# Patient Record
Sex: Female | Born: 1955 | Race: White | Hispanic: No | Smoking: Current every day smoker
Health system: Southern US, Community
[De-identification: ages and names within clinical notes are randomized; demographics above are authoritative.]

## PROBLEM LIST (undated history)

## (undated) DIAGNOSIS — I10 Essential (primary) hypertension: Secondary | ICD-10-CM

## (undated) DIAGNOSIS — I8390 Asymptomatic varicose veins of unspecified lower extremity: Secondary | ICD-10-CM

## (undated) DIAGNOSIS — N760 Acute vaginitis: Secondary | ICD-10-CM

## (undated) DIAGNOSIS — F32A Depression, unspecified: Secondary | ICD-10-CM

## (undated) DIAGNOSIS — E079 Disorder of thyroid, unspecified: Secondary | ICD-10-CM

## (undated) DIAGNOSIS — M539 Dorsopathy, unspecified: Secondary | ICD-10-CM

## (undated) DIAGNOSIS — G629 Polyneuropathy, unspecified: Secondary | ICD-10-CM

## (undated) DIAGNOSIS — J309 Allergic rhinitis, unspecified: Secondary | ICD-10-CM

## (undated) DIAGNOSIS — K5732 Diverticulitis of large intestine without perforation or abscess without bleeding: Secondary | ICD-10-CM

## (undated) DIAGNOSIS — R0609 Other forms of dyspnea: Secondary | ICD-10-CM

## (undated) DIAGNOSIS — E039 Hypothyroidism, unspecified: Secondary | ICD-10-CM

## (undated) DIAGNOSIS — R6889 Other general symptoms and signs: Secondary | ICD-10-CM

## (undated) DIAGNOSIS — R251 Tremor, unspecified: Secondary | ICD-10-CM

## (undated) DIAGNOSIS — K589 Irritable bowel syndrome without diarrhea: Secondary | ICD-10-CM

## (undated) DIAGNOSIS — M1990 Unspecified osteoarthritis, unspecified site: Secondary | ICD-10-CM

## (undated) DIAGNOSIS — J439 Emphysema, unspecified: Secondary | ICD-10-CM

## (undated) DIAGNOSIS — M797 Fibromyalgia: Secondary | ICD-10-CM

## (undated) DIAGNOSIS — S0300XA Dislocation of jaw, unspecified side, initial encounter: Secondary | ICD-10-CM

## (undated) DIAGNOSIS — M810 Age-related osteoporosis without current pathological fracture: Secondary | ICD-10-CM

## (undated) DIAGNOSIS — K5790 Diverticulosis of intestine, part unspecified, without perforation or abscess without bleeding: Secondary | ICD-10-CM

## (undated) DIAGNOSIS — G4700 Insomnia, unspecified: Secondary | ICD-10-CM

## (undated) DIAGNOSIS — K219 Gastro-esophageal reflux disease without esophagitis: Secondary | ICD-10-CM

## (undated) DIAGNOSIS — Z9989 Dependence on other enabling machines and devices: Secondary | ICD-10-CM

## (undated) DIAGNOSIS — Z973 Presence of spectacles and contact lenses: Secondary | ICD-10-CM

## (undated) DIAGNOSIS — F329 Major depressive disorder, single episode, unspecified: Secondary | ICD-10-CM

## (undated) DIAGNOSIS — Z803 Family history of malignant neoplasm of breast: Secondary | ICD-10-CM

## (undated) DIAGNOSIS — I2510 Atherosclerotic heart disease of native coronary artery without angina pectoris: Secondary | ICD-10-CM

## (undated) DIAGNOSIS — G8929 Other chronic pain: Secondary | ICD-10-CM

## (undated) DIAGNOSIS — N393 Stress incontinence (female) (male): Secondary | ICD-10-CM

## (undated) DIAGNOSIS — L309 Dermatitis, unspecified: Secondary | ICD-10-CM

## (undated) DIAGNOSIS — Z780 Asymptomatic menopausal state: Secondary | ICD-10-CM

## (undated) HISTORY — PX: REFRACTIVE SURGERY: SHX103

## (undated) HISTORY — DX: Unspecified osteoarthritis, unspecified site: M19.90

## (undated) HISTORY — DX: Hypothyroidism, unspecified: E03.9

## (undated) HISTORY — PX: ABDOMINAL HYSTERECTOMY: SHX81

## (undated) HISTORY — DX: Asymptomatic menopausal state: Z78.0

## (undated) HISTORY — DX: Tremor, unspecified: R25.1

## (undated) HISTORY — DX: Depression, unspecified: F32.A

## (undated) HISTORY — DX: Major depressive disorder, single episode, unspecified: F32.9

## (undated) HISTORY — DX: Insomnia, unspecified: G47.00

## (undated) HISTORY — DX: Family history of malignant neoplasm of breast: Z80.3

## (undated) HISTORY — DX: Atherosclerotic heart disease of native coronary artery without angina pectoris: I25.10

## (undated) HISTORY — DX: Fibromyalgia: M79.7

## (undated) HISTORY — DX: Age-related osteoporosis without current pathological fracture: M81.0

## (undated) HISTORY — DX: Disorder of thyroid, unspecified: E07.9

## (undated) HISTORY — DX: Allergic rhinitis, unspecified: J30.9

## (undated) HISTORY — DX: Acute vaginitis: N76.0

## (undated) HISTORY — PX: EYE SURGERY: SHX253

## (undated) HISTORY — DX: Dislocation of jaw, unspecified side, initial encounter: S03.00XA

## (undated) HISTORY — DX: Asymptomatic varicose veins of unspecified lower extremity: I83.90

## (undated) HISTORY — PX: VARICOSE VEIN SURGERY: SHX832

---

## 1996-02-09 HISTORY — PX: OTHER SURGICAL HISTORY: SHX169

## 2005-02-04 ENCOUNTER — Ambulatory Visit: Payer: Self-pay

## 2006-02-10 ENCOUNTER — Ambulatory Visit: Payer: Self-pay

## 2007-03-16 ENCOUNTER — Ambulatory Visit: Payer: Self-pay

## 2008-03-25 ENCOUNTER — Ambulatory Visit: Payer: Self-pay | Admitting: Obstetrics & Gynecology

## 2009-03-31 ENCOUNTER — Ambulatory Visit: Payer: Self-pay | Admitting: Obstetrics & Gynecology

## 2010-09-16 ENCOUNTER — Ambulatory Visit: Payer: Self-pay

## 2011-10-05 ENCOUNTER — Ambulatory Visit: Payer: Self-pay

## 2012-10-18 ENCOUNTER — Ambulatory Visit: Payer: Self-pay

## 2013-05-11 ENCOUNTER — Ambulatory Visit: Payer: Self-pay | Admitting: Family Medicine

## 2013-05-22 ENCOUNTER — Ambulatory Visit: Payer: Self-pay

## 2013-11-26 ENCOUNTER — Ambulatory Visit: Payer: Self-pay

## 2013-12-12 ENCOUNTER — Ambulatory Visit: Payer: Self-pay

## 2014-03-06 ENCOUNTER — Ambulatory Visit: Payer: Self-pay | Admitting: Neurology

## 2014-05-20 DIAGNOSIS — N951 Menopausal and female climacteric states: Secondary | ICD-10-CM | POA: Insufficient documentation

## 2014-05-20 DIAGNOSIS — F339 Major depressive disorder, recurrent, unspecified: Secondary | ICD-10-CM | POA: Insufficient documentation

## 2014-05-20 DIAGNOSIS — G250 Essential tremor: Secondary | ICD-10-CM | POA: Insufficient documentation

## 2014-05-20 DIAGNOSIS — M810 Age-related osteoporosis without current pathological fracture: Secondary | ICD-10-CM | POA: Insufficient documentation

## 2014-05-20 DIAGNOSIS — M797 Fibromyalgia: Secondary | ICD-10-CM | POA: Insufficient documentation

## 2014-05-20 DIAGNOSIS — G4700 Insomnia, unspecified: Secondary | ICD-10-CM

## 2014-05-20 DIAGNOSIS — R636 Underweight: Secondary | ICD-10-CM | POA: Insufficient documentation

## 2014-05-20 DIAGNOSIS — F332 Major depressive disorder, recurrent severe without psychotic features: Secondary | ICD-10-CM

## 2014-05-20 DIAGNOSIS — Z720 Tobacco use: Secondary | ICD-10-CM

## 2014-05-20 DIAGNOSIS — R87619 Unspecified abnormal cytological findings in specimens from cervix uteri: Secondary | ICD-10-CM

## 2014-05-20 DIAGNOSIS — R5383 Other fatigue: Secondary | ICD-10-CM | POA: Insufficient documentation

## 2014-05-20 DIAGNOSIS — R5382 Chronic fatigue, unspecified: Secondary | ICD-10-CM

## 2014-05-20 DIAGNOSIS — J309 Allergic rhinitis, unspecified: Secondary | ICD-10-CM

## 2014-06-26 ENCOUNTER — Other Ambulatory Visit: Payer: Self-pay | Admitting: Unknown Physician Specialty

## 2014-06-26 DIAGNOSIS — N63 Unspecified lump in unspecified breast: Secondary | ICD-10-CM

## 2014-07-04 ENCOUNTER — Ambulatory Visit
Admission: RE | Admit: 2014-07-04 | Discharge: 2014-07-04 | Disposition: A | Discharge status: Home / Self Care | Payer: No Typology Code available for payment source | Source: Ambulatory Visit | Attending: Unknown Physician Specialty | Admitting: Unknown Physician Specialty

## 2014-07-04 DIAGNOSIS — N63 Unspecified lump in unspecified breast: Secondary | ICD-10-CM

## 2014-07-18 ENCOUNTER — Ambulatory Visit (INDEPENDENT_AMBULATORY_CARE_PROVIDER_SITE_OTHER): Payer: No Typology Code available for payment source | Admitting: Family Medicine

## 2014-07-18 ENCOUNTER — Encounter: Payer: Self-pay | Admitting: Family Medicine

## 2014-07-18 VITALS — BP 116/72 | HR 83 | Temp 97.6°F | Ht 66.3 in | Wt 117.4 lb

## 2014-07-18 DIAGNOSIS — J069 Acute upper respiratory infection, unspecified: Secondary | ICD-10-CM

## 2014-07-18 DIAGNOSIS — J029 Acute pharyngitis, unspecified: Secondary | ICD-10-CM | POA: Diagnosis not present

## 2014-07-18 NOTE — Progress Notes (Signed)
BP 116/72 mmHg  Pulse 83  Temp(Src) 97.6 F (36.4 C)  Ht 5' 6.3" (1.684 m)  Wt 117 lb 6.4 oz (53.252 kg)  BMI 18.78 kg/m2  SpO2 97%  LMP  (LMP Unknown)   Subjective:    Patient ID: Diane Pearson, female    DOB: [birth year], 59 y.o.   MRN: [id number]  HPI: Diane Pearson is a 59 y.o. female presenting on 07/18/2014 for URI and Ear Pain UPPER RESPIRATORY TRACT INFECTION Worst symptom: ear pain and itching for the past 4-5 days Fever: yes 101 on Saturday Cough: yes non-productive Shortness of breath: no Wheezing: no Chest pain: no Chest tightness: no Chest congestion: no Nasal congestion: no Runny nose: yes Post nasal drip: yes Sneezing: no Sore throat: yes Swollen glands: no Sinus pressure: no Headache: no Face pain: no Toothache: yes Ear pain: yes bilateral Ear pressure: yes bilateral Eyes red/itching:no Eye drainage/crusting: no  Vomiting: no Rash: no Fatigue: yes Sick contacts: no Strep contacts: no  Context: better Recurrent sinusitis: no Relief with OTC cold/cough medications: no  Treatments attempted: anti-histamine   Relevant past medical, surgical, family and social history reviewed and updated as indicated. Interim medical history since our last visit reviewed. Allergies and medications reviewed and updated.  Current Outpatient Prescriptions on File Prior to Visit  Medication Sig  . aspirin 81 MG tablet Take 81 mg by mouth daily.  . cholecalciferol (VITAMIN D) 1000 UNITS tablet Take 1,000 Units by mouth daily.  . cyclobenzaprine (FLEXERIL) 10 MG tablet Take 10 mg by mouth 3 (three) times daily as needed for muscle spasms.  . DULoxetine (CYMBALTA) 60 MG capsule Take 60 mg by mouth daily.  Marland Kitchen estrogen, conjugated,-medroxyprogesterone (PREMPRO) 0.625-2.5 MG per tablet Take 1 tablet by mouth daily.  . hydrOXYzine (ATARAX/VISTARIL) 10 MG tablet Take 10 mg by mouth 3 (three) times daily as needed.  . metoprolol succinate (TOPROL-XL) 50 MG 24 hr tablet Take  50 mg by mouth daily. Take with or immediately following a meal.  . vitamin B-12 (CYANOCOBALAMIN) 100 MCG tablet Take 100 mcg by mouth daily.  Marland Kitchen zolpidem (AMBIEN) 10 MG tablet Take 10 mg by mouth at bedtime as needed for sleep.   No current facility-administered medications on file prior to visit.    Review of Systems  Constitutional: Negative.   HENT: Negative.   Respiratory: Negative.   Psychiatric/Behavioral: Negative.     Per HPI unless specifically indicated above     Objective:    BP 116/72 mmHg  Pulse 83  Temp(Src) 97.6 F (36.4 C)  Ht 5' 6.3" (1.684 m)  Wt 117 lb 6.4 oz (53.252 kg)  BMI 18.78 kg/m2  SpO2 97%  LMP  (LMP Unknown)  Wt Readings from Last 3 Encounters:  07/18/14 117 lb 6.4 oz (53.252 kg)  05/20/14 116 lb (52.617 kg)    Physical Exam  Constitutional: She appears well-developed and well-nourished.  HENT:  Head: Normocephalic and atraumatic.  Right Ear: Hearing, tympanic membrane, external ear and ear canal normal.  Left Ear: Hearing, tympanic membrane, external ear and ear canal normal.  Nose: Rhinorrhea present. No mucosal edema, nose lacerations, sinus tenderness, nasal deformity, septal deviation or nasal septal hematoma. No epistaxis.  No foreign bodies. Right sinus exhibits maxillary sinus tenderness. Right sinus exhibits no frontal sinus tenderness. Left sinus exhibits maxillary sinus tenderness. Left sinus exhibits no frontal sinus tenderness.  Mouth/Throat: Uvula is midline. Mucous membranes are pale, not dry and not cyanotic. No oropharyngeal exudate, posterior oropharyngeal  edema, posterior oropharyngeal erythema or tonsillar abscesses.  Slight erythema, turbinates not swollen, swollen tonsil on the R with white exudate  Eyes: Conjunctivae and EOM are normal. Pupils are equal, round, and reactive to light.  Neck: Normal range of motion. No JVD present. No tracheal deviation present. No thyromegaly present.  Cardiovascular: Normal rate, regular  rhythm, normal heart sounds and intact distal pulses.  Exam reveals no gallop and no friction rub.   No murmur heard. Pulmonary/Chest: Effort normal and breath sounds normal. No stridor. No respiratory distress. She has no wheezes. She has no rales. She exhibits no tenderness.  Lymphadenopathy:    She has cervical adenopathy.  Skin: Skin is warm and dry.  Psychiatric: She has a normal mood and affect. Her behavior is normal. Judgment and thought content normal.    No results found for this or any previous visit.    Assessment & Plan:   Problem List Items Addressed This Visit    None    Visit Diagnoses    Upper respiratory infection    -  Primary    This appears to be a viral illness. Symptomatic treatment as discussed below. Call if not getting better or getting worse.     Pharyngitis        Strep test checked today and was negative. Likely due to PND    Relevant Orders    Strep Gp A Ag, IA W/Reflex        Follow up plan: Return if symptoms worsen or fail to improve.

## 2014-07-18 NOTE — Patient Instructions (Signed)
Upper Respiratory Infection, Adult An upper respiratory infection (URI) is also sometimes known as the common cold. The upper respiratory tract includes the nose, sinuses, throat, trachea, and bronchi. Bronchi are the airways leading to the lungs. Most people improve within 1 week, but symptoms can last up to 2 weeks. A residual cough may last even longer.  CAUSES Many different viruses can infect the tissues lining the upper respiratory tract. The tissues become irritated and inflamed and often become very moist. Mucus production is also common. A cold is contagious. You can easily spread the virus to others by oral contact. This includes kissing, sharing a glass, coughing, or sneezing. Touching your mouth or nose and then touching a surface, which is then touched by another person, can also spread the virus. SYMPTOMS  Symptoms typically develop 1 to 3 days after you come in contact with a cold virus. Symptoms vary from person to person. They may include:  Runny nose.  Sneezing.  Nasal congestion.  Sinus irritation.  Sore throat.  Loss of voice (laryngitis).  Cough.  Fatigue.  Muscle aches.  Loss of appetite.  Headache.  Low-grade fever. DIAGNOSIS  You might diagnose your own cold based on familiar symptoms, since most people get a cold 2 to 3 times a year. Your caregiver can confirm this based on your exam. Most importantly, your caregiver can check that your symptoms are not due to another disease such as strep throat, sinusitis, pneumonia, asthma, or epiglottitis. Blood tests, throat tests, and X-rays are not necessary to diagnose a common cold, but they may sometimes be helpful in excluding other more serious diseases. Your caregiver will decide if any further tests are required. RISKS AND COMPLICATIONS  You may be at risk for a more severe case of the common cold if you smoke cigarettes, have chronic heart disease (such as heart failure) or lung disease (such as asthma), or if  you have a weakened immune system. The very young and very old are also at risk for more serious infections. Bacterial sinusitis, middle ear infections, and bacterial pneumonia can complicate the common cold. The common cold can worsen asthma and chronic obstructive pulmonary disease (COPD). Sometimes, these complications can require emergency medical care and may be life-threatening. PREVENTION  The best way to protect against getting a cold is to practice good hygiene. Avoid oral or hand contact with people with cold symptoms. Wash your hands often if contact occurs. There is no clear evidence that vitamin C, vitamin E, echinacea, or exercise reduces the chance of developing a cold. However, it is always recommended to get plenty of rest and practice good nutrition. TREATMENT  Treatment is directed at relieving symptoms. There is no cure. Antibiotics are not effective, because the infection is caused by a virus, not by bacteria. Treatment may include:  Increased fluid intake. Sports drinks offer valuable electrolytes, sugars, and fluids.  Breathing heated mist or steam (vaporizer or shower).  Eating chicken soup or other clear broths, and maintaining good nutrition.  Getting plenty of rest.  Using gargles or lozenges for comfort.  Controlling fevers with ibuprofen or acetaminophen as directed by your caregiver.  Increasing usage of your inhaler if you have asthma. Zinc gel and zinc lozenges, taken in the first 24 hours of the common cold, can shorten the duration and lessen the severity of symptoms. Pain medicines may help with fever, muscle aches, and throat pain. A variety of non-prescription medicines are available to treat congestion and runny nose. Your caregiver   can make recommendations and may suggest nasal or lung inhalers for other symptoms.  HOME CARE INSTRUCTIONS   Only take over-the-counter or prescription medicines for pain, discomfort, or fever as directed by your  caregiver.  Use a warm mist humidifier or inhale steam from a shower to increase air moisture. This may keep secretions moist and make it easier to breathe.  Drink enough water and fluids to keep your urine clear or pale yellow.  Rest as needed.  Return to work when your temperature has returned to normal or as your caregiver advises. You may need to stay home longer to avoid infecting others. You can also use a face mask and careful hand washing to prevent spread of the virus. SEEK MEDICAL CARE IF:   After the first few days, you feel you are getting worse rather than better.  You need your caregiver's advice about medicines to control symptoms.  You develop chills, worsening shortness of breath, or brown or red sputum. These may be signs of pneumonia.  You develop yellow or brown nasal discharge or pain in the face, especially when you bend forward. These may be signs of sinusitis.  You develop a fever, swollen neck glands, pain with swallowing, or white areas in the back of your throat. These may be signs of strep throat. SEEK IMMEDIATE MEDICAL CARE IF:   You have a fever.  You develop severe or persistent headache, ear pain, sinus pain, or chest pain.  You develop wheezing, a prolonged cough, cough up blood, or have a change in your usual mucus (if you have chronic lung disease).  You develop sore muscles or a stiff neck. Document Released: 07/21/2000 Document Revised: 04/19/2011 Document Reviewed: 05/02/2013 Exit[employer] Patient Information 2015 [employer]. This information is not intended to replace advice given to you by your health care provider. Make sure you discuss any questions you have with your health care provider.  

## 2014-07-21 LAB — STREP GP A AG, IA W/REFLEX: Strep A Culture: NEGATIVE

## 2014-08-06 ENCOUNTER — Telehealth: Payer: Self-pay | Admitting: Unknown Physician Specialty

## 2014-08-06 DIAGNOSIS — M797 Fibromyalgia: Secondary | ICD-10-CM

## 2014-08-06 NOTE — Telephone Encounter (Signed)
Called and let patient know about rheumatology labs. I also let her know that [name] is going to put in for the referral she requested.

## 2014-08-06 NOTE — Telephone Encounter (Signed)
Tried to return patient's call but there was no answer so I left a voicemail for her to return my call.

## 2014-08-06 NOTE — Telephone Encounter (Signed)
Patient returned my call. She got her results from Rheumatology and does not understand the results so she wants clarification on them. She would also like to be referred to Dr. Sandi Mealy or Dr. Helane Rima at [facility]. Patient would like a call back at [phone].

## 2014-08-06 NOTE — Telephone Encounter (Signed)
Please let pt know that she should call her rheumatologist for clarification on her labs.  They can best help her as I often don't know what rheumatology labs indicate.  OK for the referral.  I will put it in.

## 2014-08-06 NOTE — Telephone Encounter (Signed)
Pt called requests call back from [place]. Please contact pt @ [phone]. Thanks.

## 2014-08-13 ENCOUNTER — Other Ambulatory Visit: Payer: Self-pay | Admitting: Rheumatology

## 2014-08-13 DIAGNOSIS — M542 Cervicalgia: Secondary | ICD-10-CM

## 2014-08-28 ENCOUNTER — Ambulatory Visit
Admission: RE | Admit: 2014-08-28 | Discharge: 2014-08-28 | Disposition: A | Discharge status: Home / Self Care | Payer: No Typology Code available for payment source | Source: Ambulatory Visit | Attending: Rheumatology | Admitting: Rheumatology

## 2014-08-28 DIAGNOSIS — M5022 Other cervical disc displacement, mid-cervical region: Secondary | ICD-10-CM | POA: Insufficient documentation

## 2014-08-28 DIAGNOSIS — M542 Cervicalgia: Secondary | ICD-10-CM

## 2014-08-28 DIAGNOSIS — M4802 Spinal stenosis, cervical region: Secondary | ICD-10-CM | POA: Insufficient documentation

## 2014-08-28 DIAGNOSIS — M2578 Osteophyte, vertebrae: Secondary | ICD-10-CM | POA: Insufficient documentation

## 2014-09-05 ENCOUNTER — Telehealth: Payer: Self-pay | Admitting: Unknown Physician Specialty

## 2014-09-05 NOTE — Telephone Encounter (Signed)
Called and spoke to patient.

## 2014-09-05 NOTE — Telephone Encounter (Signed)
Pt called stated someone called her, would like a call back. Thanks.

## 2014-09-25 ENCOUNTER — Encounter: Payer: Self-pay | Admitting: Unknown Physician Specialty

## 2014-09-25 ENCOUNTER — Ambulatory Visit (INDEPENDENT_AMBULATORY_CARE_PROVIDER_SITE_OTHER): Payer: No Typology Code available for payment source | Admitting: Unknown Physician Specialty

## 2014-09-25 VITALS — BP 130/86 | HR 94 | Temp 98.3°F | Ht 66.0 in | Wt 117.4 lb

## 2014-09-25 DIAGNOSIS — G629 Polyneuropathy, unspecified: Secondary | ICD-10-CM | POA: Insufficient documentation

## 2014-09-25 DIAGNOSIS — M797 Fibromyalgia: Secondary | ICD-10-CM | POA: Diagnosis not present

## 2014-09-25 DIAGNOSIS — Z0000 Encounter for general adult medical examination without abnormal findings: Secondary | ICD-10-CM | POA: Diagnosis not present

## 2014-09-25 DIAGNOSIS — G250 Essential tremor: Secondary | ICD-10-CM | POA: Diagnosis not present

## 2014-09-25 NOTE — Progress Notes (Signed)
BP 130/86 mmHg  Pulse 94  Temp(Src) 98.3 F (36.8 C)  Ht 5\' 6"  (1.676 m)  Wt 117 lb 6.4 oz (53.252 kg)  BMI 18.96 kg/m2  SpO2 96%  LMP  (LMP Unknown)   Subjective:    Patient ID: Diane Pearson, female    DOB: 17-Jul-1955, 58 y.o.   MRN: [id number]  HPI: Diane Pearson is a 59 y.o. female  Chief Complaint  Patient presents with  . Annual Exam   Fibromyalgia Pt is looking for another rheumatologist.  I did review her MRI of the c-spine.  She has a list of doctors she got from the doctor who she is seeing for disability.  She is unable to work at her job due to tremors, mind fog, inability to stand for 8 hours or sit for a prolonged period of time.  Sometimes she can't walk or bend over due to back pain.  She needs rest to manage pain and tremors and unable to do that with the rigors of a full-time job.  Sometimes she can't lift stuff, she drops stuff, and trips.  Unable to see well but has seen an eye specialist.  She has her MRI of the neck showing a great deal of pathology.    Relevant past medical, surgical, family and social history reviewed and updated as indicated. Interim medical history since our last visit reviewed. Allergies and medications reviewed and updated.  Review of Systems  Constitutional: Positive for fatigue.  HENT: Negative.   Eyes: Positive for visual disturbance.  Respiratory: Negative.   Cardiovascular: Negative.   Gastrointestinal:       Diarrhea is improving  Endocrine: Positive for cold intolerance and polyuria.  Genitourinary: Negative.   Musculoskeletal: Positive for myalgias, back pain, arthralgias, neck pain and neck stiffness.  Neurological: Positive for dizziness, tremors, weakness, light-headedness and numbness.  Psychiatric/Behavioral:       Depression.  Getting frustrated easily    Per HPI unless specifically indicated above     Objective:    BP 130/86 mmHg  Pulse 94  Temp(Src) 98.3 F (36.8 C)  Ht 5\' 6"  (1.676 m)  Wt 117 lb  6.4 oz (53.252 kg)  BMI 18.96 kg/m2  SpO2 96%  LMP  (LMP Unknown)  Wt Readings from Last 3 Encounters:  09/25/14 117 lb 6.4 oz (53.252 kg)  08/28/14 117 lb (53.071 kg)  07/18/14 117 lb 6.4 oz (53.252 kg)    Physical Exam  Constitutional: She is oriented to person, place, and time. She appears well-developed and well-nourished. No distress.  HENT:  Head: Normocephalic and atraumatic.  Eyes: Conjunctivae and lids are normal. Pupils are equal, round, and reactive to light. Right eye exhibits no discharge. Left eye exhibits no discharge. No scleral icterus.  Neck: Normal range of motion. Neck supple. Carotid bruit is not present. No thyromegaly present.  Cardiovascular: Normal rate, regular rhythm and normal heart sounds.  Exam reveals no gallop and no friction rub.   No murmur heard. Pulmonary/Chest: Effort normal and breath sounds normal. No respiratory distress. She has no wheezes. She has no rales.  Abdominal: Soft. Normal appearance and bowel sounds are normal. She exhibits no distension. There is no splenomegaly or hepatomegaly. There is no tenderness. There is no rebound.  Genitourinary: No breast swelling, tenderness or discharge.  Musculoskeletal: Normal range of motion.  Lymphadenopathy:    She has no cervical adenopathy.  Neurological: She is alert and oriented to person, place, and time.  Skin: Skin is  warm, dry and intact. No rash noted. No pallor.  Psychiatric: She has a normal mood and affect. Her speech is normal and behavior is normal. Judgment and thought content normal. Cognition and memory are normal.  Nursing note and vitals reviewed.     Assessment & Plan:   Problem List Items Addressed This Visit      Unprioritized   Benign essential tremor   Fibromyalgia - Primary   Relevant Medications   ibuprofen (ADVIL,MOTRIN) 800 MG tablet   Other Relevant Orders   TSH   Vit D  25 hydroxy (rtn osteoporosis monitoring)   Neuropathy   Relevant Orders   Vit D  25  hydroxy (rtn osteoporosis monitoring)   Vitamin B12    Other Visit Diagnoses    Annual physical exam        Relevant Orders    CBC with Differential/Platelet    Comprehensive metabolic panel    Hepatitis C antibody    HIV antibody    Lipid Panel w/o Chol/HDL Ratio    TSH    Vit D  25 hydroxy (rtn osteoporosis monitoring)        Follow up plan:  Refusing colonoscopy.  Will do stool cards.   Needs pap with HR HPV in 6 months.

## 2014-09-26 ENCOUNTER — Telehealth: Payer: Self-pay | Admitting: Unknown Physician Specialty

## 2014-09-26 LAB — CBC WITH DIFFERENTIAL/PLATELET
Basophils Absolute: 0.1 10*3/uL (ref 0.0–0.2)
Basos: 1 %
EOS (ABSOLUTE): 0.4 10*3/uL (ref 0.0–0.4)
Eos: 5 %
Hematocrit: 36 % (ref 34.0–46.6)
Hemoglobin: 12.3 g/dL (ref 11.1–15.9)
Immature Grans (Abs): 0 10*3/uL (ref 0.0–0.1)
Immature Granulocytes: 0 %
Lymphocytes Absolute: 2.4 10*3/uL (ref 0.7–3.1)
Lymphs: 32 %
MCH: 32.3 pg (ref 26.6–33.0)
MCHC: 34.2 g/dL (ref 31.5–35.7)
MCV: 95 fL (ref 79–97)
Monocytes Absolute: 0.4 10*3/uL (ref 0.1–0.9)
Monocytes: 5 %
Neutrophils Absolute: 4.3 10*3/uL (ref 1.4–7.0)
Neutrophils: 57 %
Platelets: 235 10*3/uL (ref 150–379)
RBC: 3.81 x10E6/uL (ref 3.77–5.28)
RDW: 13.1 % (ref 12.3–15.4)
WBC: 7.5 10*3/uL (ref 3.4–10.8)

## 2014-09-26 LAB — COMPREHENSIVE METABOLIC PANEL
ALT: 13 IU/L (ref 0–32)
AST: 17 IU/L (ref 0–40)
Albumin/Globulin Ratio: 1.8 (ref 1.1–2.5)
Albumin: 4.2 g/dL (ref 3.5–5.5)
Alkaline Phosphatase: 73 IU/L (ref 39–117)
BUN/Creatinine Ratio: 25 — ABNORMAL HIGH (ref 9–23)
BUN: 17 mg/dL (ref 6–24)
Bilirubin Total: <0.2 mg/dL (ref 0.0–1.2)
CO2: 28 mmol/L (ref 18–29)
Calcium: 9.4 mg/dL (ref 8.7–10.2)
Chloride: 100 mmol/L (ref 97–108)
Creatinine, Ser: 0.69 mg/dL (ref 0.57–1.00)
GFR calc Af Amer: 112 mL/min/{1.73_m2} (ref >59)
GFR calc non Af Amer: 97 mL/min/{1.73_m2} (ref >59)
Globulin, Total: 2.4 g/dL (ref 1.5–4.5)
Glucose: 58 mg/dL — ABNORMAL LOW (ref 65–99)
Potassium: 4.2 mmol/L (ref 3.5–5.2)
Sodium: 140 mmol/L (ref 134–144)
Total Protein: 6.6 g/dL (ref 6.0–8.5)

## 2014-09-26 LAB — HIV ANTIBODY (ROUTINE TESTING W REFLEX): HIV Screen 4th Generation wRfx: NONREACTIVE

## 2014-09-26 LAB — HEPATITIS C ANTIBODY: Hep C Virus Ab: <0.1 s/co ratio (ref 0.0–0.9)

## 2014-09-26 LAB — VITAMIN D 25 HYDROXY (VIT D DEFICIENCY, FRACTURES): Vit D, 25-Hydroxy: 45.4 ng/mL (ref 30.0–100.0)

## 2014-09-26 LAB — LIPID PANEL W/O CHOL/HDL RATIO
Cholesterol, Total: 155 mg/dL (ref 100–199)
HDL: 40 mg/dL (ref >39)
LDL Calculated: 85 mg/dL (ref 0–99)
Triglycerides: 150 mg/dL — ABNORMAL HIGH (ref 0–149)
VLDL Cholesterol Cal: 30 mg/dL (ref 5–40)

## 2014-09-26 LAB — TSH: TSH: 2.19 u[IU]/mL (ref 0.450–4.500)

## 2014-09-26 LAB — VITAMIN B12: Vitamin B-12: 1279 pg/mL — ABNORMAL HIGH (ref 211–946)

## 2014-09-27 ENCOUNTER — Encounter: Payer: Self-pay | Admitting: Unknown Physician Specialty

## 2014-10-21 ENCOUNTER — Other Ambulatory Visit: Payer: Self-pay | Admitting: Unknown Physician Specialty

## 2014-10-21 NOTE — Telephone Encounter (Signed)
Last bp and pulse reviewed; rx approved 

## 2014-11-20 ENCOUNTER — Other Ambulatory Visit: Payer: Self-pay | Admitting: Unknown Physician Specialty

## 2014-11-20 ENCOUNTER — Other Ambulatory Visit: Payer: Self-pay

## 2014-11-20 MED ORDER — ZOLPIDEM TARTRATE 10 MG PO TABS: 10.0000 mg | ORAL_TABLET | Freq: Every evening | ORAL | Status: DC | PRN

## 2014-11-20 MED ORDER — CYCLOBENZAPRINE HCL 10 MG PO TABS: 10.0000 mg | ORAL_TABLET | Freq: Three times a day (TID) | ORAL | Status: DC | PRN

## 2014-11-20 MED ORDER — DULOXETINE HCL 60 MG PO CPEP: 60.0000 mg | ORAL_CAPSULE | Freq: Every day | ORAL | Status: DC

## 2014-11-20 NOTE — Telephone Encounter (Signed)
Patient was last seen 09/25/14.

## 2014-11-20 NOTE — Telephone Encounter (Signed)
Pt came in stated she needs refills on Duloxetine and Cyclobenzaprine. Pharm is [facility], Fax # [phone].

## 2014-11-20 NOTE — Telephone Encounter (Signed)
Patient was last seen 09/25/14 and pharmacy is [facility].

## 2014-11-27 ENCOUNTER — Other Ambulatory Visit: Payer: Self-pay | Admitting: Unknown Physician Specialty

## 2014-11-27 DIAGNOSIS — R928 Other abnormal and inconclusive findings on diagnostic imaging of breast: Secondary | ICD-10-CM

## 2015-01-06 ENCOUNTER — Other Ambulatory Visit: Payer: Self-pay | Admitting: Unknown Physician Specialty

## 2015-01-13 ENCOUNTER — Telehealth: Payer: Self-pay | Admitting: Unknown Physician Specialty

## 2015-01-13 NOTE — Telephone Encounter (Signed)
Called patient's medication into the pharmacy.

## 2015-01-13 NOTE — Telephone Encounter (Signed)
Called and left patient a voicemail letting her know that her prescription was called into her pharmacy.

## 2015-01-13 NOTE — Telephone Encounter (Signed)
Routing to provider. This was refilled on 01/06/15 and was printed but there is nothing up front. Can it be re-printed?

## 2015-01-13 NOTE — Telephone Encounter (Signed)
Patient called needing refill on her medications please call her zolpidem (AMBIEN) 10 MG tablet [phone] [place]- her pharmacy

## 2015-01-13 NOTE — Telephone Encounter (Signed)
If it is written, it can be called into the pharmacy.  Thanks

## 2015-03-03 ENCOUNTER — Ambulatory Visit (INDEPENDENT_AMBULATORY_CARE_PROVIDER_SITE_OTHER): Payer: BLUE CROSS/BLUE SHIELD | Admitting: Unknown Physician Specialty

## 2015-03-03 ENCOUNTER — Encounter: Payer: Self-pay | Admitting: Unknown Physician Specialty

## 2015-03-03 VITALS — BP 151/87 | HR 79 | Temp 97.7°F | Ht 65.5 in | Wt 136.4 lb

## 2015-03-03 DIAGNOSIS — Z8742 Personal history of other diseases of the female genital tract: Secondary | ICD-10-CM | POA: Insufficient documentation

## 2015-03-03 DIAGNOSIS — Z0000 Encounter for general adult medical examination without abnormal findings: Secondary | ICD-10-CM | POA: Diagnosis not present

## 2015-03-03 DIAGNOSIS — R928 Other abnormal and inconclusive findings on diagnostic imaging of breast: Secondary | ICD-10-CM

## 2015-03-03 DIAGNOSIS — M797 Fibromyalgia: Secondary | ICD-10-CM

## 2015-03-03 DIAGNOSIS — Z720 Tobacco use: Secondary | ICD-10-CM | POA: Diagnosis not present

## 2015-03-03 NOTE — Assessment & Plan Note (Signed)
Low dose CT ordered 

## 2015-03-03 NOTE — Assessment & Plan Note (Signed)
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  Pap plus HPV today

## 2015-03-03 NOTE — Assessment & Plan Note (Signed)
Overdue for 6 month f/u

## 2015-03-03 NOTE — Patient Instructions (Signed)
Please do call to schedule your mammogram; the number to schedule one at either [facility] or [facility] is [phone]   

## 2015-03-03 NOTE — Progress Notes (Signed)
BP 151/87 mmHg  Pulse 79  Temp(Src) 97.7 F (36.5 C)  Ht 5' 5.5" (1.664 m)  Wt 136 lb 6.4 oz (61.871 kg)  BMI 22.35 kg/m2  SpO2 95%  LMP  (LMP Unknown)   Subjective:    Patient ID: Diane Pearson, female    DOB: 1955-08-09, 60 y.o.   MRN: [id number]  HPI: Diane Pearson is a 60 y.o. female  Chief Complaint  Patient presents with  . Fibromyalgia  . other    pt is here for pap according to last OV note   Pt needs a pap smear.    Fibromyalgia Pt was under the care of a rheumatologist who recommended Lyrica but unable to get it approved until recently.  Now taking it and thinks it has helped a little. She does get a headache but finds essential oils are helping that her daughter sells.   Her tremors are "good and bad" and seem to be related to Fibromyalgia.  She is taking Flexeril at night  Reviewed notes from Rheumatology and plan is as follows:  Would start Lyrica 25 mg 2 times per day. Extending to the patient that this is a small dose and we would be likely to titrate up in the future. - For periods of aching in the knees and hands a trilevel her and gel suggested. This was prescribed. - A referral was placed for physical therapy. - We discussed important diet and lifestyle changes to benefit fibromyalgia symptoms. - Future considerations include Topamax and referral to pain psychology. The patient defers on pain psychology right now. Would start Lyrica 25 mg 2 times per day. Extending to the patient that this is a small dose and we would be likely to titrate up in the future. - For periods of aching in the knees and hands a trilevel her and gel suggested. This was prescribed. - A referral was placed for physical therapy. - We discussed important diet and lifestyle changes to benefit fibromyalgia symptoms. - Future considerations include Topamax and referral to pain psychology. The patient defers on pain psychology right now.    Unable to work due to tremors, fatigue and  pain.    Needs f/u pap.     Relevant past medical, surgical, family and social history reviewed and updated as indicated. Interim medical history since our last visit reviewed. Allergies and medications reviewed and updated.  Review of Systems  Constitutional: Positive for fatigue.       Sleeping better on 1/2 Ambien  HENT: Negative.   Eyes: Negative.   Respiratory: Negative.   Cardiovascular: Negative.   Gastrointestinal: Negative.   Musculoskeletal:       As above and having trouble with knees     Neurological:       Essential tremors  Hematological: Negative.   Psychiatric/Behavioral: Negative.     Per HPI unless specifically indicated above     Objective:    BP 151/87 mmHg  Pulse 79  Temp(Src) 97.7 F (36.5 C)  Ht 5' 5.5" (1.664 m)  Wt 136 lb 6.4 oz (61.871 kg)  BMI 22.35 kg/m2  SpO2 95%  LMP  (LMP Unknown)  Wt Readings from Last 3 Encounters:  03/03/15 136 lb 6.4 oz (61.871 kg)  09/25/14 117 lb 6.4 oz (53.252 kg)  08/28/14 117 lb (53.071 kg)    Physical Exam  Constitutional: She is oriented to person, place, and time. She appears well-developed and well-nourished.  HENT:  Head: Normocephalic and atraumatic.  Eyes:  Pupils are equal, round, and reactive to light. Right eye exhibits no discharge. Left eye exhibits no discharge. No scleral icterus.  Neck: Normal range of motion. Neck supple. Carotid bruit is not present. No thyromegaly present.  Cardiovascular: Normal rate, regular rhythm and normal heart sounds.  Exam reveals no gallop and no friction rub.   No murmur heard. Pulmonary/Chest: Effort normal and breath sounds normal. No respiratory distress. She has no wheezes. She has no rales.  Abdominal: Soft. Bowel sounds are normal. There is no tenderness. There is no rebound.  Genitourinary: No breast swelling, tenderness or discharge.  Musculoskeletal: Normal range of motion.  Lymphadenopathy:    She has no cervical adenopathy.  Neurological: She is  alert and oriented to person, place, and time.  Skin: Skin is warm, dry and intact. No rash noted.  Psychiatric: She has a normal mood and affect. Her speech is normal and behavior is normal. Judgment and thought content normal. Cognition and memory are normal.    Results for orders placed or performed in visit on 09/25/14  CBC with Differential/Platelet  Result Value Ref Range   WBC 7.5 3.4 - 10.8 x10E3/uL   RBC 3.81 3.77 - 5.28 x10E6/uL   Hemoglobin 12.3 11.1 - 15.9 g/dL   Hematocrit 36.0 34.0 - 46.6 %   MCV 95 79 - 97 fL   MCH 32.3 26.6 - 33.0 pg   MCHC 34.2 31.5 - 35.7 g/dL   RDW 13.1 12.3 - 15.4 %   Platelets 235 150 - 379 x10E3/uL   Neutrophils 57 %   Lymphs 32 %   Monocytes 5 %   Eos 5 %   Basos 1 %   Neutrophils Absolute 4.3 1.4 - 7.0 x10E3/uL   Lymphocytes Absolute 2.4 0.7 - 3.1 x10E3/uL   Monocytes Absolute 0.4 0.1 - 0.9 x10E3/uL   EOS (ABSOLUTE) 0.4 0.0 - 0.4 x10E3/uL   Basophils Absolute 0.1 0.0 - 0.2 x10E3/uL   Immature Granulocytes 0 %   Immature Grans (Abs) 0.0 0.0 - 0.1 x10E3/uL  Comprehensive metabolic panel  Result Value Ref Range   Glucose 58 (L) 65 - 99 mg/dL   BUN 17 6 - 24 mg/dL   Creatinine, Ser 0.69 0.57 - 1.00 mg/dL   GFR calc non Af Amer 97 >59 mL/min/1.73   GFR calc Af Amer 112 >59 mL/min/1.73   BUN/Creatinine Ratio 25 (H) 9 - 23   Sodium 140 134 - 144 mmol/L   Potassium 4.2 3.5 - 5.2 mmol/L   Chloride 100 97 - 108 mmol/L   CO2 28 18 - 29 mmol/L   Calcium 9.4 8.7 - 10.2 mg/dL   Total Protein 6.6 6.0 - 8.5 g/dL   Albumin 4.2 3.5 - 5.5 g/dL   Globulin, Total 2.4 1.5 - 4.5 g/dL   Albumin/Globulin Ratio 1.8 1.1 - 2.5   Bilirubin Total <0.2 0.0 - 1.2 mg/dL   Alkaline Phosphatase 73 39 - 117 IU/L   AST 17 0 - 40 IU/L   ALT 13 0 - 32 IU/L  Hepatitis C antibody  Result Value Ref Range   Hep C Virus Ab <0.1 0.0 - 0.9 s/co ratio  HIV antibody  Result Value Ref Range   HIV Screen 4th Generation wRfx Non Reactive Non Reactive  Lipid Panel w/o  Chol/HDL Ratio  Result Value Ref Range   Cholesterol, Total 155 100 - 199 mg/dL   Triglycerides 150 (H) 0 - 149 mg/dL   HDL 40 >39 mg/dL   VLDL Cholesterol  Cal 30 5 - 40 mg/dL   LDL Calculated 85 0 - 99 mg/dL  TSH  Result Value Ref Range   TSH 2.190 0.450 - 4.500 uIU/mL  Vit D  25 hydroxy (rtn osteoporosis monitoring)  Result Value Ref Range   Vit D, 25-Hydroxy 45.4 30.0 - 100.0 ng/mL  Vitamin B12  Result Value Ref Range   Vitamin B-12 1279 (H) 211 - 946 pg/mL      Assessment & Plan:   Problem List Items Addressed This Visit      Unprioritized   Fibromyalgia    Still a major problem and unable to work due to symptoms.  She has also been evaluated by a rheumatologist.  I'd like to see how the lyriica works before making any changes.  Only on 25 mg TID and will consider increasing after another month      Tobacco use    Low dose CT ordered       History of abnormal cervical Pap smear    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  Pap plus HPV today      Abnormal mammogram    Overdue for 6 month f/u       Other Visit Diagnoses    Routine general medical examination at a health care facility    -  Primary    Relevant Orders    IGP, Aptima HPV, rfx 16/18,45       Pt is refusing PT for now and wants to try the chiropractor  Follow up plan: Return in about 1 month (around 04/03/2015). to consider increasing Lyrica

## 2015-03-03 NOTE — Assessment & Plan Note (Addendum)
Still a major problem and unable to work due to symptoms.  She has also been evaluated by a rheumatologist.  I'd like to see how the lyriica works before making any changes.  Only on 25 mg TID and will consider increasing after another month

## 2015-03-04 ENCOUNTER — Telehealth: Payer: Self-pay | Admitting: *Deleted

## 2015-03-04 NOTE — Telephone Encounter (Signed)
Received referral for low dose lung cancer screening CT scan from [facility] . Voicemail left at phone number listed in EMR for patient to call me back to facilitate scheduling scan.

## 2015-03-06 ENCOUNTER — Other Ambulatory Visit: Payer: Self-pay | Admitting: Unknown Physician Specialty

## 2015-03-06 DIAGNOSIS — Z1231 Encounter for screening mammogram for malignant neoplasm of breast: Secondary | ICD-10-CM

## 2015-03-08 LAB — IGP, APTIMA HPV, RFX 16/18,45
HPV Aptima: NEGATIVE
PAP Smear Comment: 0

## 2015-03-11 ENCOUNTER — Telehealth: Payer: Self-pay

## 2015-03-11 NOTE — Telephone Encounter (Signed)
Called and let patient know what Cheryl said.  

## 2015-03-11 NOTE — Telephone Encounter (Signed)
She will need to ask her insurance that ?Marland Kitchen  Typically not

## 2015-03-11 NOTE — Telephone Encounter (Signed)
Called patient to let her know about pap results. Patient asked about going to a therapist for her fibromyalgia. She stated she has done this in the past and wants to know if she needs a new referral.

## 2015-03-14 ENCOUNTER — Telehealth: Payer: Self-pay | Admitting: Unknown Physician Specialty

## 2015-03-14 NOTE — Telephone Encounter (Signed)
Called and left patient a voicemail letting her know that her prescription was printed and can be picked up and taken to her pharmacy to get filled.

## 2015-03-14 NOTE — Telephone Encounter (Signed)
Pt called to inquire to about refill on Ambien. Thanks.

## 2015-03-15 ENCOUNTER — Other Ambulatory Visit: Payer: Self-pay | Admitting: Unknown Physician Specialty

## 2015-03-17 ENCOUNTER — Telehealth: Payer: Self-pay | Admitting: *Deleted

## 2015-03-17 NOTE — Telephone Encounter (Signed)
Received referral for low dose lung cancer screening CT scan from [facility]. Spoke with patient at length discussing the purpose of and the details of low dose screening scan. Patient will think about having it done and will contact me in the future if she decides to have screening scan.

## 2015-03-18 ENCOUNTER — Ambulatory Visit
Admission: RE | Admit: 2015-03-18 | Discharge: 2015-03-18 | Disposition: A | Discharge status: Home / Self Care | Payer: BLUE CROSS/BLUE SHIELD | Source: Ambulatory Visit | Attending: Unknown Physician Specialty | Admitting: Unknown Physician Specialty

## 2015-03-18 DIAGNOSIS — Z1231 Encounter for screening mammogram for malignant neoplasm of breast: Secondary | ICD-10-CM | POA: Diagnosis not present

## 2015-03-28 ENCOUNTER — Ambulatory Visit: Payer: No Typology Code available for payment source | Admitting: Unknown Physician Specialty

## 2015-03-31 ENCOUNTER — Other Ambulatory Visit: Payer: Self-pay | Admitting: Unknown Physician Specialty

## 2015-04-04 ENCOUNTER — Ambulatory Visit (INDEPENDENT_AMBULATORY_CARE_PROVIDER_SITE_OTHER): Payer: BLUE CROSS/BLUE SHIELD | Admitting: Unknown Physician Specialty

## 2015-04-04 ENCOUNTER — Encounter: Payer: Self-pay | Admitting: Unknown Physician Specialty

## 2015-04-04 VITALS — BP 146/87 | HR 80 | Temp 98.4°F | Ht 65.7 in | Wt 134.6 lb

## 2015-04-04 DIAGNOSIS — M797 Fibromyalgia: Secondary | ICD-10-CM

## 2015-04-04 MED ORDER — PREGABALIN 50 MG PO CAPS: 50.0000 mg | ORAL_CAPSULE | Freq: Two times a day (BID) | ORAL | Status: DC

## 2015-04-04 NOTE — Assessment & Plan Note (Signed)
Increase Lyrica to 50 mg TID.

## 2015-04-04 NOTE — Progress Notes (Signed)
   BP 146/87 mmHg  Pulse 80  Temp(Src) 98.4 F (36.9 C)  Ht 5' 5.7" (1.669 m)  Wt 134 lb 9.6 oz (61.054 kg)  BMI 21.92 kg/m2  SpO2 97%  LMP  (LMP Unknown)   Subjective:    Patient ID: Diane Pearson, female    DOB: 1955/08/18, 60 y.o.   MRN: [id number]  HPI: Diane Pearson is a 60 y.o. female  Chief Complaint  Patient presents with  . Fibromyalgia   Fibromyalgia Pt is here to f/u of her Fibromyalgia.  She is using Lyrical 25 mg TID.  She states it does help her with her generalized pain from the waist up and toes.  Side effects of Lyrica:  She is off balance sometimes in the AM but that seems to be getting better.  It does give her heartburn.  She is complaining of cracking of her fingers and some vaginal dryness and wonders if that is a side-effect.  She would like to go up on the dose despite these side-effects.     Relevant past medical, surgical, family and social history reviewed and updated as indicated. Interim medical history since our last visit reviewed. Allergies and medications reviewed and updated.  Review of Systems  Per HPI unless specifically indicated above     Objective:    BP 146/87 mmHg  Pulse 80  Temp(Src) 98.4 F (36.9 C)  Ht 5' 5.7" (1.669 m)  Wt 134 lb 9.6 oz (61.054 kg)  BMI 21.92 kg/m2  SpO2 97%  LMP  (LMP Unknown)  Wt Readings from Last 3 Encounters:  04/04/15 134 lb 9.6 oz (61.054 kg)  03/03/15 136 lb 6.4 oz (61.871 kg)  09/25/14 117 lb 6.4 oz (53.252 kg)    Physical Exam  Constitutional: She is oriented to person, place, and time. She appears well-developed and well-nourished. No distress.  HENT:  Head: Normocephalic and atraumatic.  Eyes: Conjunctivae and lids are normal. Right eye exhibits no discharge. Left eye exhibits no discharge. No scleral icterus.  Cardiovascular: Normal rate.   Pulmonary/Chest: Effort normal.  Abdominal: Normal appearance. There is no splenomegaly or hepatomegaly.  Musculoskeletal: Normal range of motion.   Neurological: She is alert and oriented to person, place, and time.  Skin: Skin is intact. No rash noted. No pallor.  Psychiatric: She has a normal mood and affect. Her behavior is normal. Judgment and thought content normal.      Assessment & Plan:   Problem List Items Addressed This Visit      Unprioritized   Fibromyalgia - Primary    Increase Lyrica to 50 mg TID.           Pt ed to gradually increase dose.    Follow up plan: Return in about 6 weeks (around 05/16/2015).

## 2015-04-07 ENCOUNTER — Telehealth: Payer: Self-pay | Admitting: Unknown Physician Specialty

## 2015-04-07 NOTE — Telephone Encounter (Signed)
Patient called stating that her hydrOXYzine (ATARAX/VISTARIL) 10 MG tablet is not 10 mg, that the bottle says its 25 mg, if we can update that information for when she needs to refill it is correct, thanks. Patient also stated that her lyrica has had her depress and wants to know what to do? If either change it or or add more mg to her DULoxetine (CYMBALTA) 60 MG capsule, thanks.

## 2015-04-07 NOTE — Telephone Encounter (Signed)
Called patient and let her know what [name] said about medications. Patient stated she would increase the lyrica.

## 2015-04-07 NOTE — Telephone Encounter (Signed)
Called and left patient a voicemail asking for her to please return my call.  

## 2015-04-07 NOTE — Telephone Encounter (Signed)
Hydroxyzine medication updated in patient's chart. Now, routing to provider for advice on lyrica.

## 2015-04-07 NOTE — Telephone Encounter (Signed)
She can't increase her Duloxetine.  I would ask her to just gradually increase Lyrica.  If it still causes depression, go to her previously tolerated dose

## 2015-04-07 NOTE — Telephone Encounter (Signed)
Pt returned call and would like a call back.   °

## 2015-04-10 ENCOUNTER — Telehealth: Payer: Self-pay | Admitting: Unknown Physician Specialty

## 2015-04-10 NOTE — Telephone Encounter (Signed)
Called and spoke to patient. She stated that she never got the prescription for Lyrica when she was here on 04/04/15. Since the medication was in the chart, I called it in to the pharmacy for her and I let her know I was going to do this.

## 2015-04-10 NOTE — Telephone Encounter (Signed)
Pt called stated Diane Pearson changed her Lyrica. Pt stated the pharmacy has not received the RX for Lyrica. Pharm is [facility] in [place]. Please call pt if this medication needs to be picked up. Thanks.

## 2015-05-12 ENCOUNTER — Other Ambulatory Visit: Payer: Self-pay | Admitting: Unknown Physician Specialty

## 2015-05-12 NOTE — Telephone Encounter (Signed)
Your patient 

## 2015-05-15 ENCOUNTER — Other Ambulatory Visit: Payer: Self-pay | Admitting: Unknown Physician Specialty

## 2015-05-16 ENCOUNTER — Ambulatory Visit (INDEPENDENT_AMBULATORY_CARE_PROVIDER_SITE_OTHER): Payer: BLUE CROSS/BLUE SHIELD | Admitting: Unknown Physician Specialty

## 2015-05-16 ENCOUNTER — Encounter: Payer: Self-pay | Admitting: Unknown Physician Specialty

## 2015-05-16 ENCOUNTER — Other Ambulatory Visit: Payer: Self-pay | Admitting: Unknown Physician Specialty

## 2015-05-16 VITALS — BP 145/84 | HR 76 | Temp 98.3°F | Ht 65.5 in | Wt 139.0 lb

## 2015-05-16 DIAGNOSIS — M797 Fibromyalgia: Secondary | ICD-10-CM

## 2015-05-16 MED ORDER — PREGABALIN 75 MG PO CAPS: 75.0000 mg | ORAL_CAPSULE | Freq: Three times a day (TID) | ORAL | Status: DC

## 2015-05-16 NOTE — Progress Notes (Signed)
   BP 145/84 mmHg  Pulse 76  Temp(Src) 98.3 F (36.8 C)  Ht 5' 5.5" (1.664 m)  Wt 139 lb (63.05 kg)  BMI 22.77 kg/m2  SpO2 98%  LMP  (LMP Unknown)   Subjective:    Patient ID: Diane Pearson, female    DOB: [birth year], 60 y.o.   MRN: [id number]  HPI: Diane Pearson is a 60 y.o. female  Chief Complaint  Patient presents with  . Fibromyalgia    following up, increased Lyrica at last appointment   Fibromyalgia: Patient is here for follow up after increasing Lyrica dose. States that she thinks the Lyrica is not working as well as it was before, however, does think the side effects have subsided somewhat. Takes Lyrica and Flexeril morning and night. Patient has recently been under a lot of stress at home and wonders if this may be affecting her pain.   Relevant past medical, surgical, family and social history reviewed and updated as indicated. Interim medical history since our last visit reviewed. Allergies and medications reviewed and updated.  Review of Systems  Constitutional: Negative.   HENT: Negative.   Eyes: Negative.   Respiratory: Negative.   Cardiovascular: Negative.   Gastrointestinal: Negative.   Endocrine: Negative.   Genitourinary: Negative.   Musculoskeletal: Negative.   Skin: Negative.   Allergic/Immunologic: Negative.   Neurological: Negative.   Hematological: Negative.   Psychiatric/Behavioral: Negative.     Per HPI unless specifically indicated above     Objective:    BP 145/84 mmHg  Pulse 76  Temp(Src) 98.3 F (36.8 C)  Ht 5' 5.5" (1.664 m)  Wt 139 lb (63.05 kg)  BMI 22.77 kg/m2  SpO2 98%  LMP  (LMP Unknown)  Wt Readings from Last 3 Encounters:  05/16/15 139 lb (63.05 kg)  04/04/15 134 lb 9.6 oz (61.054 kg)  03/03/15 136 lb 6.4 oz (61.871 kg)    Physical Exam  Constitutional: She is oriented to person, place, and time. She appears well-developed and well-nourished. No distress.  HENT:  Head: Normocephalic and atraumatic.  Eyes:  Conjunctivae and lids are normal. Right eye exhibits no discharge. Left eye exhibits no discharge. No scleral icterus.  Cardiovascular: Normal rate.   Pulmonary/Chest: Effort normal.  Abdominal: Normal appearance. There is no splenomegaly or hepatomegaly.  Musculoskeletal: Normal range of motion.  Neurological: She is alert and oriented to person, place, and time.  Skin: Skin is intact. No rash noted. No pallor.  Psychiatric: She has a normal mood and affect. Her behavior is normal. Judgment and thought content normal.     Assessment & Plan:   Problem List Items Addressed This Visit      Unprioritized   Fibromyalgia - Primary    Increase Lyrica to 75 mg TID.          Follow up plan: Return in about 6 weeks (around 06/27/2015).

## 2015-05-16 NOTE — Assessment & Plan Note (Signed)
Increase Lyrica to 75 mg TID.

## 2015-05-21 ENCOUNTER — Telehealth: Payer: Self-pay | Admitting: Unknown Physician Specialty

## 2015-05-21 MED ORDER — METRONIDAZOLE 0.75 % VA GEL: 1.0000 | Freq: Two times a day (BID) | VAGINAL | Status: DC

## 2015-05-21 NOTE — Telephone Encounter (Signed)
Patient returned call and stated that she is having a "fishy smell". She states that in the past she has been given metrogel, she stated it comes in a tube with 5 applicators. She would like it sent to [facility].

## 2015-05-21 NOTE — Telephone Encounter (Signed)
Called and left patient a voicemail asking for her to please return my call. I need to know what kind of symptoms she is having.

## 2015-05-21 NOTE — Telephone Encounter (Signed)
Pt would like something called in to [name] for [facility]

## 2015-06-06 ENCOUNTER — Other Ambulatory Visit: Payer: Self-pay | Admitting: Unknown Physician Specialty

## 2015-06-11 ENCOUNTER — Other Ambulatory Visit: Payer: Self-pay | Admitting: Unknown Physician Specialty

## 2015-06-11 MED ORDER — METOPROLOL SUCCINATE ER 50 MG PO TB24: 50.0000 mg | ORAL_TABLET | Freq: Every day | ORAL | Status: DC

## 2015-06-11 NOTE — Telephone Encounter (Signed)
Routing to provider. Patient was seen 05/16/15 and has appointment 06/27/15.

## 2015-06-11 NOTE — Telephone Encounter (Signed)
Pt called stated she needs a refill on Metoprolol. Pharm is [facility] in  [place], [place]. Pt stated she is getting ready to go out of time and she needs this medication ASAP. Only has 4 pills left. Thanks.

## 2015-06-16 ENCOUNTER — Other Ambulatory Visit: Payer: Self-pay | Admitting: Unknown Physician Specialty

## 2015-06-17 ENCOUNTER — Other Ambulatory Visit: Payer: Self-pay | Admitting: Unknown Physician Specialty

## 2015-06-18 ENCOUNTER — Other Ambulatory Visit: Payer: Self-pay | Admitting: Unknown Physician Specialty

## 2015-06-19 ENCOUNTER — Other Ambulatory Visit: Payer: Self-pay | Admitting: Unknown Physician Specialty

## 2015-06-21 ENCOUNTER — Other Ambulatory Visit: Payer: Self-pay | Admitting: Unknown Physician Specialty

## 2015-06-27 ENCOUNTER — Ambulatory Visit (INDEPENDENT_AMBULATORY_CARE_PROVIDER_SITE_OTHER): Payer: BLUE CROSS/BLUE SHIELD | Admitting: Unknown Physician Specialty

## 2015-06-27 ENCOUNTER — Encounter: Payer: Self-pay | Admitting: Unknown Physician Specialty

## 2015-06-27 VITALS — BP 132/85 | HR 85 | Temp 98.0°F | Ht 65.6 in | Wt 140.2 lb

## 2015-06-27 DIAGNOSIS — M797 Fibromyalgia: Secondary | ICD-10-CM

## 2015-06-27 DIAGNOSIS — G4700 Insomnia, unspecified: Secondary | ICD-10-CM

## 2015-06-27 MED ORDER — PREGABALIN 75 MG PO CAPS: 75.0000 mg | ORAL_CAPSULE | Freq: Three times a day (TID) | ORAL | Status: DC

## 2015-06-27 NOTE — Assessment & Plan Note (Signed)
Stable, continue present medications.  Due to Fibromyalgia.  Not titrating down

## 2015-06-27 NOTE — Progress Notes (Signed)
   BP 132/85 mmHg  Pulse 85  Temp(Src) 98 F (36.7 C)  Ht 5' 5.6" (1.666 m)  Wt 140 lb 3.2 oz (63.594 kg)  BMI 22.91 kg/m2  SpO2 98%  LMP  (LMP Unknown)   Subjective:    Patient ID: Diane Pearson, female    DOB: Apr 01, 1955, 60 y.o.   MRN: [id number]  HPI: Diane Pearson is a 60 y.o. female  Chief Complaint  Patient presents with  . Fibromyalgia    6 week follow up   Fibromyalgia Orders was to increase the Lyrica to 75 mg BID.  She is taking 50 mg TID.  States it is working well.  She went to the specialist in [facility] and was recommended to go to the Chiropractor which really seems to be helpful for her lower back and knees.  Feels she needs to take the Ambien nightly.  Takes Flexeril up to TID.    Relevant past medical, surgical, family and social history reviewed and updated as indicated. Interim medical history since our last visit reviewed. Allergies and medications reviewed and updated.  Review of Systems  Constitutional: Positive for fatigue.  HENT: Negative.   Respiratory: Negative.   Cardiovascular: Negative.   Gastrointestinal:       Rectal bleeding for 3 days following a period of constipation    Per HPI unless specifically indicated above     Objective:    BP 132/85 mmHg  Pulse 85  Temp(Src) 98 F (36.7 C)  Ht 5' 5.6" (1.666 m)  Wt 140 lb 3.2 oz (63.594 kg)  BMI 22.91 kg/m2  SpO2 98%  LMP  (LMP Unknown)  Wt Readings from Last 3 Encounters:  06/27/15 140 lb 3.2 oz (63.594 kg)  05/16/15 139 lb (63.05 kg)  04/04/15 134 lb 9.6 oz (61.054 kg)    Physical Exam  Constitutional: She is oriented to person, place, and time. She appears well-developed and well-nourished. No distress.  HENT:  Head: Normocephalic and atraumatic.  Eyes: Conjunctivae and lids are normal. Right eye exhibits no discharge. Left eye exhibits no discharge. No scleral icterus.  Cardiovascular: Normal rate.   Pulmonary/Chest: Effort normal.  Abdominal: Normal appearance. There  is no splenomegaly or hepatomegaly.  Musculoskeletal: Normal range of motion.  Neurological: She is alert and oriented to person, place, and time.  Skin: Skin is intact. No rash noted. No pallor.  Psychiatric: She has a normal mood and affect. Her behavior is normal. Judgment and thought content normal.  Nursing note and vitals reviewed.     Assessment & Plan:   Problem List Items Addressed This Visit      Unprioritized   Fibromyalgia - Primary    Continue titrating Lyrica up      Insomnia    Stable, continue present medications.  Due to Fibromyalgia.  Not titrating down          Follow up plan: Return in about 3 months (around 09/27/2015) for physical.

## 2015-06-27 NOTE — Assessment & Plan Note (Signed)
Continue titrating Lyrica up

## 2015-07-08 ENCOUNTER — Other Ambulatory Visit: Payer: Self-pay | Admitting: Unknown Physician Specialty

## 2015-08-07 ENCOUNTER — Other Ambulatory Visit: Payer: Self-pay | Admitting: Unknown Physician Specialty

## 2015-08-07 NOTE — Telephone Encounter (Signed)
Routing to provider  

## 2015-08-07 NOTE — Telephone Encounter (Signed)
Pt called stated she needs a refill on Cyclobenzaprine and Ambien. [facility] in [place]. Thanks.

## 2015-09-09 ENCOUNTER — Encounter (INDEPENDENT_AMBULATORY_CARE_PROVIDER_SITE_OTHER): Payer: Self-pay

## 2015-09-29 ENCOUNTER — Encounter: Payer: Self-pay | Admitting: Unknown Physician Specialty

## 2015-09-29 ENCOUNTER — Ambulatory Visit (INDEPENDENT_AMBULATORY_CARE_PROVIDER_SITE_OTHER): Payer: BLUE CROSS/BLUE SHIELD | Admitting: Unknown Physician Specialty

## 2015-09-29 VITALS — BP 131/85 | HR 80 | Temp 98.2°F | Ht 66.3 in | Wt 148.0 lb

## 2015-09-29 DIAGNOSIS — M797 Fibromyalgia: Secondary | ICD-10-CM | POA: Diagnosis not present

## 2015-09-29 DIAGNOSIS — Z0000 Encounter for general adult medical examination without abnormal findings: Secondary | ICD-10-CM

## 2015-09-29 DIAGNOSIS — G4700 Insomnia, unspecified: Secondary | ICD-10-CM | POA: Diagnosis not present

## 2015-09-29 DIAGNOSIS — R5382 Chronic fatigue, unspecified: Secondary | ICD-10-CM | POA: Diagnosis not present

## 2015-09-29 MED ORDER — ZOLPIDEM TARTRATE 10 MG PO TABS: 10.0000 mg | ORAL_TABLET | Freq: Every evening | ORAL | 5 refills | Status: DC | PRN

## 2015-09-29 NOTE — Assessment & Plan Note (Signed)
Stable, continue present medications.   

## 2015-09-29 NOTE — Progress Notes (Signed)
   BP 131/85 (BP Location: Left Arm, Patient Position: Sitting, Cuff Size: Normal)   Pulse 80   Temp 98.2 F (36.8 C)   Ht 5' 6.3" (1.684 m)   Wt 148 lb (67.1 kg)   LMP  (LMP Unknown)   SpO2 95%   BMI 23.67 kg/m    Subjective:    Patient ID: Diane Pearson, female    DOB: [birth year], 60 y.o.   MRN: [id number]  HPI: Diane Pearson is a 60 y.o. female  No chief complaint on file.   Relevant past medical, surgical, family and social history reviewed and updated as indicated. Interim medical history since our last visit reviewed. Allergies and medications reviewed and updated.  Review of Systems  Per HPI unless specifically indicated above     Objective:    BP 131/85 (BP Location: Left Arm, Patient Position: Sitting, Cuff Size: Normal)   Pulse 80   Temp 98.2 F (36.8 C)   Ht 5' 6.3" (1.684 m)   Wt 148 lb (67.1 kg)   LMP  (LMP Unknown)   SpO2 95%   BMI 23.67 kg/m   Wt Readings from Last 3 Encounters:  09/29/15 148 lb (67.1 kg)  06/27/15 140 lb 3.2 oz (63.6 kg)  05/16/15 139 lb (63 kg)    Physical Exam  Results for orders placed or performed in visit on 03/03/15  IGP, Aptima HPV, rfx 16/18,45  Result Value Ref Range   DIAGNOSIS: Comment    Specimen adequacy: Comment    CLINICIAN PROVIDED ICD10: Comment    Performed by: Comment    PAP SMEAR COMMENT .    Note: Comment    Test Methodology Comment    HPV Aptima Negative Negative      Assessment & Plan:   Problem List Items Addressed This Visit    None    Visit Diagnoses   None.      Follow up plan: No Follow-up on file.

## 2015-09-29 NOTE — Progress Notes (Signed)
BP 131/85 (BP Location: Left Arm, Patient Position: Sitting, Cuff Size: Normal)   Pulse 80   Temp 98.2 F (36.8 C)   Ht 5' 6.3" (1.684 m)   Wt 148 lb (67.1 kg)   LMP  (LMP Unknown)   SpO2 95%   BMI 23.67 kg/m    Subjective:    Patient ID: Diane Pearson, female    DOB: 11-18-1955, 60 y.o.   MRN: [id number]  HPI: Diane Pearson is a 60 y.o. female  Chief Complaint  Patient presents with  . Annual Exam    pt states she has been getting exhausted a lot lately and sleeping more because of it    Fibromyalgia Orders was to increase the Lyrica to 75 mg BID.  She is taking 50 mg TID.  States it is working well.  She went to the specialist in [facility] and was recommended to go to the Chiropractor which really seems to be helpful for her lower back and knees.  Feels she needs to take the Ambien nightly.  Takes Flexeril up to TID. Using Lidocaine on occasion.  Bilateral knee pain not helped with injections.    Fatigue States she is sleeping too little or too much.  Sometimes she will sleep for a couple of days.  She does take Ambien at night and is unwilling to stop at this time.  States insomnia will come and go.  She is not exercising but working with a Restaurant manager, fast food and getting stronger.  He has given her some strengthening exercises.    Beginning to take K, MG, and CA supplements.      Relevant past medical, surgical, family and social history reviewed and updated as indicated. Interim medical history since our last visit reviewed. Allergies and medications reviewed and updated.  Review of Systems  Constitutional: Positive for fatigue.  HENT: Negative.   Eyes: Negative.   Respiratory: Negative.   Cardiovascular: Negative.   Gastrointestinal: Negative.   Endocrine: Negative.   Genitourinary: Negative.   Musculoskeletal: Positive for arthralgias, back pain and myalgias.  Allergic/Immunologic: Negative.   Neurological: Negative.   Hematological: Negative.     Psychiatric/Behavioral:       Mood is stable.      Per HPI unless specifically indicated above     Objective:    BP 131/85 (BP Location: Left Arm, Patient Position: Sitting, Cuff Size: Normal)   Pulse 80   Temp 98.2 F (36.8 C)   Ht 5' 6.3" (1.684 m)   Wt 148 lb (67.1 kg)   LMP  (LMP Unknown)   SpO2 95%   BMI 23.67 kg/m   Wt Readings from Last 3 Encounters:  09/29/15 148 lb (67.1 kg)  06/27/15 140 lb 3.2 oz (63.6 kg)  05/16/15 139 lb (63 kg)    Physical Exam  Constitutional: She is oriented to person, place, and time. She appears well-developed and well-nourished.  HENT:  Head: Normocephalic and atraumatic.  Eyes: Pupils are equal, round, and reactive to light. Right eye exhibits no discharge. Left eye exhibits no discharge. No scleral icterus.  Neck: Normal range of motion. Neck supple. Carotid bruit is not present. No thyromegaly present.  Cardiovascular: Normal rate, regular rhythm and normal heart sounds.  Exam reveals no gallop and no friction rub.   No murmur heard. Pulmonary/Chest: Effort normal and breath sounds normal. No respiratory distress. She has no wheezes. She has no rales.  Abdominal: Soft. Bowel sounds are normal. There is no tenderness. There is  no rebound.  Genitourinary: No breast swelling, tenderness or discharge.  Musculoskeletal: Normal range of motion.  Lymphadenopathy:    She has no cervical adenopathy.  Neurological: She is alert and oriented to person, place, and time.  Skin: Skin is warm, dry and intact. No rash noted.  Psychiatric: She has a normal mood and affect. Her speech is normal and behavior is normal. Judgment and thought content normal. Cognition and memory are normal.    Results for orders placed or performed in visit on 03/03/15  IGP, Aptima HPV, rfx 16/18,45  Result Value Ref Range   DIAGNOSIS: Comment    Specimen adequacy: Comment    CLINICIAN PROVIDED ICD10: Comment    Performed by: Comment    PAP SMEAR COMMENT .     Note: Comment    Test Methodology Comment    HPV Aptima Negative Negative      Assessment & Plan:   Problem List Items Addressed This Visit      Unprioritized   Fatigue   Fibromyalgia    Stable, continue present medications.        Insomnia    Stable, continue present medications.         Other Visit Diagnoses    Annual physical exam    -  Primary   Relevant Orders   Comprehensive metabolic panel   CBC with Differential/Platelet   Lipid Panel w/o Chol/HDL Ratio   TSH    ++----------------------------   Follow up plan: Return in about 6 months (around 03/31/2016).

## 2015-09-30 ENCOUNTER — Encounter: Payer: Self-pay | Admitting: Unknown Physician Specialty

## 2015-09-30 LAB — CBC WITH DIFFERENTIAL/PLATELET
Basophils Absolute: 0.1 10*3/uL (ref 0.0–0.2)
Basos: 1 %
EOS (ABSOLUTE): 0.3 10*3/uL (ref 0.0–0.4)
Eos: 4 %
Hematocrit: 33.8 % — ABNORMAL LOW (ref 34.0–46.6)
Hemoglobin: 11 g/dL — ABNORMAL LOW (ref 11.1–15.9)
Immature Grans (Abs): 0 10*3/uL (ref 0.0–0.1)
Immature Granulocytes: 0 %
Lymphocytes Absolute: 2.3 10*3/uL (ref 0.7–3.1)
Lymphs: 29 %
MCH: 30.7 pg (ref 26.6–33.0)
MCHC: 32.5 g/dL (ref 31.5–35.7)
MCV: 94 fL (ref 79–97)
Monocytes Absolute: 0.4 10*3/uL (ref 0.1–0.9)
Monocytes: 6 %
Neutrophils Absolute: 4.8 10*3/uL (ref 1.4–7.0)
Neutrophils: 60 %
Platelets: 241 10*3/uL (ref 150–379)
RBC: 3.58 x10E6/uL — ABNORMAL LOW (ref 3.77–5.28)
RDW: 14 % (ref 12.3–15.4)
WBC: 7.9 10*3/uL (ref 3.4–10.8)

## 2015-09-30 LAB — LIPID PANEL W/O CHOL/HDL RATIO
Cholesterol, Total: 169 mg/dL (ref 100–199)
HDL: 52 mg/dL (ref >39)
LDL Calculated: 99 mg/dL (ref 0–99)
Triglycerides: 92 mg/dL (ref 0–149)
VLDL Cholesterol Cal: 18 mg/dL (ref 5–40)

## 2015-09-30 LAB — COMPREHENSIVE METABOLIC PANEL
ALT: 13 IU/L (ref 0–32)
AST: 17 IU/L (ref 0–40)
Albumin/Globulin Ratio: 1.5 (ref 1.2–2.2)
Albumin: 3.7 g/dL (ref 3.5–5.5)
Alkaline Phosphatase: 76 IU/L (ref 39–117)
BUN/Creatinine Ratio: 23 (ref 9–23)
BUN: 15 mg/dL (ref 6–24)
Bilirubin Total: 0.3 mg/dL (ref 0.0–1.2)
CO2: 25 mmol/L (ref 18–29)
Calcium: 8.8 mg/dL (ref 8.7–10.2)
Chloride: 104 mmol/L (ref 96–106)
Creatinine, Ser: 0.65 mg/dL (ref 0.57–1.00)
GFR calc Af Amer: 113 mL/min/{1.73_m2} (ref >59)
GFR calc non Af Amer: 98 mL/min/{1.73_m2} (ref >59)
Globulin, Total: 2.4 g/dL (ref 1.5–4.5)
Glucose: 81 mg/dL (ref 65–99)
Potassium: 4.2 mmol/L (ref 3.5–5.2)
Sodium: 143 mmol/L (ref 134–144)
Total Protein: 6.1 g/dL (ref 6.0–8.5)

## 2015-09-30 LAB — TSH: TSH: 1.82 u[IU]/mL (ref 0.450–4.500)

## 2015-09-30 NOTE — Progress Notes (Signed)
Normal labs.  Patient notified by letter.

## 2015-10-14 ENCOUNTER — Other Ambulatory Visit: Payer: Self-pay | Admitting: Unknown Physician Specialty

## 2015-11-08 ENCOUNTER — Other Ambulatory Visit: Payer: Self-pay | Admitting: Unknown Physician Specialty

## 2015-11-24 ENCOUNTER — Encounter: Payer: Self-pay | Admitting: Family Medicine

## 2015-11-24 ENCOUNTER — Ambulatory Visit (INDEPENDENT_AMBULATORY_CARE_PROVIDER_SITE_OTHER): Payer: BLUE CROSS/BLUE SHIELD | Admitting: Family Medicine

## 2015-11-24 VITALS — BP 150/92 | HR 84 | Temp 98.5°F | Wt 148.0 lb

## 2015-11-24 DIAGNOSIS — H1033 Unspecified acute conjunctivitis, bilateral: Secondary | ICD-10-CM | POA: Diagnosis not present

## 2015-11-24 MED ORDER — CIPROFLOXACIN HCL 0.3 % OP OINT: TOPICAL_OINTMENT | Freq: Two times a day (BID) | OPHTHALMIC | 0 refills | Status: DC

## 2015-11-24 NOTE — Progress Notes (Signed)
BP (!) 150/92   Pulse 84   Temp 98.5 F (36.9 C)   Wt 148 lb (67.1 kg)   LMP  (LMP Unknown)   BMI 23.67 kg/m    Subjective:    Patient ID: Diane Pearson, female    DOB: [birth year], 60 y.o.   MRN: [id number]  HPI: Diane Pearson is a 60 y.o. female  Chief Complaint  Patient presents with  . Conjunctivitis    started Friday evening. Bilateral eye discomfort, some redness, matted up in the mornings.   Friday afternoon had foreign body sensation in right eye and it was pink, then eye was matted shut with thick yellow/green drainage. Yesterday, left eye started with the same symptoms. Intermittent itching in both eyes. Has been trying basic lubricating drops with minimal relief.  No fever, chills, congestion, or sore throat. Some scratchy intermittent cough.  Has had a bad fibromyalgia flare the entire past week while all of this started and states sometimes she does have eye issues with that.  Patient takes hydroxyzine daily for allergies. Usually does have issues during this time of year.   Relevant past medical, surgical, family and social history reviewed and updated as indicated. Interim medical history since our last visit reviewed. Allergies and medications reviewed and updated.  Review of Systems  Constitutional: Negative.   HENT: Negative.   Eyes: Positive for discharge, redness and itching. Negative for photophobia.  Respiratory: Positive for cough.   Cardiovascular: Negative.   Gastrointestinal: Negative.   Genitourinary: Negative.   Musculoskeletal: Negative.   Skin: Negative.   Neurological: Negative.   Psychiatric/Behavioral: Negative.     Per HPI unless specifically indicated above     Objective:    BP (!) 150/92   Pulse 84   Temp 98.5 F (36.9 C)   Wt 148 lb (67.1 kg)   LMP  (LMP Unknown)   BMI 23.67 kg/m   Wt Readings from Last 3 Encounters:  11/24/15 148 lb (67.1 kg)  09/29/15 148 lb (67.1 kg)  06/27/15 140 lb 3.2 oz (63.6 kg)    Physical  Exam  Constitutional: She is oriented to person, place, and time. She appears well-developed and well-nourished. No distress.  HENT:  Head: Atraumatic.  Nose: Nose normal.  Mouth/Throat: Oropharynx is clear and moist. No oropharyngeal exudate.  Eyes: EOM are normal. Pupils are equal, round, and reactive to light. Right eye exhibits no discharge. Left eye exhibits no discharge. No scleral icterus.  B/l conjunctiva mildly pink in color. No discharge or crusting currently  Neck: Normal range of motion. Neck supple.  Cardiovascular: Normal rate and normal heart sounds.   Pulmonary/Chest: Effort normal and breath sounds normal. No respiratory distress.  Musculoskeletal: Normal range of motion.  Lymphadenopathy:    She has no cervical adenopathy.  Neurological: She is alert and oriented to person, place, and time.  Skin: Skin is warm and dry.  Psychiatric: She has a normal mood and affect. Her behavior is normal.  Nursing note and vitals reviewed.   Visual acuity: 20/20 right eye, 20/70 left eye      Assessment & Plan:   Problem List Items Addressed This Visit    None    Visit Diagnoses    Acute bacterial conjunctivitis of both eyes    -  Primary   Visual acuity at baseline. Cipro ointment given, follow up if no improvement in the next few days.        Follow up plan: Return if symptoms  worsen or fail to improve.

## 2015-11-24 NOTE — Patient Instructions (Signed)
Follow up as needed

## 2015-11-28 NOTE — Telephone Encounter (Signed)
Your patient 

## 2015-12-08 ENCOUNTER — Other Ambulatory Visit: Payer: Self-pay | Admitting: Unknown Physician Specialty

## 2016-01-09 ENCOUNTER — Other Ambulatory Visit: Payer: Self-pay | Admitting: Unknown Physician Specialty

## 2016-01-21 ENCOUNTER — Other Ambulatory Visit: Payer: Self-pay | Admitting: Unknown Physician Specialty

## 2016-02-06 ENCOUNTER — Other Ambulatory Visit: Payer: Self-pay | Admitting: Unknown Physician Specialty

## 2016-02-06 MED ORDER — PREGABALIN 75 MG PO CAPS: 75.0000 mg | ORAL_CAPSULE | Freq: Three times a day (TID) | ORAL | 3 refills | Status: DC

## 2016-02-06 NOTE — Telephone Encounter (Signed)
Routing to provider  

## 2016-02-06 NOTE — Telephone Encounter (Signed)
Patient needs a refill on her Lyrica 75mg   before 12/31 after that date she will no longer have insurance coverage.  She states she is in [place] at this time.  She would like these refills sent to   [facility] in [place]

## 2016-03-04 ENCOUNTER — Other Ambulatory Visit: Payer: Self-pay

## 2016-03-05 MED ORDER — HYDROXYZINE HCL 25 MG PO TABS
25.0000 mg | ORAL_TABLET | Freq: Every day | ORAL | 12 refills | Status: DC
Start: 2016-03-05 — End: 2016-04-04

## 2016-03-16 ENCOUNTER — Other Ambulatory Visit: Payer: Self-pay | Admitting: Unknown Physician Specialty

## 2016-03-17 ENCOUNTER — Telehealth: Payer: Self-pay

## 2016-03-17 NOTE — Telephone Encounter (Signed)
Called and left patient a VM asking for her to please return my call. Received a PA request for her lyrica but I want to make sure her insurance did not change before I try to submit.

## 2016-03-17 NOTE — Telephone Encounter (Signed)
Patient returned your call.  [phone]  Thanks

## 2016-03-17 NOTE — Telephone Encounter (Signed)
Tried calling patient back, but she did not answer. Left her a VM letting her know what was going on. I asked for the patient to please call me back if her insurance had changed.

## 2016-03-18 NOTE — Telephone Encounter (Signed)
Pt currently does not have insurance.

## 2016-03-19 ENCOUNTER — Telehealth: Payer: Self-pay | Admitting: Unknown Physician Specialty

## 2016-03-19 NOTE — Telephone Encounter (Signed)
Tried to reach patient to give her information regarding her question about prior auth for her medication.  [employer] states the patient does not have coverage so prior Diane Pearson is not needed  Diane Pearson

## 2016-03-31 ENCOUNTER — Ambulatory Visit
Admission: RE | Admit: 2016-03-31 | Discharge: 2016-03-31 | Disposition: A | Discharge status: Home / Self Care | Payer: Self-pay | Source: Ambulatory Visit | Attending: Oncology | Admitting: Oncology

## 2016-03-31 ENCOUNTER — Ambulatory Visit: Payer: Self-pay | Attending: Oncology

## 2016-03-31 ENCOUNTER — Encounter (INDEPENDENT_AMBULATORY_CARE_PROVIDER_SITE_OTHER): Payer: Self-pay

## 2016-03-31 VITALS — BP 109/76 | HR 81 | Temp 96.7°F | Ht 66.54 in | Wt 148.9 lb

## 2016-03-31 DIAGNOSIS — Z0000 Encounter for general adult medical examination without abnormal findings: Secondary | ICD-10-CM

## 2016-03-31 NOTE — Progress Notes (Signed)
Subjective:     Patient ID: Diane Pearson, female   DOB: 10/11/1955, 61 y.o.   MRN: [id number]  HPI   Review of Systems     Objective:   Physical Exam  Pulmonary/Chest: Right breast exhibits no inverted nipple, no mass, no nipple discharge, no skin change and no tenderness. Left breast exhibits no inverted nipple, no mass, no nipple discharge, no skin change and no tenderness. Breasts are symmetrical.  Genitourinary: No labial fusion. There is no rash, tenderness, lesion or injury on the right labia. There is no rash, tenderness, lesion or injury on the left labia. Uterus is not deviated, not enlarged and not tender. Cervix exhibits no motion tenderness, no discharge and no friability. Right adnexum displays no mass, no tenderness and no fullness. Left adnexum displays no mass, no tenderness and no fullness. No erythema, tenderness or bleeding in the vagina. No foreign body in the vagina. No signs of injury around the vagina. No vaginal discharge found.       Assessment:     61 year old patient presents for [facility] clinic visit.  Patient screened, and meets BCCCP eligibility.  Patient does not have insurance, Medicare or Medicaid.  Handout given on Affordable Care Act. Instructed patient on breast self-exam using teach back method.  CBE unremarkable.   No mass or lump palpated.  Patient had a negative/negative pap 03/03/15 at [facility].  Pelvic exam normal.    Plan:     Sent for bilateral screening mammogram.

## 2016-04-02 ENCOUNTER — Other Ambulatory Visit: Payer: Self-pay | Admitting: *Deleted

## 2016-04-02 ENCOUNTER — Ambulatory Visit: Payer: BLUE CROSS/BLUE SHIELD | Admitting: Unknown Physician Specialty

## 2016-04-02 DIAGNOSIS — R920 Mammographic microcalcification found on diagnostic imaging of breast: Secondary | ICD-10-CM

## 2016-04-04 ENCOUNTER — Other Ambulatory Visit: Payer: Self-pay | Admitting: Unknown Physician Specialty

## 2016-04-05 ENCOUNTER — Telehealth: Payer: Self-pay | Admitting: Unknown Physician Specialty

## 2016-04-05 MED ORDER — METRONIDAZOLE 0.75 % EX CREA: TOPICAL_CREAM | Freq: Two times a day (BID) | CUTANEOUS | 0 refills | Status: DC

## 2016-04-05 NOTE — Telephone Encounter (Signed)
Routing to provider  

## 2016-04-05 NOTE — Telephone Encounter (Signed)
Patient called to see if [name] would call her in metronidadole vaginal gel est 0.75mg /70g to [facility] on [address]  [place] [phone]

## 2016-04-08 ENCOUNTER — Other Ambulatory Visit: Payer: Self-pay | Admitting: *Deleted

## 2016-04-08 ENCOUNTER — Ambulatory Visit
Admission: RE | Admit: 2016-04-08 | Discharge: 2016-04-08 | Disposition: A | Discharge status: Home / Self Care | Payer: Self-pay | Source: Ambulatory Visit | Attending: Oncology | Admitting: Oncology

## 2016-04-08 DIAGNOSIS — R920 Mammographic microcalcification found on diagnostic imaging of breast: Secondary | ICD-10-CM

## 2016-04-09 ENCOUNTER — Other Ambulatory Visit: Payer: Self-pay

## 2016-04-09 DIAGNOSIS — R920 Mammographic microcalcification found on diagnostic imaging of breast: Secondary | ICD-10-CM

## 2016-04-16 ENCOUNTER — Ambulatory Visit (INDEPENDENT_AMBULATORY_CARE_PROVIDER_SITE_OTHER): Payer: Self-pay | Admitting: Unknown Physician Specialty

## 2016-04-16 ENCOUNTER — Encounter: Payer: Self-pay | Admitting: Unknown Physician Specialty

## 2016-04-16 DIAGNOSIS — F5101 Primary insomnia: Secondary | ICD-10-CM

## 2016-04-16 DIAGNOSIS — F332 Major depressive disorder, recurrent severe without psychotic features: Secondary | ICD-10-CM

## 2016-04-16 DIAGNOSIS — M797 Fibromyalgia: Secondary | ICD-10-CM

## 2016-04-16 MED ORDER — CARBAMAZEPINE ER 100 MG PO TB12: 100.0000 mg | ORAL_TABLET | Freq: Two times a day (BID) | ORAL | 1 refills | Status: DC

## 2016-04-16 NOTE — Progress Notes (Signed)
BP 125/82 (BP Location: Left Arm, Cuff Size: Normal)   Pulse 80   Temp 98.6 F (37 C)   Ht 5\' 6"  (1.676 m)   Wt 152 lb 11.2 oz (69.3 kg)   LMP  (LMP Unknown)   SpO2 95%   BMI 24.65 kg/m    Subjective:    Patient ID: Diane Pearson, female    DOB: Sep 04, 1955, 61 y.o.   MRN: [id number]  HPI: Diane Pearson is a 61 y.o. female  Chief Complaint  Patient presents with  . Fibromyalgia    pt states she would like to talk about switching lyrica to a different medication, states it is too expensive without insurance   . Depression   Fibromyalgia Pt would like to switch from Lyrica due to the cost.  She is worse due to the winter.  Stopped taking Prempro and getting hot flashes.  States she is having chest pressure with bending over.  States it feels like a pulled muscle and started after pulling her dogs apart.    Depression Poor control.  She feels this is due to pain and the time of the year.  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  She does take Vitamin D3.   Depression screen [facility] 2/9 04/16/2016 09/29/2015 09/25/2014  Decreased Interest 3 0 0  Down, Depressed, Hopeless 3 0 0  PHQ - 2 Score 6 0 0  Altered sleeping 2 - -  Tired, decreased energy 3 - -  Change in appetite 3 - -  Feeling bad or failure about yourself  0 - -  Trouble concentrating 2 - -  Moving slowly or fidgety/restless 3 - -  Suicidal thoughts 0 - -  PHQ-9 Score 19 - -    Relevant past medical, surgical, family and social history reviewed and updated as indicated. Interim medical history since our last visit reviewed. Allergies and medications reviewed and updated.  Review of Systems  Per HPI unless specifically indicated above     Objective:    BP 125/82 (BP Location: Left Arm, Cuff Size: Normal)   Pulse 80   Temp 98.6 F (37 C)   Ht 5\' 6"  (1.676 m)   Wt 152 lb 11.2 oz (69.3 kg)   LMP  (LMP Unknown)   SpO2 95%   BMI 24.65 kg/m   Wt Readings from Last 3 Encounters:  04/16/16 152 lb 11.2 oz (69.3 kg)    03/31/16 148 lb 14.7 oz (67.5 kg)  11/24/15 148 lb (67.1 kg)    Physical Exam  Constitutional: She is oriented to person, place, and time. She appears well-developed and well-nourished. No distress.  HENT:  Head: Normocephalic and atraumatic.  Eyes: Conjunctivae and lids are normal. Right eye exhibits no discharge. Left eye exhibits no discharge. No scleral icterus.  Neck: Normal range of motion. Neck supple. No JVD present. Carotid bruit is not present.  Cardiovascular: Normal rate, regular rhythm and normal heart sounds.   Pulmonary/Chest: Effort normal and breath sounds normal.  Abdominal: Normal appearance. There is no splenomegaly or hepatomegaly.  Musculoskeletal: Normal range of motion.  Neurological: She is alert and oriented to person, place, and time.  Skin: Skin is warm, dry and intact. No rash noted. No pallor.  Psychiatric: She has a normal mood and affect. Her behavior is normal. Judgment and thought content normal.     Assessment & Plan:   Problem List Items Addressed This Visit      Unprioritized   Fibromyalgia  In general worse.  Wants to stop Lyrica.  Intolerant to Gabapentin.  Start Tegretol BID.  Discussed it is off label.        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            Follow up plan: Return in about 6 weeks (around 05/28/2016).

## 2016-04-16 NOTE — Assessment & Plan Note (Signed)
Not under good control  Recheck in 6 weeks after adjusting medications.

## 2016-04-16 NOTE — Assessment & Plan Note (Addendum)
In general worse.  Wants to stop Lyrica.  Intolerant to Gabapentin.  Start Tegretol BID.  Discussed it is off label.

## 2016-04-16 NOTE — Assessment & Plan Note (Signed)
Currently takes Ambien nightly

## 2016-04-20 ENCOUNTER — Ambulatory Visit
Admission: RE | Admit: 2016-04-20 | Discharge: 2016-04-20 | Disposition: A | Discharge status: Home / Self Care | Payer: Self-pay | Source: Ambulatory Visit | Attending: Oncology | Admitting: Oncology

## 2016-04-20 DIAGNOSIS — R920 Mammographic microcalcification found on diagnostic imaging of breast: Secondary | ICD-10-CM

## 2016-04-20 HISTORY — PX: BREAST BIOPSY: SHX20

## 2016-04-22 LAB — SURGICAL PATHOLOGY

## 2016-04-26 ENCOUNTER — Telehealth: Payer: Self-pay | Admitting: Unknown Physician Specialty

## 2016-04-26 NOTE — Telephone Encounter (Signed)
Patient brought in forms for the [employer] Patient Assistance Program for Lyrica.  Please see instructions and forms in Brittany/Cheryl's box.

## 2016-04-26 NOTE — Telephone Encounter (Signed)
Got forms out of box and started filling out.

## 2016-04-27 NOTE — Progress Notes (Signed)
Radiologist phoned patient with benign biopsy results. To return to annual screening. Phoned patient to remind her to call to schedule appointment. Copy to [facility].

## 2016-04-28 MED ORDER — PREGABALIN 75 MG PO CAPS: 75.0000 mg | ORAL_CAPSULE | Freq: Three times a day (TID) | ORAL | 1 refills | Status: DC

## 2016-04-28 NOTE — Telephone Encounter (Signed)
Forms require a paper prescription be sent in with paperwork. Can we print the prescription for lyrica so I can fax paperwork once you sign?

## 2016-04-28 NOTE — Addendum Note (Signed)
Addended by: Kathrine Haddock on: 04/28/2016 11:27 AM   Modules accepted: Orders

## 2016-05-04 ENCOUNTER — Other Ambulatory Visit: Payer: Self-pay | Admitting: Unknown Physician Specialty

## 2016-05-04 MED ORDER — CARBAMAZEPINE ER 100 MG PO TB12: 100.0000 mg | ORAL_TABLET | Freq: Two times a day (BID) | ORAL | 1 refills | Status: DC

## 2016-05-04 MED ORDER — METOPROLOL SUCCINATE ER 50 MG PO TB24: 50.0000 mg | ORAL_TABLET | Freq: Every day | ORAL | 3 refills | Status: DC

## 2016-05-04 MED ORDER — DULOXETINE HCL 60 MG PO CPEP: 60.0000 mg | ORAL_CAPSULE | Freq: Every day | ORAL | 3 refills | Status: DC

## 2016-05-04 MED ORDER — CYCLOBENZAPRINE HCL 10 MG PO TABS: 10.0000 mg | ORAL_TABLET | Freq: Three times a day (TID) | ORAL | 1 refills | Status: DC | PRN

## 2016-05-04 MED ORDER — ZOLPIDEM TARTRATE 10 MG PO TABS: 10.0000 mg | ORAL_TABLET | Freq: Every day | ORAL | 0 refills | Status: DC

## 2016-05-04 MED ORDER — HYDROXYZINE HCL 25 MG PO TABS: 25.0000 mg | ORAL_TABLET | Freq: Every day | ORAL | 0 refills | Status: DC

## 2016-05-04 NOTE — Telephone Encounter (Signed)
Called and spoke to patient. She states that she no longer has insurance and [facility] is cheaper for her to get her medications from. She would like the ones marked to be sent to [facility].

## 2016-05-04 NOTE — Telephone Encounter (Signed)
Called and spoke to patient. She just wanted to make sure we had the correct prescriptions being sent to [facility]. She had counted them and thought that we missed one but she had actually counted one of her old bottles.

## 2016-05-04 NOTE — Telephone Encounter (Signed)
Patient needs refills on her meds sent to the [facility].   She said she thought they were sending them over last week but per [facility] they had not received them.  Thanks

## 2016-05-25 ENCOUNTER — Telehealth: Payer: Self-pay | Admitting: Unknown Physician Specialty

## 2016-05-25 ENCOUNTER — Encounter: Payer: Self-pay | Admitting: Unknown Physician Specialty

## 2016-05-25 NOTE — Telephone Encounter (Signed)
Patient needs a letter from [place] stating that the patient has been out of work for a year in order for her to be seen at the clinic.  She would like for [place] to send it to her mychart.  Thanks

## 2016-05-25 NOTE — Telephone Encounter (Signed)
Routing to provider  

## 2016-06-01 ENCOUNTER — Ambulatory Visit: Payer: Self-pay | Admitting: Unknown Physician Specialty

## 2016-06-04 ENCOUNTER — Other Ambulatory Visit: Payer: Self-pay | Admitting: Unknown Physician Specialty

## 2016-06-11 ENCOUNTER — Telehealth: Payer: Self-pay | Admitting: Unknown Physician Specialty

## 2016-06-11 NOTE — Telephone Encounter (Signed)
Patient called to get a prescription called into the actual manufacturer.  Prescription is called Prempro (0.625) , the higher dose.Patient was getting medication for free from manufacturer but has ran out and wanted to know if she could get a refill sent to the manufacturer.   Please Advise.   Manufacturer Number: [phone]  Thank you.

## 2016-06-11 NOTE — Telephone Encounter (Signed)
Called and spoke with a representative at [employer]. I explained that the patient is wanting to get her prempro prescription through them now. The representative stated that she was going to fax [place] a form for [place] to fill out and fax back to them regarding the prescription. Will wait for fax to come through.

## 2016-06-11 NOTE — Telephone Encounter (Signed)
Tried Immunologist at the number provided by the patient and the call was disconnected. Will try to call again shortly.

## 2016-06-15 NOTE — Telephone Encounter (Signed)
Called and let patient know that I just received the form from [place] for [place] to fill out and that it would be faxed back first thing tomorrow morning. Will fill out form, have provider approve and sign, and then fax back to [employer].

## 2016-06-15 NOTE — Telephone Encounter (Signed)
Form filled out completely, signed by [name] and faxed back to [employer].

## 2016-06-15 NOTE — Telephone Encounter (Signed)
Called and spoke with Elmyra Ricks at [employer]. I explained to Elmyra Ricks what the patient was wanting and she stated that she was faxing a form to [place] to fill out. Elmyra Ricks stated that it could take the fax up to 20 minutes to get here, to make sure we fill out the form completely with no blanks, and that it could take up to 10 days to process the order. Will await for fax and call patient and let her know what is going on.

## 2016-06-15 NOTE — Telephone Encounter (Signed)
Tried [employer] twice and was on hold for over 5 minutes on both tries. Will try to call again a little later.

## 2016-06-15 NOTE — Telephone Encounter (Signed)
Still have not received a fax from [employer]. Will try to call again.

## 2016-07-07 ENCOUNTER — Telehealth: Payer: Self-pay | Admitting: Unknown Physician Specialty

## 2016-07-07 NOTE — Telephone Encounter (Signed)
Called patient to let her know her Prempro is at our office per [place].   Patient states she will try to pick up at the office tomorrow.

## 2016-07-08 ENCOUNTER — Other Ambulatory Visit: Payer: Self-pay | Admitting: Unknown Physician Specialty

## 2016-07-12 ENCOUNTER — Ambulatory Visit: Payer: Self-pay

## 2016-08-16 ENCOUNTER — Other Ambulatory Visit: Payer: Self-pay | Admitting: Unknown Physician Specialty

## 2016-08-17 ENCOUNTER — Telehealth: Payer: Self-pay | Admitting: Unknown Physician Specialty

## 2016-08-17 NOTE — Telephone Encounter (Signed)
Tried [employer] again, still got the automated message that all reps were busy and wait time was estimated at 20 minutes. Had the option to leave my name and a call back number so I did that. The automated voice stated that I would get a call back once a representative was available.

## 2016-08-17 NOTE — Telephone Encounter (Signed)
Patient returned my call. She stated that [facility] is telling her that they do not have any refills on file for her lyrica. She states that they are needing a new enrollment form from [place]. Will call [place] and see what they are needing from [place] and what we need to do to get the patient her medication.

## 2016-08-17 NOTE — Telephone Encounter (Signed)
Called and left patient a VM asking for her to please return my call when she could.

## 2016-08-17 NOTE — Telephone Encounter (Signed)
Tried Immunologist, automated system said wait time was 20 minutes. Will try to call back shortly.

## 2016-08-18 NOTE — Telephone Encounter (Signed)
Called and spoke to patient. I explained to her everything that has been going on with trying to talk with [facility]. I asked the patient if she had a different number to call them at and she had the same one that I do, [phone]. I also asked if she has ever had any trouble getting in touch with them on the phone and she stated that she has. I will try to call again this afternoon and see if I can get someone on the phone.

## 2016-08-18 NOTE — Telephone Encounter (Signed)
Tried Immunologist again. Waited on the phone for 10  Minutes and the automated system was still saying a 15 minute hold time. Will call patient and see if she maybe has another number I can call.

## 2016-08-19 ENCOUNTER — Other Ambulatory Visit: Payer: Self-pay

## 2016-08-19 NOTE — Telephone Encounter (Signed)
Called and left patient a detailed VM (signed DPR) letting her know that I spoke with someone. I asked for them to please send out her refill that is on the prescription that we last sent in. I gave the patient the order number I was given [id number], and asked for her to use this number if she had any questions regarding the prescription. I also told the patient that I was told that her medication should arrive in 7 to 10 business days. I asked for the patient to give [place] a call back if she had any questions or concerns about anything.

## 2016-08-19 NOTE — Telephone Encounter (Signed)
Patient called and would like a refill on her ambien.

## 2016-08-19 NOTE — Telephone Encounter (Signed)
Called and spoke with [place] at [employer]. I explained that the patient states that she is having an issue getting her Lyrica. Kristeen Miss stated that she did not see anything holding it back and that the medication was last sent to the patient in April. I told her that the patient is getting low on her medication and needed a refill sent to her. I explained that the prescription we last sent in should have a refill on it according to what we sent in. Kristeen Miss stated that she saw that and put in a order for the refill to be sent to the patient. She gave me the order number [id number], and asked for [place] to give this number if we needed to call back regarding the order. Will call patient and let her know.

## 2016-08-20 MED ORDER — ZOLPIDEM TARTRATE 10 MG PO TABS: 10.0000 mg | ORAL_TABLET | Freq: Every day | ORAL | 0 refills | Status: DC

## 2016-09-13 ENCOUNTER — Telehealth: Payer: Self-pay | Admitting: Unknown Physician Specialty

## 2016-09-13 MED ORDER — CONJ ESTROG-MEDROXYPROGEST ACE 0.625-2.5 MG PO TABS: 1.0000 | ORAL_TABLET | Freq: Every day | ORAL | 3 refills | Status: DC

## 2016-09-13 NOTE — Telephone Encounter (Signed)
Patient requesting prescription for Prempro. Prescription needing to be picked up by patient once called in through [facility]  Number for Ordering prescription: [phone]  Patient number: [id number]  Please Advise.  Thank you

## 2016-09-13 NOTE — Telephone Encounter (Signed)
Routing to provider. Please PRINT prescription for patient pick up and route back to me so I can call the prescription in.

## 2016-09-13 NOTE — Telephone Encounter (Signed)
Tried calling in patient's medication as requested. The automated system stated that my wait time was approximately 51 minutes. Will try to call again first thing in the morning.

## 2016-09-14 NOTE — Telephone Encounter (Signed)
Called patient's prescription into [facility]. Will call and let her know that her hard copy is ready for her to pick up.

## 2016-09-14 NOTE — Telephone Encounter (Signed)
Called and left patient a VM letting her know that her prescription was called in to [facility] as requested. I also let her know that the hard copy was ready to be picked up at the front desk as well.

## 2016-09-17 ENCOUNTER — Other Ambulatory Visit: Payer: Self-pay | Admitting: Unknown Physician Specialty

## 2016-09-20 ENCOUNTER — Telehealth: Payer: Self-pay | Admitting: Unknown Physician Specialty

## 2016-09-20 NOTE — Telephone Encounter (Signed)
Medication added to med list per patient request.

## 2016-09-20 NOTE — Telephone Encounter (Signed)
Patient would like for you to add to her medication chart Levothyroxine 50mg  as she wants to keep the list we have updated.  If any questions call X[facility] [phone]  Thank you

## 2016-09-23 ENCOUNTER — Other Ambulatory Visit: Payer: Self-pay | Admitting: Unknown Physician Specialty

## 2016-09-25 ENCOUNTER — Other Ambulatory Visit: Payer: Self-pay | Admitting: Unknown Physician Specialty

## 2016-09-27 ENCOUNTER — Telehealth: Payer: Self-pay | Admitting: Unknown Physician Specialty

## 2016-09-27 NOTE — Telephone Encounter (Signed)
Patient states she needs her Ambien script sent in and she also is calling to see if the prempro has arrived here from the company  Thank you

## 2016-09-27 NOTE — Telephone Encounter (Signed)
Called and spoke with patient. She states that she would like her Ambien sent to [facility]. I explained to the patient that we have not gotten any Prempro in for her. She states that she is going to call the company and find out what is going on with her medication.

## 2016-09-28 ENCOUNTER — Telehealth: Payer: Self-pay | Admitting: Unknown Physician Specialty

## 2016-09-28 NOTE — Telephone Encounter (Signed)
Looked up front and patient's medication was there. I called and spoke with her. I apologized because I was unaware that the medication was up there. Patient states that she remembered getting a call saying that it was here but when she came by, she was told it was not here. She also stated that she was back and worth picking up paperwork and understand how things got mixed up.

## 2016-10-16 ENCOUNTER — Other Ambulatory Visit: Payer: Self-pay | Admitting: Unknown Physician Specialty

## 2016-10-30 ENCOUNTER — Other Ambulatory Visit: Payer: Self-pay | Admitting: Unknown Physician Specialty

## 2016-10-31 NOTE — Telephone Encounter (Signed)
rx

## 2016-11-09 ENCOUNTER — Other Ambulatory Visit: Payer: Self-pay | Admitting: Family Medicine

## 2016-12-06 ENCOUNTER — Other Ambulatory Visit: Payer: Self-pay | Admitting: Unknown Physician Specialty

## 2016-12-08 ENCOUNTER — Telehealth: Payer: Self-pay | Admitting: Unknown Physician Specialty

## 2016-12-08 NOTE — Telephone Encounter (Signed)
Copied from [place] #[id number]. Topic: Inquiry >> Dec 08, 2016  4:46 PM Neva Seat wrote: Pt"s lawyer needs paperwork filled out by Dr. Julian Hy for fibromyalgia hearing that will be on Tues.of next week.   Pt wants a call her back to let her know you received this message.   Pt wants to come by after 4pm when she receives the paperwork on Thurs. at the earliest to bring It by the office to be filled out by Dr. Julian Hy.

## 2016-12-09 NOTE — Telephone Encounter (Signed)
Spoke with pt and advised her that [place] wasn't in today but she could bring the forms by for her to look at in the morning and we would give her a call if and when it was completed.

## 2016-12-10 ENCOUNTER — Telehealth: Payer: Self-pay | Admitting: Unknown Physician Specialty

## 2016-12-10 NOTE — Telephone Encounter (Signed)
Diane Pearson with [facility] [employer] called to report they will be mailing pt. Her Prempro to the provider's office .

## 2016-12-10 NOTE — Telephone Encounter (Signed)
Called and let patient know that paperwork was ready to be picked up.

## 2016-12-10 NOTE — Telephone Encounter (Signed)
Patient dropped off paperwork to be filled out. Placed in basket. Patient would like to be called when completed.  Thanks  [phone]

## 2017-01-01 ENCOUNTER — Other Ambulatory Visit: Payer: Self-pay | Admitting: Unknown Physician Specialty

## 2017-01-08 ENCOUNTER — Other Ambulatory Visit: Payer: Self-pay | Admitting: Unknown Physician Specialty

## 2017-01-08 ENCOUNTER — Other Ambulatory Visit: Payer: Self-pay | Admitting: Family Medicine

## 2017-01-10 ENCOUNTER — Other Ambulatory Visit: Payer: Self-pay | Admitting: Unknown Physician Specialty

## 2017-01-10 MED ORDER — PREGABALIN 75 MG PO CAPS: 75.0000 mg | ORAL_CAPSULE | Freq: Three times a day (TID) | ORAL | 1 refills | Status: DC

## 2017-01-10 NOTE — Telephone Encounter (Signed)
Copied from [place]. Topic: General - Other >> Jan 10, 2017  1:10 PM Cecelia Byars, NT wrote: Reason for CRM:  [facility] pathways Patient Assistant  called and patient is getting 75 mg lyrica 3x a day and needs prescription faxed to [phone]  please call with any questions [phone]  patient id [id number]

## 2017-01-10 NOTE — Telephone Encounter (Signed)
rx

## 2017-01-10 NOTE — Telephone Encounter (Signed)
Routing to provider  

## 2017-01-10 NOTE — Telephone Encounter (Signed)
RX faxed to [facility].

## 2017-01-10 NOTE — Telephone Encounter (Signed)
Controlled Substance:

## 2017-02-04 ENCOUNTER — Telehealth: Payer: Self-pay | Admitting: Unknown Physician Specialty

## 2017-02-04 MED ORDER — SULFAMETHOXAZOLE-TRIMETHOPRIM 800-160 MG PO TABS: 1.0000 | ORAL_TABLET | Freq: Two times a day (BID) | ORAL | 0 refills | Status: DC

## 2017-02-04 NOTE — Telephone Encounter (Signed)
Copied from [place]. Topic: Quick Communication - See Telephone Encounter >> Feb 04, 2017 10:32 AM Corie Chiquito, NT wrote: CRM for notification. See Telephone encounter for: Patient call because she is out of town and got her nose pierced and now it is infected. She would like to know if she could have some antibiotics called into the [facility] on [address] in [place]. They can be reached at [phone]. If someone could give her a call back about this at [phone]  02/04/17.

## 2017-02-04 NOTE — Telephone Encounter (Signed)
Pt wanting to know if antibiotics could be called in. Pt is in [place], [place] and thinks her pierced nose is infected.

## 2017-02-04 NOTE — Telephone Encounter (Signed)
Patient notified

## 2017-02-04 NOTE — Telephone Encounter (Signed)
Routing to provider. Pharmacy updated in chart.

## 2017-02-18 ENCOUNTER — Telehealth: Payer: Self-pay | Admitting: Unknown Physician Specialty

## 2017-02-18 ENCOUNTER — Other Ambulatory Visit: Payer: Self-pay | Admitting: Unknown Physician Specialty

## 2017-02-18 MED ORDER — PREGABALIN 75 MG PO CAPS: 75.0000 mg | ORAL_CAPSULE | Freq: Three times a day (TID) | ORAL | 1 refills | Status: DC

## 2017-02-18 NOTE — Telephone Encounter (Signed)
Copied from [place] [id number]. Topic: Quick Communication - Rx Refill/Question >> Feb 18, 2017 11:59 AM Percell Belt A wrote: Medication:  pregabalin (LYRICA) 75 MG capsule [[id number]]   Has the patient contacted their pharmacy? no   (Agent: If no, request that the patient contact the pharmacy for the refill.)   Preferred Pharmacy (with phone number or street name): this pt gets this refilled though the patient asst program?  Last rec 12/13/2016.  She normally gets a 3 month supply.     Agent: Please be advised that RX refills may take up to 3 business days. We ask that you follow-up with your pharmacy.

## 2017-02-28 ENCOUNTER — Telehealth: Payer: Self-pay | Admitting: Unknown Physician Specialty

## 2017-02-28 MED ORDER — CONJ ESTROG-MEDROXYPROGEST ACE 0.625-2.5 MG PO TABS: 1.0000 | ORAL_TABLET | Freq: Every day | ORAL | 3 refills | Status: DC

## 2017-02-28 NOTE — Telephone Encounter (Signed)
Routing to provider to fill prescription. Please do no print so I can call it in to [place] for the patient.

## 2017-02-28 NOTE — Telephone Encounter (Signed)
[facility] and spoke with [employer]. While calling in the RX, Renee asked if the quantity was still 84 and I told her no, it was 90. Joseph Art states that this is over the maximum quantity so it has to be an override by a Librarian, academic. She states that we will get a fax to fill out and send back in for the override. Will await the fax.

## 2017-02-28 NOTE — Telephone Encounter (Signed)
Rx written. OK to call in 

## 2017-02-28 NOTE — Telephone Encounter (Signed)
Copied from [place] [id number]. Topic: Quick Communication - Rx Refill/Question >> Feb 28, 2017 11:25 AM Scherrie Gerlach wrote: Medication: estrogen, conjugated,-medroxyprogesterone (PREMPRO) 0.625-2.5 MG tablet   Pt calling to get this refill started.  Needs to be done through assistance program [place]. Pt states the dr has ordered for her, and Rx has to be delivered to your office  Pt states the office can call this in. [employer] [phone]

## 2017-03-07 MED ORDER — CONJ ESTROG-MEDROXYPROGEST ACE 0.625-2.5 MG PO TABS: 1.0000 | ORAL_TABLET | Freq: Every day | ORAL | 3 refills | Status: DC

## 2017-03-07 NOTE — Addendum Note (Signed)
Addended by: Kathrine Haddock on: 03/07/2017 12:19 PM   Modules accepted: Orders

## 2017-03-07 NOTE — Telephone Encounter (Signed)
RX called into [facility] for the patient. Medication will be delivered here to the office.

## 2017-03-07 NOTE — Telephone Encounter (Signed)
Have not received fax from [facility] regarding patient's medication. Diane Pearson can we change the RX to 84 tablets so I can call it in verbally to the pharmacy? 84 is their quantity limit.

## 2017-03-29 ENCOUNTER — Other Ambulatory Visit: Payer: Self-pay | Admitting: Unknown Physician Specialty

## 2017-03-29 MED ORDER — ZOLPIDEM TARTRATE 10 MG PO TABS: 10.0000 mg | ORAL_TABLET | Freq: Every day | ORAL | 5 refills | Status: DC

## 2017-03-29 MED ORDER — HYDROXYZINE HCL 25 MG PO TABS: 25.0000 mg | ORAL_TABLET | Freq: Every day | ORAL | 0 refills | Status: DC

## 2017-03-29 MED ORDER — DULOXETINE HCL 60 MG PO CPEP: 60.0000 mg | ORAL_CAPSULE | Freq: Every day | ORAL | 3 refills | Status: DC

## 2017-03-29 NOTE — Telephone Encounter (Signed)
Hydroxyzine refill request Last OV 04/16/16;   Last OV for physical 09/29/15.  I do not see this medication addressed in any of the notes. [place], [place] [employer].

## 2017-03-29 NOTE — Telephone Encounter (Signed)
Rx refill request for provider review:  Zolpidem 10 mg Last filled 01/10/17  Hydroxyzine 25 mg Last filled 02/21/17  Duloxetine 60 mg Last filled 05/04/16  LOV: 04/16/16 Next appointment: 05/30/17  Pharmacy: verified

## 2017-03-29 NOTE — Telephone Encounter (Signed)
Copied from [place]. Topic: General - Other >> Mar 29, 2017 11:17 AM Neva Seat wrote: Zolpidem 10 mg Hydroxyzine 25 mg Duloxetine 60 mg    [place] [place] [address] Phone: [phone] Fax: [phone]

## 2017-04-27 ENCOUNTER — Ambulatory Visit: Payer: Self-pay

## 2017-05-04 ENCOUNTER — Other Ambulatory Visit: Payer: Self-pay | Admitting: Unknown Physician Specialty

## 2017-05-09 ENCOUNTER — Other Ambulatory Visit: Payer: Self-pay

## 2017-05-09 MED ORDER — PREGABALIN 75 MG PO CAPS: 75.0000 mg | ORAL_CAPSULE | Freq: Three times a day (TID) | ORAL | 1 refills | Status: DC

## 2017-05-11 ENCOUNTER — Ambulatory Visit
Admission: RE | Admit: 2017-05-11 | Discharge: 2017-05-11 | Disposition: A | Discharge status: Home / Self Care | Payer: Medicare Other | Source: Ambulatory Visit | Attending: Oncology | Admitting: Oncology

## 2017-05-11 ENCOUNTER — Ambulatory Visit: Payer: Medicare Other | Attending: Oncology

## 2017-05-11 VITALS — BP 133/88 | HR 103 | Temp 96.5°F | Ht 66.0 in | Wt 162.0 lb

## 2017-05-11 DIAGNOSIS — Z1231 Encounter for screening mammogram for malignant neoplasm of breast: Secondary | ICD-10-CM | POA: Diagnosis not present

## 2017-05-11 DIAGNOSIS — Z0000 Encounter for general adult medical examination without abnormal findings: Secondary | ICD-10-CM

## 2017-05-11 NOTE — Progress Notes (Signed)
Subjective:     Patient ID: Diane Pearson, female   DOB: 10-21-1955, 62 y.o.   MRN: [id number]  HPI   Review of Systems     Objective:   Physical Exam  Pulmonary/Chest: Right breast exhibits no inverted nipple, no mass, no nipple discharge, no skin change and no tenderness. Left breast exhibits no inverted nipple, no mass, no nipple discharge, no skin change and no tenderness. Breasts are symmetrical.       Assessment:     62 year old patient presents for [facility] clinic visit.  Patient screened, and meets BCCCP eligibility.  Patient does not have insurance, Medicare or Medicaid.  Handout given on Affordable Care Act.  Instructed patient on breast self awareness using teach back method.  CBE unremarkable.  No mass or lump palpated.  Patient has just received Medicare, and has first Medicare appointment with Primary care in 2 weeks.  Medicare to pay for mammogram.    Plan:     Sent for bilateral screening mammogram.

## 2017-05-12 DIAGNOSIS — N390 Urinary tract infection, site not specified: Secondary | ICD-10-CM | POA: Diagnosis not present

## 2017-05-20 ENCOUNTER — Other Ambulatory Visit: Payer: Self-pay

## 2017-05-20 MED ORDER — CONJ ESTROG-MEDROXYPROGEST ACE 0.625-2.5 MG PO TABS: 1.0000 | ORAL_TABLET | Freq: Every day | ORAL | 3 refills | Status: DC

## 2017-05-20 NOTE — Telephone Encounter (Signed)
[name] can you please print the prempro? I have to fax it in with patient's paperwork, they need the hardcopy.

## 2017-05-20 NOTE — Progress Notes (Signed)
Letter mailed from [facility] to notify of normal mammogram results.  Patient to return in one year for annual screening.  Copy to [facility]. 

## 2017-05-20 NOTE — Addendum Note (Signed)
Addended by: Kathrine Haddock on: 05/20/2017 01:19 PM   Modules accepted: Orders

## 2017-05-30 ENCOUNTER — Encounter: Payer: Self-pay | Admitting: Unknown Physician Specialty

## 2017-05-30 ENCOUNTER — Ambulatory Visit (INDEPENDENT_AMBULATORY_CARE_PROVIDER_SITE_OTHER): Payer: Medicare Other | Admitting: Unknown Physician Specialty

## 2017-05-30 ENCOUNTER — Telehealth: Payer: Self-pay | Admitting: *Deleted

## 2017-05-30 ENCOUNTER — Telehealth: Payer: Self-pay | Admitting: Unknown Physician Specialty

## 2017-05-30 VITALS — BP 143/85 | HR 77 | Temp 99.3°F | Ht 65.3 in | Wt 149.8 lb

## 2017-05-30 DIAGNOSIS — E039 Hypothyroidism, unspecified: Secondary | ICD-10-CM | POA: Insufficient documentation

## 2017-05-30 DIAGNOSIS — Z0001 Encounter for general adult medical examination with abnormal findings: Secondary | ICD-10-CM | POA: Diagnosis not present

## 2017-05-30 DIAGNOSIS — F5101 Primary insomnia: Secondary | ICD-10-CM

## 2017-05-30 DIAGNOSIS — M797 Fibromyalgia: Secondary | ICD-10-CM

## 2017-05-30 DIAGNOSIS — R5382 Chronic fatigue, unspecified: Secondary | ICD-10-CM

## 2017-05-30 DIAGNOSIS — Z122 Encounter for screening for malignant neoplasm of respiratory organs: Secondary | ICD-10-CM

## 2017-05-30 DIAGNOSIS — Z7189 Other specified counseling: Secondary | ICD-10-CM | POA: Insufficient documentation

## 2017-05-30 DIAGNOSIS — M79672 Pain in left foot: Principal | ICD-10-CM

## 2017-05-30 DIAGNOSIS — G250 Essential tremor: Secondary | ICD-10-CM | POA: Diagnosis not present

## 2017-05-30 DIAGNOSIS — Z87891 Personal history of nicotine dependence: Secondary | ICD-10-CM

## 2017-05-30 DIAGNOSIS — Z780 Asymptomatic menopausal state: Secondary | ICD-10-CM | POA: Diagnosis not present

## 2017-05-30 DIAGNOSIS — Z0000 Encounter for general adult medical examination without abnormal findings: Secondary | ICD-10-CM

## 2017-05-30 DIAGNOSIS — K140 Glossitis: Secondary | ICD-10-CM | POA: Diagnosis not present

## 2017-05-30 DIAGNOSIS — M79671 Pain in right foot: Secondary | ICD-10-CM

## 2017-05-30 NOTE — Telephone Encounter (Signed)
Received referral for initial lung cancer screening scan. Contacted patient and obtained smoking history,(current, 45 pack year) as well as answering questions related to screening process. Patient denies signs of lung cancer such as weight loss or hemoptysis. Patient denies comorbidity that would prevent curative treatment if lung cancer were found. Patient is scheduled for shared decision making visit and CT scan on 06/09/17.

## 2017-05-30 NOTE — Assessment & Plan Note (Signed)
Discussed Ambien use.  Encouraged to wean off of Ambien.  Will prescribe 1/day.

## 2017-05-30 NOTE — Progress Notes (Signed)
BP (!) 143/85   Pulse 77   Temp 99.3 F (37.4 C) (Oral)   Ht 5' 5.3" (1.659 m)   Wt 149 lb 12.8 oz (67.9 kg)   LMP  (LMP Unknown)   SpO2 98%   BMI 24.70 kg/m    Subjective:    Patient ID: Diane Pearson, female    DOB: Dec 19, 1955, 62 y.o.   MRN: [id number]  HPI: Diane Pearson is a 62 y.o. female  Chief Complaint  Patient presents with  . Medicare Wellness    Welcome to Medicare   Functional Status Survey: Is the patient deaf or have difficulty hearing?: No Does the patient have difficulty seeing, even when wearing glasses/contacts?: Yes Does the patient have difficulty concentrating, remembering, or making decisions?: Yes Does the patient have difficulty walking or climbing stairs?: Yes Does the patient have difficulty dressing or bathing?: Yes(sometimes) Does the patient have difficulty doing errands alone such as visiting a doctor's office or shopping?: Yes(sometimes)   Fall Risk  05/30/2017 09/29/2015 09/25/2014  Falls in the past year? Yes No Yes  Number falls in past yr: 2 or more - 2 or more  Injury with Fall? No - No  Follow up Falls evaluation completed - -   Depression screen [facility] 2/9 05/30/2017 04/16/2016 09/29/2015 09/25/2014  Decreased Interest 0 3 0 0  Down, Depressed, Hopeless 0 3 0 0  PHQ - 2 Score 0 6 0 0  Altered sleeping 3 2 - -  Tired, decreased energy 3 3 - -  Change in appetite 0 3 - -  Feeling bad or failure about yourself  0 0 - -  Trouble concentrating 3 2 - -  Moving slowly or fidgety/restless 2 3 - -  Suicidal thoughts 0 0 - -  PHQ-9 Score 11 19 - -  Difficult doing work/chores Very difficult - - -   Fibromyalgia Pt states things haven't gotten worse.  States Lyrica really helps.  She is now on disability  Glossitis Pt states her tongue hurts "all the time."  Hs not had B12 checked for some time  Hypothyroid It has been a while since thyroid checked.    Tremor Improved with Metoprolol   Insomnia Pt states she has trouble sleeping  every night.  Does not nap during the day.  States sometimes she takes 1/2 pill.    Tobacco Smoking 1 ppd  Relevant past medical, surgical, family and social history reviewed and updated as indicated. Interim medical history since our last visit reviewed. Allergies and medications reviewed and updated.  Review of Systems  Constitutional: Negative.   HENT: Negative.   Eyes: Negative.   Respiratory: Negative.   Cardiovascular: Negative.   Gastrointestinal: Negative.   Endocrine: Negative.   Genitourinary:       Went to urgent care for UTI symptoms.  This improved with antibiotics though was told no bacteria  Allergic/Immunologic: Negative.   Neurological: Negative.   Hematological: Negative.   Psychiatric/Behavioral: Negative.     Per HPI unless specifically indicated above     Objective:    BP (!) 143/85   Pulse 77   Temp 99.3 F (37.4 C) (Oral)   Ht 5' 5.3" (1.659 m)   Wt 149 lb 12.8 oz (67.9 kg)   LMP  (LMP Unknown)   SpO2 98%   BMI 24.70 kg/m   Wt Readings from Last 3 Encounters:  05/30/17 149 lb 12.8 oz (67.9 kg)  05/11/17 162 lb (73.5 kg)  04/16/16  152 lb 11.2 oz (69.3 kg)    Physical Exam  Constitutional: She is oriented to person, place, and time. She appears well-developed and well-nourished.  HENT:  Head: Normocephalic and atraumatic.  Eyes: Pupils are equal, round, and reactive to light. Right eye exhibits no discharge. Left eye exhibits no discharge. No scleral icterus.  Neck: Normal range of motion. Neck supple. Carotid bruit is not present. No thyromegaly present.  Cardiovascular: Normal rate, regular rhythm and normal heart sounds. Exam reveals no gallop and no friction rub.  No murmur heard. Pulmonary/Chest: Effort normal and breath sounds normal. No respiratory distress. She has no wheezes. She has no rales. No breast tenderness or discharge.  Abdominal: Soft. Bowel sounds are normal. There is no tenderness. There is no rebound.  Genitourinary: No  breast tenderness or discharge.  Musculoskeletal: Normal range of motion.  Lymphadenopathy:    She has no cervical adenopathy.  Neurological: She is alert and oriented to person, place, and time.  Skin: Skin is warm, dry and intact. No rash noted.  Psychiatric: She has a normal mood and affect. Her speech is normal and behavior is normal. Judgment and thought content normal. Cognition and memory are normal.   EKG: NSR without any STTW changes.  No acute changes    Assessment & Plan:   Problem List Items Addressed This Visit      Unprioritized   Advance care planning    A voluntary discussion about advance care planning including the explanation and discussion of advance directives was extensively discussed  with the patient.  Explanation about the health care proxy and Living will was reviewed and packet with forms with explanation of how to fill them out was given.  During this discussion, the patient was able to identify a health care proxy as her daughter and plans to fill out the paperwork required.  Patient was offered a separate [place] visit for further assistance with forms.  Time spent:  20 mintes      Individuals present: pt       Benign essential tremor    Stable on metoprolol      Relevant Orders   Comprehensive metabolic panel   Fatigue    This seems to be variable but stable      Relevant Orders   TSH   Fibromyalgia    Stable on current medications      Hypothyroid    This was diagnosed at a free clinic.  It's been some time since last checked      Relevant Orders   TSH   Insomnia    Discussed Ambien use.  Encouraged to wean off of Ambien.  Will prescribe 1/day.        Menopause    Taking Prempro. Doesn't feel like she can come off.  Mammogram done this year       Other Visit Diagnoses    Initial Medicare annual wellness visit    -  Primary   Relevant Orders   EKG 12-Lead (Completed)   Lipid Panel w/o Chol/HDL Ratio   Glossitis         Check B12 and CBC   Relevant Orders   CBC with Differential/Platelet   Vitamin B12       Follow up plan: Return in about 6 months (around 11/29/2017).

## 2017-05-30 NOTE — Assessment & Plan Note (Signed)
A voluntary discussion about advance care planning including the explanation and discussion of advance directives was extensively discussed  with the patient.  Explanation about the health care proxy and Living will was reviewed and packet with forms with explanation of how to fill them out was given.  During this discussion, the patient was able to identify a health care proxy as her daughter and plans to fill out the paperwork required.  Patient was offered a separate [place] visit for further assistance with forms.  Time spent:  20 mintes      Individuals present: pt

## 2017-05-30 NOTE — Assessment & Plan Note (Signed)
This seems to be variable but stable

## 2017-05-30 NOTE — Assessment & Plan Note (Signed)
Stable on current medications 

## 2017-05-30 NOTE — Assessment & Plan Note (Signed)
Taking Prempro. Doesn't feel like she can come off.  Mammogram done this year

## 2017-05-30 NOTE — Telephone Encounter (Signed)
Pt complaining of foot pain

## 2017-05-30 NOTE — Assessment & Plan Note (Signed)
This was diagnosed at a free clinic.  It's been some time since last checked

## 2017-05-30 NOTE — Telephone Encounter (Signed)
Routing to provider for referral

## 2017-05-30 NOTE — Telephone Encounter (Signed)
Patient decided that she would like to have a referral to podiatry per provider. Pt. Stated provider discussed option with her during appointment and she declined but changed her mind while getting blood work done.   Please Advise.  Thank you

## 2017-05-30 NOTE — Assessment & Plan Note (Signed)
Stable on metoprolol. °

## 2017-05-31 LAB — COMPREHENSIVE METABOLIC PANEL
ALT: 17 IU/L (ref 0–32)
AST: 20 IU/L (ref 0–40)
Albumin/Globulin Ratio: 1.7 (ref 1.2–2.2)
Albumin: 3.9 g/dL (ref 3.6–4.8)
Alkaline Phosphatase: 84 IU/L (ref 39–117)
BUN/Creatinine Ratio: 21 (ref 12–28)
BUN: 18 mg/dL (ref 8–27)
Bilirubin Total: 0.3 mg/dL (ref 0.0–1.2)
CO2: 23 mmol/L (ref 20–29)
Calcium: 9 mg/dL (ref 8.7–10.3)
Chloride: 104 mmol/L (ref 96–106)
Creatinine, Ser: 0.86 mg/dL (ref 0.57–1.00)
GFR calc Af Amer: 85 mL/min/{1.73_m2} (ref >59)
GFR calc non Af Amer: 74 mL/min/{1.73_m2} (ref >59)
Globulin, Total: 2.3 g/dL (ref 1.5–4.5)
Glucose: 86 mg/dL (ref 65–99)
Potassium: 4.1 mmol/L (ref 3.5–5.2)
Sodium: 141 mmol/L (ref 134–144)
Total Protein: 6.2 g/dL (ref 6.0–8.5)

## 2017-05-31 LAB — CBC WITH DIFFERENTIAL/PLATELET
Basophils Absolute: 0.1 10*3/uL (ref 0.0–0.2)
Basos: 1 %
EOS (ABSOLUTE): 0.7 10*3/uL — ABNORMAL HIGH (ref 0.0–0.4)
Eos: 9 %
Hematocrit: 34.9 % (ref 34.0–46.6)
Hemoglobin: 11.7 g/dL (ref 11.1–15.9)
Immature Grans (Abs): 0 10*3/uL (ref 0.0–0.1)
Immature Granulocytes: 0 %
Lymphocytes Absolute: 2.1 10*3/uL (ref 0.7–3.1)
Lymphs: 29 %
MCH: 31.2 pg (ref 26.6–33.0)
MCHC: 33.5 g/dL (ref 31.5–35.7)
MCV: 93 fL (ref 79–97)
Monocytes Absolute: 0.4 10*3/uL (ref 0.1–0.9)
Monocytes: 5 %
Neutrophils Absolute: 4.1 10*3/uL (ref 1.4–7.0)
Neutrophils: 56 %
Platelets: 240 10*3/uL (ref 150–379)
RBC: 3.75 x10E6/uL — ABNORMAL LOW (ref 3.77–5.28)
RDW: 13.2 % (ref 12.3–15.4)
WBC: 7.4 10*3/uL (ref 3.4–10.8)

## 2017-05-31 LAB — VITAMIN B12: Vitamin B-12: 1886 pg/mL — ABNORMAL HIGH (ref 232–1245)

## 2017-05-31 LAB — LIPID PANEL W/O CHOL/HDL RATIO
Cholesterol, Total: 181 mg/dL (ref 100–199)
HDL: 42 mg/dL (ref >39)
LDL Calculated: 116 mg/dL — ABNORMAL HIGH (ref 0–99)
Triglycerides: 114 mg/dL (ref 0–149)
VLDL Cholesterol Cal: 23 mg/dL (ref 5–40)

## 2017-05-31 LAB — TSH: TSH: 3.7 u[IU]/mL (ref 0.450–4.500)

## 2017-05-31 NOTE — Progress Notes (Signed)
Notified pt by mychart

## 2017-06-03 ENCOUNTER — Other Ambulatory Visit: Payer: Self-pay | Admitting: Unknown Physician Specialty

## 2017-06-03 ENCOUNTER — Telehealth: Payer: Self-pay

## 2017-06-03 MED ORDER — ATORVASTATIN CALCIUM 20 MG PO TABS
20.0000 mg | ORAL_TABLET | Freq: Every day | ORAL | 3 refills | Status: DC
Start: 2017-06-03 — End: 2017-11-29

## 2017-06-03 NOTE — Telephone Encounter (Signed)
Copied from [place]. Topic: General - Other >> Jun 03, 2017  1:48 PM Lennox Solders wrote: Reason for CRM: pt is calling she can not respond through mychart and would like md to know she is ok with starting atorvastatin. [address]   Routing to provider.

## 2017-06-06 NOTE — Telephone Encounter (Signed)
hydroxyzine refill Last OV: 11/24/15 (where issue and med was addressed) Last Refill:03/29/17 #30 1 RF Pharmacy:[facility] [address] PCP: Kathrine Haddock NP

## 2017-06-08 ENCOUNTER — Encounter: Payer: Self-pay | Admitting: Nurse Practitioner

## 2017-06-09 ENCOUNTER — Inpatient Hospital Stay: Payer: Medicare Other | Attending: Nurse Practitioner | Admitting: Nurse Practitioner

## 2017-06-09 ENCOUNTER — Ambulatory Visit
Admission: RE | Admit: 2017-06-09 | Discharge: 2017-06-09 | Disposition: A | Discharge status: Home / Self Care | Payer: Medicare Other | Source: Ambulatory Visit | Attending: Nurse Practitioner | Admitting: Nurse Practitioner

## 2017-06-09 DIAGNOSIS — I2510 Atherosclerotic heart disease of native coronary artery without angina pectoris: Secondary | ICD-10-CM | POA: Insufficient documentation

## 2017-06-09 DIAGNOSIS — F17210 Nicotine dependence, cigarettes, uncomplicated: Secondary | ICD-10-CM

## 2017-06-09 DIAGNOSIS — Z87891 Personal history of nicotine dependence: Secondary | ICD-10-CM | POA: Diagnosis not present

## 2017-06-09 DIAGNOSIS — Z122 Encounter for screening for malignant neoplasm of respiratory organs: Secondary | ICD-10-CM | POA: Diagnosis not present

## 2017-06-09 DIAGNOSIS — J439 Emphysema, unspecified: Secondary | ICD-10-CM | POA: Diagnosis not present

## 2017-06-09 NOTE — Progress Notes (Signed)
In accordance with CMS guidelines, patient has met eligibility criteria including age, absence of signs or symptoms of lung cancer.  Social History   Tobacco Use  . Smoking status: Current Every Day Smoker    Packs/day: 1.00    Years: 45.00    Pack years: 45.00    Types: Cigarettes  . Smokeless tobacco: Never Used  Substance Use Topics  . Alcohol use: No    Alcohol/week: 0.0 oz  . Drug use: No      A shared decision-making session was conducted prior to the performance of CT scan. This includes one or more decision aids, includes benefits and harms of screening, follow-up diagnostic testing, over-diagnosis, false positive rate, and total radiation exposure.   Counseling on the importance of adherence to annual lung cancer LDCT screening, impact of co-morbidities, and ability or willingness to undergo diagnosis and treatment is imperative for compliance of the program.   Counseling on the importance of continued smoking cessation for former smokers; the importance of smoking cessation for current smokers, and information about tobacco cessation interventions have been given to patient including [place] [facility]uit [facility] and 1800 quit [place] programs.   Written order for lung cancer screening with LDCT has been given to the patient and any and all questions have been answered to the best of my abilities.    Yearly follow up will be coordinated by Shawn Perkins, Thoracic Navigator.  Lauren Allen, DNP, AGNP-C [facility] [phone] (work cell) [phone] (office) 06/09/17 3:22 PM   

## 2017-06-13 ENCOUNTER — Encounter: Payer: Self-pay | Admitting: *Deleted

## 2017-06-13 ENCOUNTER — Encounter: Payer: Self-pay | Admitting: Podiatry

## 2017-06-13 ENCOUNTER — Ambulatory Visit (INDEPENDENT_AMBULATORY_CARE_PROVIDER_SITE_OTHER): Payer: Medicare Other | Admitting: Podiatry

## 2017-06-13 ENCOUNTER — Ambulatory Visit (INDEPENDENT_AMBULATORY_CARE_PROVIDER_SITE_OTHER): Payer: Medicare Other

## 2017-06-13 DIAGNOSIS — M722 Plantar fascial fibromatosis: Secondary | ICD-10-CM

## 2017-06-13 DIAGNOSIS — R52 Pain, unspecified: Secondary | ICD-10-CM | POA: Diagnosis not present

## 2017-06-13 DIAGNOSIS — M659 Synovitis and tenosynovitis, unspecified: Secondary | ICD-10-CM | POA: Diagnosis not present

## 2017-06-13 DIAGNOSIS — M2020 Hallux rigidus, unspecified foot: Secondary | ICD-10-CM

## 2017-06-13 DIAGNOSIS — M205X9 Other deformities of toe(s) (acquired), unspecified foot: Secondary | ICD-10-CM

## 2017-06-13 DIAGNOSIS — M65979 Unspecified synovitis and tenosynovitis, unspecified ankle and foot: Secondary | ICD-10-CM

## 2017-06-13 MED ORDER — MELOXICAM 7.5 MG PO TABS: 7.5000 mg | ORAL_TABLET | Freq: Two times a day (BID) | ORAL | 0 refills | Status: DC

## 2017-06-13 NOTE — Progress Notes (Signed)
This patient to the office  For an evaluation of her painful feet.  She does give a history of having fibromyalgia and neuropathy.  She says that she is been having pain in her feet for over a year but it has gotten worse in the last few weeks.  She believes it may be related to her taking atorvastin.  She says she has generalized pain through the bottom of her foot.  She states that she wakes up in the morning and has pain which ultimately improves later  in the day.  She says she walks at home only with her house shoes and slippers.  She does admit that her feet feel better when she wears regular footgear when she leaves the house. She states she believes her pain is 6 out of 10.   She presents the office today for an evaluation and treatment of her painful feet.  General Appearance  Alert, conversant and in no acute stress.  Vascular  Dorsalis pedis and posterior tibial  pulses are palpable  bilaterally.  Capillary return is within normal limits  bilaterally. Temperature is within normal limits  bilaterally.  Neurologic  Senn-Weinstein monofilament wire test within normal limits  bilaterally. Muscle power within normal limits bilaterally.  Nails normal nails noted with no evidence of fungal or bacterial infection.  Orthopedic  No limitations of motion of motion feet .  No crepitus or effusions noted.  No bony pathology or digital deformities noted. 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  alpable pain noted on the dorsum of the first metatarsal phalangeal joint bilaterally.  Palpable pain noted at the insertion of the plantar fascia both feet  Skin  normotropic skin with no porokeratosis noted bilaterally.  No signs of infections or ulcers noted.   Plantar fasciitis secondary hallux limitus.  IE   X-rays reveal an elongated first metatarsal.  Narrowing noted at the level of the first MPJ bilaterally.  Osteophyte noted. Posterior to the heel left foot.  Discussed this condition with this patient.  Recommended  she wear power step insoles to help to relieve the ball of the plantar fascia both feet.  Prescribed Mobic for this patient.  RTC 3 weeks   Gardiner Barefoot DPM

## 2017-07-05 ENCOUNTER — Ambulatory Visit (INDEPENDENT_AMBULATORY_CARE_PROVIDER_SITE_OTHER): Payer: Medicare Other | Admitting: Podiatry

## 2017-07-05 ENCOUNTER — Encounter: Payer: Self-pay | Admitting: Podiatry

## 2017-07-05 ENCOUNTER — Other Ambulatory Visit: Payer: Self-pay | Admitting: Unknown Physician Specialty

## 2017-07-05 DIAGNOSIS — M659 Synovitis and tenosynovitis, unspecified: Secondary | ICD-10-CM | POA: Diagnosis not present

## 2017-07-05 DIAGNOSIS — M779 Enthesopathy, unspecified: Secondary | ICD-10-CM

## 2017-07-05 DIAGNOSIS — M778 Other enthesopathies, not elsewhere classified: Secondary | ICD-10-CM

## 2017-07-05 MED ORDER — METHYLPREDNISOLONE 4 MG PO TBPK: ORAL_TABLET | ORAL | 0 refills | Status: DC

## 2017-07-05 MED ORDER — MELOXICAM 15 MG PO TABS: 15.0000 mg | ORAL_TABLET | Freq: Every day | ORAL | 1 refills | Status: AC

## 2017-07-06 NOTE — Progress Notes (Signed)
   Subjective:  62 year old female presenting today with a chief complaint of right foot and ankle pain. She states the pain has improved since her previous visit but is still present at times. She has been taking Meloxicam with some relief and now needs a refill. Patient is here for further evaluation and treatment.   Past Medical History:  Diagnosis Date  . CAD (coronary artery disease)    Diagnosed on CT scan  . Depression   . Insomnia   . Menopause   . Osteoporosis   . Tremors of nervous system      Objective / Physical Exam:  General:  The patient is alert and oriented x3 in no acute distress. Dermatology:  Skin is warm, dry and supple bilateral lower extremities. Negative for open lesions or macerations. Vascular:  Palpable pedal pulses bilaterally. No edema or erythema noted. Capillary refill within normal limits. Neurological:  Epicritic and protective threshold grossly intact bilaterally.  Musculoskeletal Exam:  Pain on palpation to the anterior lateral medial aspects of the patient's right ankle. Mild edema noted. Pain with palpation to the dorsal aspect of the right midfoot. Range of motion within normal limits to all pedal and ankle joints bilateral. Muscle strength 5/5 in all groups bilateral.   Assessment: 1. pain in right ankle 2. synovitis of right ankle 3. Right midfoot capsulitis   Plan of Care:  1. Patient was evaluated. 2. injection of 0.5 mL Celestone Soluspan injected in the patient's right ankle. 3. Plantar fascial brace dispensed.  4. Prescription for Medrol Dose Pak provided to patient.  5. Refill prescription for Meloxicam provided to patient.  6. Return to clinic in 6 weeks.   Edrick Kins, DPM [facility]  Dr. Edrick Kins, [place]                                        [place], [place] [address]                Office [phone]  Fax [phone]

## 2017-07-07 NOTE — Telephone Encounter (Signed)
Hydroxyzine 25 MG tab refill Last Refill: 06/06/17 #0 Last OV: 05/30/17 PCP: Kathrine Haddock, NP [place]

## 2017-07-09 ENCOUNTER — Other Ambulatory Visit: Payer: Self-pay | Admitting: Unknown Physician Specialty

## 2017-07-25 ENCOUNTER — Telehealth: Payer: Self-pay | Admitting: Unknown Physician Specialty

## 2017-07-25 NOTE — Telephone Encounter (Signed)
I don't know how to do this- I'm happy to send it if you know how to do it.

## 2017-07-25 NOTE — Telephone Encounter (Signed)
Copied from [place] [id number]. Topic: Quick Communication - See Telephone Encounter >> Jul 25, 2017  1:43 PM Burchel, Abbi R wrote: CRM for notification. See Telephone encounter for: 07/25/17. Pt is using the [place] pt assistance program for estrogen, conjugated,-medroxyprogesterone (PREMPRO) 0.625-2.5 MG tablet. And needs is requesting a re-order through the program.

## 2017-07-26 NOTE — Telephone Encounter (Signed)
[facility] and reordered [facility]	[place]. Order number is [id number]. Next refill can be on 10/09/17. Will call patient and let her know.

## 2017-07-26 NOTE — Telephone Encounter (Signed)
Patient notified of prescription.

## 2017-08-01 ENCOUNTER — Other Ambulatory Visit: Payer: Self-pay | Admitting: Unknown Physician Specialty

## 2017-08-03 ENCOUNTER — Other Ambulatory Visit: Payer: Self-pay

## 2017-08-03 MED ORDER — HYDROXYZINE HCL 25 MG PO TABS: 25.0000 mg | ORAL_TABLET | Freq: Every day | ORAL | 0 refills | Status: DC

## 2017-08-04 NOTE — Telephone Encounter (Signed)
Duplicate request / Refilled:  Disp Refills Start End   hydrOXYzine (ATARAX/VISTARIL) 25 MG tablet 30 tablet 0 08/03/2017    Sig - Route: Take 1 tablet (25 mg total) by mouth daily. - Oral   Sent to pharmacy as: hydrOXYzine (ATARAX/VISTARIL) 25 MG tablet   E-Prescribing Status: Receipt confirmed by pharmacy (08/03/2017 4:37 PM EDT)

## 2017-08-08 ENCOUNTER — Telehealth: Payer: Self-pay | Admitting: Unknown Physician Specialty

## 2017-08-08 NOTE — Telephone Encounter (Signed)
Called patient to let her know the medication is at the office upfront. Placed in file cabinet up front

## 2017-08-09 ENCOUNTER — Ambulatory Visit (INDEPENDENT_AMBULATORY_CARE_PROVIDER_SITE_OTHER): Payer: Medicare Other | Admitting: Podiatry

## 2017-08-09 ENCOUNTER — Encounter: Payer: Self-pay | Admitting: Podiatry

## 2017-08-09 DIAGNOSIS — M659 Synovitis and tenosynovitis, unspecified: Secondary | ICD-10-CM

## 2017-08-09 MED ORDER — MELOXICAM 15 MG PO TABS: 15.0000 mg | ORAL_TABLET | Freq: Every day | ORAL | 1 refills | Status: AC

## 2017-08-15 ENCOUNTER — Telehealth: Payer: Self-pay | Admitting: Unknown Physician Specialty

## 2017-08-15 DIAGNOSIS — M25569 Pain in unspecified knee: Secondary | ICD-10-CM

## 2017-08-15 NOTE — Progress Notes (Signed)
   Subjective:  62 year old female presenting today for follow up evaluation of right ankle pain. She states she is doing well but experienced some pain last night that has since resolved. She has been wearing her insoles, taking Meloxicam and using CBD gel with some relief. Walking and standing for long periods of time increases the pain. Patient is here for further evaluation and treatment.   Past Medical History:  Diagnosis Date  . CAD (coronary artery disease)    Diagnosed on CT scan  . Depression   . Insomnia   . Menopause   . Osteoporosis   . Tremors of nervous system       Objective / Physical Exam:  General:  The patient is alert and oriented x3 in no acute distress. Dermatology:  Skin is warm, dry and supple bilateral lower extremities. Negative for open lesions or macerations. Vascular:  Palpable pedal pulses bilaterally. No edema or erythema noted. Capillary refill within normal limits. Neurological:  Epicritic and protective threshold grossly intact bilaterally.  Musculoskeletal Exam:  Pain on palpation to the anterior lateral medial aspects of the patient's right ankle. Mild edema noted. Range of motion within normal limits to all pedal and ankle joints bilateral. Muscle strength 5/5 in all groups bilateral.   Assessment: 1. pain in right ankle 2. synovitis of right ankle  Plan of Care:  1. Patient was evaluated. 2. Continue taking Meloxicam 15 mg as needed.  3. Continue wearing OTC insoles and wearing plantar fascial brace as needed.  4. Recommended good shoe gear.  5. Return to clinic as needed.   Edrick Kins, DPM [facility]  Dr. Edrick Kins, [address]                                        [place], [place] [address]                Office [phone]  Fax [phone]

## 2017-08-15 NOTE — Telephone Encounter (Signed)
Copied from [place] [id number]. Topic: Referral - Status >> Aug 12, 2017 11:16 AM Scherrie Gerlach wrote: Reason for CRM: pt states she stopped by the office the other day and had requested a referral to an orthopedic dr for left knee swelling. Pt has not heard anything and I do not see a referral. Please advise. Pt prefers to stay in [place] area  After July 23.  Pt has new [employer] gold, does not have card, but it should arrive any day  >> Aug 12, 2017  4:58 PM Don Perking M wrote: Can you take a look at this please? >> Aug 13, 2017  7:06 AM Robina Ade, Helene Kelp D wrote: Please ask [name] to put a referral in for her so I can get started on it please. >> Aug 15, 2017  8:13 AM Stark Klein wrote: [name], does this pt need to come in for an additional appt? --------------------------------------------------------------  Happy to put in a referral

## 2017-08-16 ENCOUNTER — Ambulatory Visit: Payer: Medicare Other | Admitting: Podiatry

## 2017-08-24 ENCOUNTER — Other Ambulatory Visit: Payer: Self-pay | Admitting: Podiatry

## 2017-08-24 DIAGNOSIS — M659 Synovitis and tenosynovitis, unspecified: Secondary | ICD-10-CM

## 2017-08-30 ENCOUNTER — Other Ambulatory Visit: Payer: Self-pay | Admitting: Unknown Physician Specialty

## 2017-09-01 NOTE — Telephone Encounter (Signed)
LOV 05-30-17 with Kathrine Haddock / Refill request for Atarax / Last filled:  Disp Refills Start End   hydrOXYzine (ATARAX/VISTARIL) 25 MG tablet 30 tablet 0 08/03/2017    Sig - Route: Take 1 tablet (25 mg total) by mouth daily. - Oral

## 2017-09-06 ENCOUNTER — Telehealth: Payer: Self-pay | Admitting: Unknown Physician Specialty

## 2017-09-06 DIAGNOSIS — L509 Urticaria, unspecified: Secondary | ICD-10-CM

## 2017-09-06 NOTE — Telephone Encounter (Signed)
Pt wants to see a dermatologist.  I need to know the reason

## 2017-09-06 NOTE — Telephone Encounter (Signed)
Called and left patient a VM asking for patient to please return my call. If patient calls back to the [facility], please find out the reason the patient is requesting the referral to dermatology.

## 2017-09-07 ENCOUNTER — Other Ambulatory Visit: Payer: Self-pay | Admitting: Family Medicine

## 2017-09-07 DIAGNOSIS — Z0100 Encounter for examination of eyes and vision without abnormal findings: Secondary | ICD-10-CM | POA: Diagnosis not present

## 2017-09-07 NOTE — Telephone Encounter (Signed)
Called and left patient a VM letting her know that the referral was entered for her.

## 2017-09-07 NOTE — Telephone Encounter (Signed)
Left message for patient to call to schedule appt for rash

## 2017-09-07 NOTE — Telephone Encounter (Signed)
Please call and schedule the patient an appointment per [facility]. Thanks.

## 2017-09-07 NOTE — Telephone Encounter (Signed)
Pt should be seen.

## 2017-09-07 NOTE — Telephone Encounter (Signed)
Pt called back and wanted to have a referral to the dermatologist. Offered to make appt with PCP but pt refused.

## 2017-09-07 NOTE — Telephone Encounter (Signed)
OK.  I will refer

## 2017-09-07 NOTE — Telephone Encounter (Signed)
Rx refill request: flexeril 10 mg       Last filled: 05/04/17  LOV: 05/30/17  PCP: [place]: verified

## 2017-09-07 NOTE — Addendum Note (Signed)
Addended by: Kathrine Haddock on: 09/07/2017 02:42 PM   Modules accepted: Orders

## 2017-09-07 NOTE — Telephone Encounter (Signed)
Called and left patient a VM asking for her to please return my call.  

## 2017-09-07 NOTE — Telephone Encounter (Signed)
Patient states she has a rash on her legs, arms, hands and back , She states that rash comes and goes.Very itchy. Please advise. Thank you

## 2017-09-14 DIAGNOSIS — M170 Bilateral primary osteoarthritis of knee: Secondary | ICD-10-CM | POA: Diagnosis not present

## 2017-09-26 ENCOUNTER — Other Ambulatory Visit: Payer: Self-pay | Admitting: Internal Medicine

## 2017-09-28 ENCOUNTER — Telehealth: Payer: Self-pay | Admitting: Family Medicine

## 2017-09-28 NOTE — Telephone Encounter (Signed)
Can we find out where she's been getting it? We haven't been giving it to her and if she's been off she may not need it anymore as her TSH has been normal

## 2017-09-28 NOTE — Telephone Encounter (Signed)
Refill of synthroid by historical provider  LOV 05/30/17 C. Julian Hy

## 2017-09-28 NOTE — Telephone Encounter (Signed)
Copied from [place] [id number]. Topic: Quick Communication - Rx Refill/Question >> Sep 28, 2017  3:17 PM Mcneil, Ja-Kwan wrote: Medication: levothyroxine (SYNTHROID, LEVOTHROID) 50 MCG tablet  Has the patient contacted their pharmacy? yes   Preferred Pharmacy (with phone number or street name): [facility], [place] [phone] (Phone) [phone] (Fax)  Agent: Please be advised that RX refills may take up to 3 business days. We ask that you follow-up with your pharmacy.

## 2017-09-29 NOTE — Telephone Encounter (Signed)
Called [facility]. They state that the only prescription they had on file for the patient was from August 2018 from a [facility] in [place]. They state that this was the last time the RX was filled and that it was only written for one month. They do not have any levothyroxine RX on file from [place]. Will call patient and discuss with her.

## 2017-09-29 NOTE — Telephone Encounter (Signed)
Called and spoke to patient. She states that Diane Pearson last filled her prescription about 3 months ago. I asked the patient who wrote the prescription and she stated that [name] did she thought. I explained that we do not have a record of writing the medication in her chart. Will call the pharmacy to find out more information as to who has been writing the prescription and when it was last written.

## 2017-09-29 NOTE — Telephone Encounter (Signed)
Called and left patient a VM asking for her to please return my call to discuss the medication.

## 2017-09-30 NOTE — Telephone Encounter (Signed)
Patient returned my call. I explained to her the information I got when speaking with Kristopher Oppenheim yesterday. Patient states that her bottle says that this RX was last written by Dr. Clayborn Bigness at the [facility] 07/2017 for #60. Patient states she takes 1 tablet daily. Please advise.

## 2017-10-02 ENCOUNTER — Other Ambulatory Visit: Payer: Self-pay | Admitting: Unknown Physician Specialty

## 2017-10-03 DIAGNOSIS — M170 Bilateral primary osteoarthritis of knee: Secondary | ICD-10-CM | POA: Diagnosis not present

## 2017-10-03 DIAGNOSIS — M1712 Unilateral primary osteoarthritis, left knee: Secondary | ICD-10-CM | POA: Diagnosis not present

## 2017-10-03 MED ORDER — LEVOTHYROXINE SODIUM 50 MCG PO TABS: 50.0000 ug | ORAL_TABLET | Freq: Every day | ORAL | 1 refills | Status: DC

## 2017-10-03 NOTE — Telephone Encounter (Signed)
Rx sent to her pharmacy 

## 2017-10-03 NOTE — Telephone Encounter (Signed)
Hyroxyzine refill Last Refill:09/01/17 #30 Last OV: 05/30/17 PCP: Dr. Wynetta Emery, former pt of Regino Schultze

## 2017-10-06 DIAGNOSIS — L308 Other specified dermatitis: Secondary | ICD-10-CM | POA: Diagnosis not present

## 2017-10-06 DIAGNOSIS — L853 Xerosis cutis: Secondary | ICD-10-CM | POA: Diagnosis not present

## 2017-10-06 DIAGNOSIS — D229 Melanocytic nevi, unspecified: Secondary | ICD-10-CM | POA: Diagnosis not present

## 2017-10-06 DIAGNOSIS — D485 Neoplasm of uncertain behavior of skin: Secondary | ICD-10-CM | POA: Diagnosis not present

## 2017-10-06 DIAGNOSIS — R21 Rash and other nonspecific skin eruption: Secondary | ICD-10-CM | POA: Diagnosis not present

## 2017-10-24 ENCOUNTER — Telehealth: Payer: Self-pay | Admitting: Family Medicine

## 2017-10-24 MED ORDER — CONJ ESTROG-MEDROXYPROGEST ACE 0.625-2.5 MG PO TABS: 1.0000 | ORAL_TABLET | Freq: Every day | ORAL | 3 refills | Status: DC

## 2017-10-24 NOTE — Telephone Encounter (Signed)
Copied from [place] [id number]. Topic: Quick Communication - Rx Refill/Question >> Oct 24, 2017  1:13 PM Scherrie Gerlach wrote: Medication: estrogen, conjugated,-medroxyprogesterone (PREMPRO) 0.625-2.5 MG tablet  Pt gets assistance with this med from [employer] and states it is time for a refill and the office has to fax them the request.

## 2017-10-24 NOTE — Telephone Encounter (Signed)
Patient ID # [id number]  Medication called in.

## 2017-10-24 NOTE — Telephone Encounter (Signed)
Pt called [employer]; states all that is needed to be done is to call in the medication; if needed contact pt back

## 2017-10-24 NOTE — Telephone Encounter (Signed)
I think that I just need to call the refills in, please let me know once they are refilled.

## 2017-10-24 NOTE — Telephone Encounter (Signed)
Number to contact for [employer] is [phone]

## 2017-10-24 NOTE — Telephone Encounter (Signed)
Order ID: [id number] Will take 7-10 business days Next fill date 12/31/17.

## 2017-10-24 NOTE — Addendum Note (Signed)
Addended by: Valerie Roys on: 10/24/2017 02:24 PM   Modules accepted: Orders

## 2017-10-24 NOTE — Telephone Encounter (Signed)
Patient will call and have form faxed to [place].

## 2017-10-24 NOTE — Telephone Encounter (Signed)
Rx written. OK to call in

## 2017-10-26 ENCOUNTER — Other Ambulatory Visit: Payer: Self-pay | Admitting: Family Medicine

## 2017-10-28 ENCOUNTER — Other Ambulatory Visit: Payer: Self-pay | Admitting: Unknown Physician Specialty

## 2017-10-31 NOTE — Telephone Encounter (Signed)
Ambien refill Last refill:03/29/17 #30/5 refill Last OV:05/30/17 GLO:VFIEPPI Pharmacy: [place], [place] [phone] (Phone) [phone] (Fax)

## 2017-11-29 ENCOUNTER — Encounter: Payer: Self-pay | Admitting: Emergency Medicine

## 2017-11-29 ENCOUNTER — Ambulatory Visit (INDEPENDENT_AMBULATORY_CARE_PROVIDER_SITE_OTHER): Payer: Medicare HMO | Admitting: Family Medicine

## 2017-11-29 ENCOUNTER — Other Ambulatory Visit: Payer: Self-pay | Admitting: Family Medicine

## 2017-11-29 ENCOUNTER — Ambulatory Visit: Payer: Medicare Other | Admitting: Unknown Physician Specialty

## 2017-11-29 ENCOUNTER — Other Ambulatory Visit: Payer: Self-pay

## 2017-11-29 ENCOUNTER — Encounter: Payer: Self-pay | Admitting: Family Medicine

## 2017-11-29 VITALS — BP 153/80 | HR 90 | Wt 158.0 lb

## 2017-11-29 DIAGNOSIS — E785 Hyperlipidemia, unspecified: Secondary | ICD-10-CM | POA: Insufficient documentation

## 2017-11-29 DIAGNOSIS — Z79899 Other long term (current) drug therapy: Secondary | ICD-10-CM | POA: Diagnosis not present

## 2017-11-29 DIAGNOSIS — E039 Hypothyroidism, unspecified: Secondary | ICD-10-CM | POA: Insufficient documentation

## 2017-11-29 DIAGNOSIS — F5101 Primary insomnia: Secondary | ICD-10-CM

## 2017-11-29 DIAGNOSIS — Z7982 Long term (current) use of aspirin: Secondary | ICD-10-CM | POA: Insufficient documentation

## 2017-11-29 DIAGNOSIS — R519 Headache, unspecified: Secondary | ICD-10-CM

## 2017-11-29 DIAGNOSIS — E782 Mixed hyperlipidemia: Secondary | ICD-10-CM | POA: Diagnosis not present

## 2017-11-29 DIAGNOSIS — F17210 Nicotine dependence, cigarettes, uncomplicated: Secondary | ICD-10-CM | POA: Diagnosis not present

## 2017-11-29 DIAGNOSIS — R339 Retention of urine, unspecified: Secondary | ICD-10-CM | POA: Diagnosis not present

## 2017-11-29 DIAGNOSIS — G629 Polyneuropathy, unspecified: Secondary | ICD-10-CM

## 2017-11-29 DIAGNOSIS — F332 Major depressive disorder, recurrent severe without psychotic features: Secondary | ICD-10-CM

## 2017-11-29 DIAGNOSIS — M797 Fibromyalgia: Secondary | ICD-10-CM | POA: Diagnosis not present

## 2017-11-29 DIAGNOSIS — I2510 Atherosclerotic heart disease of native coronary artery without angina pectoris: Secondary | ICD-10-CM | POA: Diagnosis not present

## 2017-11-29 DIAGNOSIS — R51 Headache: Secondary | ICD-10-CM

## 2017-11-29 MED ORDER — DULOXETINE HCL 60 MG PO CPEP: 60.0000 mg | ORAL_CAPSULE | Freq: Every day | ORAL | 1 refills | Status: DC

## 2017-11-29 MED ORDER — METOPROLOL SUCCINATE ER 50 MG PO TB24: 50.0000 mg | ORAL_TABLET | Freq: Every day | ORAL | 1 refills | Status: DC

## 2017-11-29 MED ORDER — PREGABALIN 75 MG PO CAPS: 75.0000 mg | ORAL_CAPSULE | Freq: Three times a day (TID) | ORAL | 1 refills | Status: DC

## 2017-11-29 MED ORDER — ZOLPIDEM TARTRATE 10 MG PO TABS: 10.0000 mg | ORAL_TABLET | Freq: Every day | ORAL | 5 refills | Status: DC

## 2017-11-29 NOTE — Progress Notes (Signed)
BP (!) 153/80   Pulse 90   Wt 158 lb (71.7 kg)   LMP  (LMP Unknown)   SpO2 98%   BMI 25.50 kg/m    Subjective:    Patient ID: Diane Pearson, female    DOB: 01/19/1956, 62 y.o.   MRN: [id number]  HPI: Diane Pearson is a 62 y.o. female  Chief Complaint  Patient presents with  . Follow-up  . Urinary Retention  . Migraine   URINARY SYMPTOMS- Feels like she needs to go to the bathroom, but then tries to go and nothing comes out for a while, then it will all come out at once.  Duration: couple of months- started around the time that she had someone staying with her Dysuria: no Urinary frequency: no Urgency: no Small volume voids: no Symptom severity: no Urinary incontinence: no Foul odor: no Hematuria: no Abdominal pain: no Back pain: no Suprapubic pain/pressure: no Flank pain: no Fever:  no Vomiting: no Relief with cranberry juice: no Relief with pyridium: no Status: stable Vaginal discharge: no Treatments attempted: none   Has been having headaches for about the past few weeks, seems to comes around this time of the year. She notes that she seems to be bad this time of year. She has been getting them daily- seems to be doing a bit better. Has some nausea with it and occasional  HYPOTHYROIDISM Thyroid control status:controlled Satisfied with current treatment? yes Medication side effects: no Medication compliance: excellent compliance Recent dose adjustment:no Fatigue: yes Cold intolerance: no Heat intolerance:yes  Weight gain: no Weight loss: no Constipation: no Diarrhea/loose stools: no Palpitations: no Lower extremity edema: yes- chronic Anxiety/depressed mood: yes- stable  HYPERLIPIDEMIA- stopped her atorvastatin due to knee pain. Has not been on it for  Hyperlipidemia status: stable Satisfied with current treatment?  yes Side effects:  yes Medication compliance: poor compliance Past cholesterol meds: atorvastatin Supplements: none Aspirin:   no The 10-year ASCVD risk score Mikey Bussing DC Jr., et al., 2013) is: 10.7%   Values used to calculate the score:     Age: 52 years     Sex: Female     Is Non-Hispanic African American: No     Diabetic: No     Tobacco smoker: Yes     Systolic Blood Pressure: 588 mmHg     Is BP treated: No     HDL Cholesterol: 42 mg/dL     Total Cholesterol: 181 mg/dL Chest pain:  no  DEPRESSION Mood status: controlled Satisfied with current treatment?: yes Symptom severity: moderate  Duration of current treatment : chronic Side effects: no Medication compliance: excellent compliance Psychotherapy/counseling: no  Previous psychiatric medications: cymbalta Depressed mood: yes Anxious mood: yes Anhedonia: no Significant weight loss or gain: no Insomnia: yes hard to fall asleep Fatigue: yes Feelings of worthlessness or guilt: no Impaired concentration/indecisiveness: no Suicidal ideations: no Hopelessness: no Crying spells: no Depression screen [facility] 2/9 11/29/2017 05/30/2017 04/16/2016 09/29/2015 09/25/2014  Decreased Interest 1 0 3 0 0  Down, Depressed, Hopeless 1 0 3 0 0  PHQ - 2 Score 2 0 6 0 0  Altered sleeping 2 3 2  - -  Tired, decreased energy 2 3 3  - -  Change in appetite 0 0 3 - -  Feeling bad or failure about yourself  0 0 0 - -  Trouble concentrating 3 3 2  - -  Moving slowly or fidgety/restless 3 2 3  - -  Suicidal thoughts 0 0 0 - -  PHQ-9 Score 12 11 19  - -  Difficult doing work/chores - Very difficult - - -   INSOMNIA- has been taking her ambien daily, sometimes taking 1/2-1 tab daily Duration: chronic Satisfied with sleep quality: no Difficulty falling asleep: yes Difficulty staying asleep: yes Waking a few hours after sleep onset: yes Early morning awakenings: no Daytime hypersomnolence: no Wakes feeling refreshed: no Good sleep hygiene: no Apnea: no Snoring: no Depressed/anxious mood: yes Recent stress: no Restless legs/nocturnal leg cramps: no Chronic pain/arthritis:  no History of sleep study: no Treatments attempted: melatonin, uinsom, benadryl and ambien   FIBROMYALGIA Pain status: controlled Satisfied with current treatment?: yes Medication side effects: no Medication compliance: excellent compliance Duration: chronic Location: widespread Quality: aching and sore Current pain level: mild Previous pain level: severe Aggravating factors: doing a lot, forgetting her medicine Alleviating factors: medicine Non-narcotic analgesic meds: yes Narcotic contract:no Treatments attempted: lyrica and ibuprofen     Relevant past medical, surgical, family and social history reviewed and updated as indicated. Interim medical history since our last visit reviewed. Allergies and medications reviewed and updated.  Review of Systems  Constitutional: Negative.   Respiratory: Negative.   Cardiovascular: Negative.   Gastrointestinal: Negative.   Genitourinary: Positive for decreased urine volume and difficulty urinating. Negative for dyspareunia, dysuria, enuresis, flank pain, frequency, genital sores, hematuria, menstrual problem, pelvic pain, urgency, vaginal bleeding, vaginal discharge and vaginal pain.  Musculoskeletal: Positive for arthralgias. Negative for back pain, gait problem, joint swelling, myalgias, neck pain and neck stiffness.  Skin: Negative.   Psychiatric/Behavioral: Positive for dysphoric mood and sleep disturbance. Negative for agitation, behavioral problems, confusion, decreased concentration, hallucinations, self-injury and suicidal ideas. The patient is nervous/anxious. The patient is not hyperactive.     Per HPI unless specifically indicated above     Objective:    BP (!) 153/80   Pulse 90   Wt 158 lb (71.7 kg)   LMP  (LMP Unknown)   SpO2 98%   BMI 25.50 kg/m   Wt Readings from Last 3 Encounters:  11/29/17 158 lb (71.7 kg)  06/09/17 160 lb (72.6 kg)  05/30/17 149 lb 12.8 oz (67.9 kg)    Physical Exam  Constitutional: She is  oriented to person, place, and time. She appears well-developed and well-nourished. No distress.  HENT:  Head: Normocephalic and atraumatic.  Right Ear: Hearing and external ear normal.  Left Ear: Hearing and external ear normal.  Nose: Nose normal.  Mouth/Throat: Oropharynx is clear and moist. No oropharyngeal exudate.  Eyes: Pupils are equal, round, and reactive to light. Conjunctivae, EOM and lids are normal. Right eye exhibits no discharge. Left eye exhibits no discharge. No scleral icterus.  Neck: Normal range of motion. Neck supple. No JVD present. No tracheal deviation present. No thyromegaly present.  Cardiovascular: Normal rate, regular rhythm, normal heart sounds and intact distal pulses. Exam reveals no gallop and no friction rub.  No murmur heard. Pulmonary/Chest: Effort normal and breath sounds normal. No stridor. No respiratory distress. She has no wheezes. She has no rales. She exhibits no tenderness.  Musculoskeletal: Normal range of motion.  Lymphadenopathy:    She has no cervical adenopathy.  Neurological: She is alert and oriented to person, place, and time.  Skin: Skin is warm, dry and intact. Capillary refill takes less than 2 seconds. No rash noted. She is not diaphoretic. No erythema. No pallor.  Psychiatric: She has a normal mood and affect. Her speech is normal and behavior is normal. Judgment and thought  content normal. Cognition and memory are normal.  Nursing note and vitals reviewed.   Results for orders placed or performed in visit on 05/30/17  Lipid Panel w/o Chol/HDL Ratio  Result Value Ref Range   Cholesterol, Total 181 100 - 199 mg/dL   Triglycerides 114 0 - 149 mg/dL   HDL 42 >39 mg/dL   VLDL Cholesterol Cal 23 5 - 40 mg/dL   LDL Calculated 116 (H) 0 - 99 mg/dL  Comprehensive metabolic panel  Result Value Ref Range   Glucose 86 65 - 99 mg/dL   BUN 18 8 - 27 mg/dL   Creatinine, Ser 0.86 0.57 - 1.00 mg/dL   GFR calc non Af Amer 74 >59 mL/min/1.73    GFR calc Af Amer 85 >59 mL/min/1.73   BUN/Creatinine Ratio 21 12 - 28   Sodium 141 134 - 144 mmol/L   Potassium 4.1 3.5 - 5.2 mmol/L   Chloride 104 96 - 106 mmol/L   CO2 23 20 - 29 mmol/L   Calcium 9.0 8.7 - 10.3 mg/dL   Total Protein 6.2 6.0 - 8.5 g/dL   Albumin 3.9 3.6 - 4.8 g/dL   Globulin, Total 2.3 1.5 - 4.5 g/dL   Albumin/Globulin Ratio 1.7 1.2 - 2.2   Bilirubin Total 0.3 0.0 - 1.2 mg/dL   Alkaline Phosphatase 84 39 - 117 IU/L   AST 20 0 - 40 IU/L   ALT 17 0 - 32 IU/L  CBC with Differential/Platelet  Result Value Ref Range   WBC 7.4 3.4 - 10.8 x10E3/uL   RBC 3.75 (L) 3.77 - 5.28 x10E6/uL   Hemoglobin 11.7 11.1 - 15.9 g/dL   Hematocrit 34.9 34.0 - 46.6 %   MCV 93 79 - 97 fL   MCH 31.2 26.6 - 33.0 pg   MCHC 33.5 31.5 - 35.7 g/dL   RDW 13.2 12.3 - 15.4 %   Platelets 240 150 - 379 x10E3/uL   Neutrophils 56 Not Estab. %   Lymphs 29 Not Estab. %   Monocytes 5 Not Estab. %   Eos 9 Not Estab. %   Basos 1 Not Estab. %   Neutrophils Absolute 4.1 1.4 - 7.0 x10E3/uL   Lymphocytes Absolute 2.1 0.7 - 3.1 x10E3/uL   Monocytes Absolute 0.4 0.1 - 0.9 x10E3/uL   EOS (ABSOLUTE) 0.7 (H) 0.0 - 0.4 x10E3/uL   Basophils Absolute 0.1 0.0 - 0.2 x10E3/uL   Immature Granulocytes 0 Not Estab. %   Immature Grans (Abs) 0.0 0.0 - 0.1 x10E3/uL  TSH  Result Value Ref Range   TSH 3.700 0.450 - 4.500 uIU/mL  Vitamin B12  Result Value Ref Range   Vitamin B-12 1,886 (H) 232 - 1,245 pg/mL      Assessment & Plan:   Problem List Items Addressed This Visit      Endocrine   Hypothyroid - Primary    Rechecking levels today. Await results. Adjust dose as needed.       Relevant Medications   metoprolol succinate (TOPROL-XL) 50 MG 24 hr tablet   Other Relevant Orders   TSH     Nervous and Auditory   Neuropathy    Stable on lyrica. Call with any concerns. Continue to monitor.         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 Continue current  regimen. Continue to monitor. Call with any concerns. Refills given.        Relevant Medications   DULoxetine (  CYMBALTA) 60 MG capsule   Insomnia    Stable on current regimen. Discussed that Lorrin Mais should really only be taken occasionally- not every night. Will work on taking it PRN rather than nightly. Will refill for 6 months. Call with any concerns.       Fibromyalgia    Doing well on her lyrica. Feeling well. Refills sent to her pharmacy. Call with any concerns.       Hyperlipidemia    Off her atorvastatin due to myalgias. Will check labs and start her on something else if warranted.       Relevant Medications   metoprolol succinate (TOPROL-XL) 50 MG 24 hr tablet   Other Relevant Orders   Lipid Panel w/o Chol/HDL Ratio   Comprehensive metabolic panel    Other Visit Diagnoses    Urinary retention       Offered referral to pelvic PT vs referral to urology. Will check urine. Call with any concerns. Referral to urology made today.   Relevant Orders   UA/M w/rflx Culture, Routine   Ambulatory referral to Urology   Sinus headache       Start allergy medicine OTC. Call with concerns.    Relevant Medications   DULoxetine (CYMBALTA) 60 MG capsule   metoprolol succinate (TOPROL-XL) 50 MG 24 hr tablet   pregabalin (LYRICA) 75 MG capsule       Follow up plan: Return in about 6 months (around 05/31/2018) for Physical/Wellness/6 month follow up.

## 2017-11-29 NOTE — Assessment & Plan Note (Signed)
Off her atorvastatin due to myalgias. Will check labs and start her on something else if warranted.

## 2017-11-29 NOTE — ED Triage Notes (Signed)
Patient ambulatory to triage with steady gait, without difficulty or distress noted; pt reports unable to void today

## 2017-11-29 NOTE — Assessment & Plan Note (Signed)
Stable on current regimen. Discussed that Diane Pearson should really only be taken occasionally- not every night. Will work on taking it PRN rather than nightly. Will refill for 6 months. Call with any concerns.

## 2017-11-29 NOTE — Telephone Encounter (Signed)
Requested medication (s) are due for refill today: yes  Requested medication (s) are on the active medication list: yes  Last refill:  09/08/17 #90  Future visit scheduled: no  Notes to clinic:      Requested Prescriptions  Pending Prescriptions Disp Refills   cyclobenzaprine (FLEXERIL) 10 MG tablet [Pharmacy Med Name: CYCLOBENZAPRINE 10 MG TABLET] 90 tablet 0    Sig: TAKE ONE TABLET BY MOUTH THREE TIMES A DAY FOR SPASMS     Not Delegated - Analgesics:  Muscle Relaxants Failed - 11/29/2017  4:52 PM      Failed - This refill cannot be delegated      Passed - Valid encounter within last 6 months    Recent Outpatient Visits          Today Hypothyroidism, unspecified type   [place], Megan P, DO   6 months ago Initial Medicare annual wellness visit   [facility] Kathrine Haddock, NP   1 year ago [place], NP   2 years ago Acute bacterial conjunctivitis of both eyes   [facility] Volney American, [place]   2 years ago Annual physical exam   [facility] Kathrine Haddock, NP

## 2017-11-29 NOTE — Assessment & Plan Note (Signed)
Rechecking levels today. Await results. Adjust dose as needed.  

## 2017-11-29 NOTE — ED Notes (Signed)
Bladder scan indicates 70ml

## 2017-11-29 NOTE — Assessment & Plan Note (Signed)
Under good control on current regimen. Continue current regimen. Continue to monitor. Call with any concerns. Refills given.   

## 2017-11-29 NOTE — Assessment & Plan Note (Signed)
Stable on lyrica. Call with any concerns. Continue to monitor.

## 2017-11-29 NOTE — Assessment & Plan Note (Signed)
Doing well on her lyrica. Feeling well. Refills sent to her pharmacy. Call with any concerns.

## 2017-11-29 NOTE — Patient Instructions (Signed)
Acute Urinary Retention, Female  Acute urinary retention is the temporary inability to urinate. This is an uncommon problem in women. It can be caused by:   Infection.   A side effect of a medicine.   A problem in a nearby organ that presses or squeezes on the bladder or the urethra (the tube that drains the bladder).   Psychological problems.   Surgery on your bladder, urethra, or pelvic organs that causes obstruction to the outflow of urine from your bladder.    Follow these instructions at home:  If you are sent home with a Foley catheter and a drainage system, you will need to discuss the best course of action with your health care provider. While the catheter is in, maintain a good intake of fluids. Keep the drainage bag emptied and lower than your catheter. This is so that contaminated urine will not flow back into your bladder, which could lead to a urinary tract infection.  There are two main types of drainage bags. One is a large bag that usually is used at night. It has a good capacity that will allow you to sleep through the night without having to empty it. The second type is called a leg bag. It has a smaller capacity so it needs to be emptied more frequently. However, the main advantage is that it can be attached by a leg strap and goes underneath your clothing, allowing you the freedom to move about or leave your home.  Only take over-the-counter or prescription medicines for pain, discomfort, or fever as directed by your health care provider.  Contact a health care provider if:   You develop a low-grade fever.   You experience spasms or leakage of urine with the spasms.  Get help right away if:   You develop chills or fever.   Your catheter stops draining urine.   Your catheter falls out.   You start to develop increased bleeding that does not respond to rest and increased fluid intake.  This information is not intended to replace advice given to you by your health care provider. Make sure  you discuss any questions you have with your health care provider.  Document Released: 01/24/2006 Document Revised: 07/09/2015 Document Reviewed: 07/06/2012  Elsevier Interactive Patient Education  2017 Elsevier Inc.

## 2017-11-30 ENCOUNTER — Emergency Department
Admission: EM | Admit: 2017-11-30 | Discharge: 2017-11-30 | Disposition: A | Discharge status: Home / Self Care | Payer: Medicare HMO | Attending: Emergency Medicine | Admitting: Emergency Medicine

## 2017-11-30 ENCOUNTER — Telehealth: Payer: Self-pay | Admitting: Family Medicine

## 2017-11-30 DIAGNOSIS — R339 Retention of urine, unspecified: Secondary | ICD-10-CM

## 2017-11-30 LAB — COMPREHENSIVE METABOLIC PANEL
ALT: 14 IU/L (ref 0–32)
AST: 17 IU/L (ref 0–40)
Albumin/Globulin Ratio: 1.7 (ref 1.2–2.2)
Albumin: 4 g/dL (ref 3.6–4.8)
Alkaline Phosphatase: 70 IU/L (ref 39–117)
BUN/Creatinine Ratio: 14 (ref 12–28)
BUN: 12 mg/dL (ref 8–27)
Bilirubin Total: 0.2 mg/dL (ref 0.0–1.2)
CO2: 27 mmol/L (ref 20–29)
Calcium: 9.2 mg/dL (ref 8.7–10.3)
Chloride: 103 mmol/L (ref 96–106)
Creatinine, Ser: 0.84 mg/dL (ref 0.57–1.00)
GFR calc Af Amer: 87 mL/min/{1.73_m2} (ref >59)
GFR calc non Af Amer: 76 mL/min/{1.73_m2} (ref >59)
Globulin, Total: 2.3 g/dL (ref 1.5–4.5)
Glucose: 81 mg/dL (ref 65–99)
Potassium: 5 mmol/L (ref 3.5–5.2)
Sodium: 139 mmol/L (ref 134–144)
Total Protein: 6.3 g/dL (ref 6.0–8.5)

## 2017-11-30 LAB — URINALYSIS, COMPLETE (UACMP) WITH MICROSCOPIC
Bilirubin Urine: NEGATIVE
Glucose, UA: NEGATIVE mg/dL
Ketones, ur: NEGATIVE mg/dL
Nitrite: NEGATIVE
Protein, ur: NEGATIVE mg/dL
Specific Gravity, Urine: 1.013 (ref 1.005–1.030)
pH: 6 (ref 5.0–8.0)

## 2017-11-30 LAB — LIPID PANEL W/O CHOL/HDL RATIO
Cholesterol, Total: 180 mg/dL (ref 100–199)
HDL: 45 mg/dL (ref >39)
LDL Calculated: 108 mg/dL — ABNORMAL HIGH (ref 0–99)
Triglycerides: 136 mg/dL (ref 0–149)
VLDL Cholesterol Cal: 27 mg/dL (ref 5–40)

## 2017-11-30 LAB — TSH: TSH: 2.76 u[IU]/mL (ref 0.450–4.500)

## 2017-11-30 MED ORDER — ALFUZOSIN HCL ER 10 MG PO TB24: 10.0000 mg | ORAL_TABLET | Freq: Every day | ORAL | 0 refills | Status: DC

## 2017-11-30 NOTE — Discharge Instructions (Signed)
There are multiple reasons why woman might have difficulty initiating urination.  Today it does not look like you have a urinary tract infection and you are not dehydrated.  It is possible this could be related to a number of medications you have been taking which are anticholinergic.  It is also possible that there could be an anatomic reason so in addition to the new medication I am prescribing I think is a good idea for you to follow-up with the [facility] gynecologist for a recheck.  Please return to the emergency department sooner for any concerns whatsoever.  It was a pleasure to take care of you today, and thank you for coming to our emergency department.  If you have any questions or concerns before leaving please ask the nurse to grab me and I'm more than happy to go through your aftercare instructions again.  If you were prescribed any opioid pain medication today such as Norco, Vicodin, Percocet, morphine, hydrocodone, or oxycodone please make sure you do not drive when you are taking this medication as it can alter your ability to drive safely.  If you have any concerns once you are home that you are not improving or are in fact getting worse before you can make it to your follow-up appointment, please do not hesitate to call 911 and come back for further evaluation.  Darel Hong, MD  Results for orders placed or performed during the hospital encounter of 11/30/17  Urinalysis, Complete w Microscopic  Result Value Ref Range   Color, Urine YELLOW (A) YELLOW   APPearance CLEAR (A) CLEAR   Specific Gravity, Urine 1.013 1.005 - 1.030   pH 6.0 5.0 - 8.0   Glucose, UA NEGATIVE NEGATIVE mg/dL   Hgb urine dipstick SMALL (A) NEGATIVE   Bilirubin Urine NEGATIVE NEGATIVE   Ketones, ur NEGATIVE NEGATIVE mg/dL   Protein, ur NEGATIVE NEGATIVE mg/dL   Nitrite NEGATIVE NEGATIVE   Leukocytes, UA TRACE (A) NEGATIVE   RBC / HPF 6-10 0 - 5 RBC/hpf   WBC, UA 0-5 0 - 5 WBC/hpf   Bacteria, UA RARE (A) NONE  SEEN   Squamous Epithelial / LPF 0-5 0 - 5   Mucus PRESENT    Hyaline Casts, UA PRESENT

## 2017-11-30 NOTE — ED Provider Notes (Signed)
[facility] Emergency Department Provider Note  ____________________________________________   First MD Initiated Contact with Patient 11/30/17 0119     (approximate)  I have reviewed the triage vital signs and the nursing notes.   HISTORY  Chief Complaint Urinary Retention   HPI Diane MCEUEN is a 62 y.o. female self presents to the emergency department with 1 day of difficulty urinating.  She says she is only urinated once today and when she sits down to urinate she feels that she has to but is unable to get anything out.  She has no change in her medications recently although she does take multiple medications chronically which are anticholinergic.   She takes no opioid pain medication.  She denies fevers or chills.  She denies chest pain or shortness of breath.  She denies dysuria when actually urinating.  She has never had this issue before.  Her symptoms came on suddenly are currently moderate severity and nothing seems to make them better or worse.   Past Medical History:  Diagnosis Date  . CAD (coronary artery disease)    Diagnosed on CT scan  . Depression   . Insomnia   . Menopause   . Osteoporosis   . Tremors of nervous system     Patient Active Problem List   Diagnosis Date Noted  . Hyperlipidemia 11/29/2017  . Hypothyroid 05/30/2017  . Advance care planning 05/30/2017  . History of abnormal cervical Pap smear 03/03/2015  . Neuropathy 09/25/2014  . Benign essential tremor 05/20/2014  . Recurrent major depression ([place]) 05/20/2014  . Insomnia 05/20/2014  . Osteoporosis 05/20/2014  . Symptomatic menopausal or female climacteric states 05/20/2014  . Fibromyalgia 05/20/2014  . Atopic rhinitis 05/20/2014  . Tobacco use 05/20/2014    Past Surgical History:  Procedure Laterality Date  . BREAST BIOPSY Left 04/20/2016   COLUMNAR CELL CHANGE  . varicose veins  1998    Prior to Admission medications   Medication Sig Start Date End Date  Taking? Authorizing Provider  alfuzosin (UROXATRAL) 10 MG 24 hr tablet Take 1 tablet (10 mg total) by mouth daily with breakfast. 11/30/17   Darel Hong, MD  aspirin 81 MG tablet Take 81 mg by mouth daily.    [provider]  Calcium Carbonate-Vitamin D (CALCIUM 600+D PO) Take 600 mg by mouth daily.    [provider]  cholecalciferol (VITAMIN D) 1000 UNITS tablet Take 1,000 Units by mouth daily.    [provider]  cyclobenzaprine (FLEXERIL) 10 MG tablet TAKE ONE TABLET BY MOUTH THREE TIMES A DAY FOR SPASMS 11/30/17   Johnson, Megan P, DO  DULoxetine (CYMBALTA) 60 MG capsule Take 1 capsule (60 mg total) by mouth daily. 11/29/17   Johnson, Megan P, DO  estrogen, conjugated,-medroxyprogesterone (PREMPRO) 0.625-2.5 MG tablet Take 1 tablet by mouth daily. 10/24/17   Johnson, Megan P, DO  hydrOXYzine (ATARAX/VISTARIL) 25 MG tablet TAKE ONE TABLET BY MOUTH DAILY 10/26/17   Wynetta Emery, Megan P, DO  ibuprofen (ADVIL,MOTRIN) 800 MG tablet Take 800 mg by mouth at bedtime as needed.    [provider]  levothyroxine (SYNTHROID, LEVOTHROID) 50 MCG tablet Take 1 tablet (50 mcg total) by mouth daily before breakfast. 10/03/17   Park Liter P, DO  lidocaine (XYLOCAINE) 5 % ointment Apply 1 application topically 3 (three) times daily as needed.  06/19/15   [provider]  Magnesium 500 MG CAPS Take 500 mg by mouth daily.    [provider]  metoprolol  succinate (TOPROL-XL) 50 MG 24 hr tablet Take 1 tablet (50 mg total) by mouth daily. Take with or immediately following a meal. 11/29/17   Johnson, Megan P, DO  Potassium 99 MG TABS Take 99 mg by mouth daily.    [provider]  pregabalin (LYRICA) 75 MG capsule Take 1 capsule (75 mg total) by mouth 3 (three) times daily. 11/29/17   Johnson, Megan P, DO  vitamin B-12 (CYANOCOBALAMIN) 500 MCG tablet Take 500 mcg by mouth daily.    [provider]  zolpidem (AMBIEN) 10 MG tablet Take 1 tablet (10  mg total) by mouth at bedtime. 11/29/17   Park Liter P, DO    Allergies Erythromycin; Gabapentin; Tamiflu [oseltamivir phosphate]; Tramadol; and Nitrofurantoin  Family History  Problem Relation Age of Onset  . Cancer Father        Lung  . Breast cancer Maternal Grandmother 84  . Ulcerative colitis Sister   . Anxiety disorder Daughter   . Alzheimer's disease Maternal Grandfather   . Heart disease Paternal Grandmother        MI  . Heart disease Paternal Grandfather        MI  . Breast cancer Maternal Aunt        70's    Social History Social History   Tobacco Use  . Smoking status: Current Every Day Smoker    Packs/day: 1.00    Years: 45.00    Pack years: 45.00    Types: Cigarettes  . Smokeless tobacco: Never Used  Substance Use Topics  . Alcohol use: No    Alcohol/week: 0.0 standard drinks  . Drug use: No    Review of Systems Constitutional: No fever/chills Eyes: No visual changes. ENT: No sore throat. Cardiovascular: Denies chest pain. Respiratory: Denies shortness of breath. Gastrointestinal: No abdominal pain.  No nausea, no vomiting.  No diarrhea.  No constipation. Genitourinary: Positive for urinary retention Musculoskeletal: Negative for back pain. Skin: Negative for rash. Neurological: Negative for headaches, focal weakness or numbness.   ____________________________________________   PHYSICAL EXAM:  VITAL SIGNS: ED Triage Vitals  Enc Vitals Group     BP 11/29/17 2152 (!) 136/98     Pulse Rate 11/29/17 2152 78     Resp 11/29/17 2152 18     Temp 11/29/17 2152 97.9 F (36.6 C)     Temp Source 11/29/17 2152 Oral     SpO2 11/29/17 2152 97 %     Weight 11/29/17 2151 158 lb 1.1 oz (71.7 kg)     Height 11/29/17 2151 5\' 6"  (1.676 m)     Head Circumference --      Peak Flow --      Pain Score 11/29/17 2152 2     Pain Loc --      Pain Edu? --      Excl. in [place]? --     Constitutional: Alert and oriented x4 appears somewhat anxious but nontoxic  no diaphoresis speaks in full clear sentences Eyes: PERRL EOMI. Head: Atraumatic. Nose: No congestion/rhinnorhea. Mouth/Throat: No trismus Neck: No stridor.   Cardiovascular: Normal rate, regular rhythm. Grossly normal heart sounds.  Good peripheral circulation. Respiratory: Normal respiratory effort.  No retractions. Lungs CTAB and moving good air Gastrointestinal: Soft nondistended nontender no rebound or guarding no peritonitis Musculoskeletal: No lower extremity edema   Neurologic:  Normal speech and language. No gross focal neurologic deficits are appreciated. Skin:  Skin is warm, dry and intact. No rash noted. Psychiatric: Mildly anxious appearing  ____________________________________________   DIFFERENTIAL includes but not limited to  Urinary tract infection, interstitial cystitis, structural urinary retention, anticholinergic urinary retention ____________________________________________   LABS (all labs ordered are listed, but only abnormal results are displayed)  Labs Reviewed  URINALYSIS, COMPLETE (UACMP) WITH MICROSCOPIC - Abnormal; Notable for the following components:      Result Value   Color, Urine YELLOW (*)    APPearance CLEAR (*)    Hgb urine dipstick SMALL (*)    Leukocytes, UA TRACE (*)    Bacteria, UA RARE (*)    All other components within normal limits    Lab work reviewed by me with no signs of infection __________________________________________  EKG   ____________________________________________  RADIOLOGY   ____________________________________________   PROCEDURES  Procedure(s) performed: no  Procedures  Critical Care performed: no  ____________________________________________   INITIAL IMPRESSION / ASSESSMENT AND PLAN / ED COURSE  Pertinent labs & imaging results that were available during my care of the patient were reviewed by me and considered in my medical decision making (see chart for details).   As part of my  medical decision making, I reviewed the following data within the [place] History obtained from family if available, nursing notes, old chart and ekg, as well as notes from prior ED visits.  The patient came to the emergency department stating that she had urinary retention however bladder scan only showed 62 cc of urine and she was subsequently able to urinate.  Had a lengthy discussion with the patient regarding the diagnostic uncertainty but that it does not appear that she has an infection at this time.  Her symptoms could be related to anticholinergics versus a structural issue like a cystocele etc.  I will give her a short trial of uroxatral and refer her back to primary care as well as OB gynecology for evaluation for structural issues.  She is discharged home in improved condition verbalizes understanding and agreement with the plan.      ____________________________________________   FINAL CLINICAL IMPRESSION(S) / ED DIAGNOSES  Final diagnoses:  Urinary retention      NEW MEDICATIONS STARTED DURING THIS VISIT:  Discharge Medication List as of 11/30/2017  2:12 AM    START taking these medications   Details  alfuzosin (UROXATRAL) 10 MG 24 hr tablet Take 1 tablet (10 mg total) by mouth daily with breakfast., Starting Wed 11/30/2017, Print         Note:  This document was prepared using Dragon voice recognition software and may include unintentional dictation errors.     Darel Hong, MD 12/02/17 2135

## 2017-11-30 NOTE — Telephone Encounter (Signed)
Copied from [place] [phone]. Topic: Quick Communication - See Telephone Encounter >> Nov 30, 2017  2:50 PM Diane Pearson wrote: CRM for notification. See Telephone encounter for: 11/30/17.  Pt states she will not be bringing  her urine sample by today because she went to the ER last night since she couldn't collect it on her own.  States ER told her no urinary tract infection was seen.  She wanted to update provider.

## 2017-12-01 NOTE — Telephone Encounter (Signed)
If she had a urine done at the ER, she doesn't need to bring a urine in today. I've put the referral in for the urologist- they should be able to help with the retention. Thanks!

## 2017-12-01 NOTE — Telephone Encounter (Signed)
Called and left a detailed message.

## 2017-12-05 ENCOUNTER — Telehealth: Payer: Self-pay

## 2017-12-05 NOTE — Telephone Encounter (Signed)
Fax from [place] that medications be sent to [facility].  Medications are: Zolpidem (they faxed a form for a controlled substance) Metoprolol  Hydroxizine  Cyclobenazaprine  Medications last got sent 11/29/2017 to [facility].

## 2017-12-05 NOTE — Telephone Encounter (Signed)
Ambien will not be sent to mail order. She asked me to send them to [facility]- not sure if she actually needs this. Please check.

## 2017-12-05 NOTE — Telephone Encounter (Signed)
Called and left a message asking patient to return my call.  CRM Created Please find out if she requested these to be sent to [facility] and let her know that Ambien will not be sent to a mail order.

## 2017-12-06 ENCOUNTER — Telehealth: Payer: Self-pay | Admitting: Family Medicine

## 2017-12-06 DIAGNOSIS — R339 Retention of urine, unspecified: Secondary | ICD-10-CM

## 2017-12-06 NOTE — Telephone Encounter (Signed)
Called and left patient a VM asking for her to please return my call. OK for [facility] to speak to patient and find out why she is needing the referral to OBGYN.

## 2017-12-06 NOTE — Telephone Encounter (Signed)
I'm happy to put in the referral. Please find out why she needs to see GYN

## 2017-12-06 NOTE — Telephone Encounter (Signed)
Left message on machine for pt to return call to the office.  

## 2017-12-06 NOTE — Telephone Encounter (Signed)
Patient says she was seen in the ED and was advised by the ED Provider  to see a OBGYN because  she was unable to urinate.She has been having problems for about a month,and  she was told to see a urologist , and also a OBGYN , she has a appointment with a urologist on 11/ 5/19 .,

## 2017-12-06 NOTE — Telephone Encounter (Signed)
Copied from [place] [id number]. Topic: Referral - Request for Referral >> Dec 06, 2017 11:17 AM Yvette Rack wrote: Has patient seen PCP for this complaint? no *If NO, is insurance requiring patient see PCP for this issue before PCP can refer them? Pt states insurance does not require appt with pcp Referral for which specialty: OBGYN Preferred provider/office: No preferred location  Reason for referral: Pt states when she went to the hospital she was told to get a referral to an OBGYN

## 2017-12-06 NOTE — Telephone Encounter (Signed)
Pt. Called back and informed Ambien sent to [facility]. Does not need any other refills at this time.

## 2017-12-06 NOTE — Telephone Encounter (Signed)
Routing to provider  

## 2017-12-08 ENCOUNTER — Telehealth: Payer: Self-pay | Admitting: Obstetrics & Gynecology

## 2017-12-08 NOTE — Telephone Encounter (Signed)
Dr. Wynetta Emery, what do I do about this situation? I didn't transfer any medications with the faxes received to [facility].

## 2017-12-08 NOTE — Telephone Encounter (Signed)
[employer] calling to ask if you could send the  hydrOXYzine (ATARAX/VISTARIL) 25 MG tablet cyclobenzaprine (FLEXERIL) 10 MG tablet alfuzosin (UROXATRAL) 10 MG 24 hr tablet  They were unable to get these transferred.  Also, needed a 90 day each.  FYI  [employer] advised they did get the ambien transferred to them) [phone]  Or fax to [phone]   [facility] - [place], [place] [phone] (Phone) [phone] (Fax)

## 2017-12-08 NOTE — Telephone Encounter (Signed)
Can you check with Vickie about this? She told me that she wanted it local- does she want it at [facility]?

## 2017-12-08 NOTE — Telephone Encounter (Signed)
Left message on machine for pt to return call to the office.  

## 2017-12-08 NOTE — Telephone Encounter (Signed)
[facility] referring for Urinary retention. Called and left voicemail for patient to call back to be schedule

## 2017-12-09 ENCOUNTER — Telehealth: Payer: Self-pay | Admitting: *Deleted

## 2017-12-09 NOTE — Telephone Encounter (Signed)
Pt is returning Diane Pearson call

## 2017-12-09 NOTE — Telephone Encounter (Signed)
Called and left a message for patient to see where she wants her medications to go to and what medications she needs. [place] for nurse triage to discuss this. CRM Created

## 2017-12-09 NOTE — Telephone Encounter (Signed)
NOt due for any refills. Will send to [facility] when due.

## 2017-12-09 NOTE — Telephone Encounter (Signed)
Patient returned call- not sure if about the referral or the medications- left message for patient to call back to review.

## 2017-12-09 NOTE — Telephone Encounter (Signed)
Another telephone encounter created to resolve issue.

## 2017-12-09 NOTE — Telephone Encounter (Signed)
Pt states that she would like all medications on file sent to [facility] now other than Prempro.  [place], [place] [phone] (Phone) [phone] (Fax)

## 2017-12-09 NOTE — Telephone Encounter (Signed)
TC to patient. She does not have any of her medications coming from [facility]). All medication refills should be sent to the [facility] on file, for now except Prempro and Lyrica which she picks up at PCP because it comes directly from [employer]. Provided her with [facility] # to inquire about having mediations switched to mail order. I requested she notify the office if she desires the change due to all the recent communication regarding the matter. Does not need any refills at this time. She has appointments with GYN on 11/06 and Urologist 11/05 but stated she is no longer having the urinary retention but would still like to be evaluated.

## 2017-12-09 NOTE — Telephone Encounter (Signed)
Please see messages below.

## 2017-12-09 NOTE — Telephone Encounter (Signed)
Called and left a message for patient to return my call.  

## 2017-12-13 ENCOUNTER — Ambulatory Visit (INDEPENDENT_AMBULATORY_CARE_PROVIDER_SITE_OTHER): Payer: Medicare HMO | Admitting: Urology

## 2017-12-13 ENCOUNTER — Encounter: Payer: Self-pay | Admitting: Urology

## 2017-12-13 VITALS — BP 151/101 | HR 77 | Ht 66.0 in | Wt 161.3 lb

## 2017-12-13 DIAGNOSIS — R3129 Other microscopic hematuria: Secondary | ICD-10-CM | POA: Diagnosis not present

## 2017-12-13 DIAGNOSIS — R3914 Feeling of incomplete bladder emptying: Secondary | ICD-10-CM | POA: Diagnosis not present

## 2017-12-13 DIAGNOSIS — R339 Retention of urine, unspecified: Secondary | ICD-10-CM

## 2017-12-13 LAB — BLADDER SCAN AMB NON-IMAGING: Scan Result: 11

## 2017-12-13 NOTE — Progress Notes (Signed)
12/13/2017 12:15 PM   [name] 1955-10-06 [id number]  Referring provider: Valerie Roys, DO [place], [place] [address]  No chief complaint on file.   HPI: Patient is a 62 year old Caucasian female who was referred by [facility] ED for feeling of incomplete bladder emptying.   She presented to the emergency room on November 30, 2017 with a complaint of 1 day of difficulty urinating.  She states that she only urinated once, but she continued to have urinary urgency with low output.   Bladder scan in the ED noted only 62 cc.    Labs in the ED:  UA with 6-10 RBC's and 0-5 WBC's   Meds given in the ED: Alfuzosin 10 mg daily  Prior urological history:  None.     Current NSAID/anticoagulation:   Mobic 15 mg daily    Today, she is experiencing nocturia x 1 and mild SUI and post void dribbling.  She states the symptoms of  intermittency, hesitancy, and straining to urinate six months ago.  Patient denies any gross hematuria, dysuria or suprapubic/flank pain.  Patient denies any fevers, chills, nausea or vomiting.  Her UA today is 3-10 RBC's.   Her PVR is 11 mL.    She states that 6 months ago, she also has tenesmus.      PMH: Past Medical History:  Diagnosis Date  . Arthritis   . CAD (coronary artery disease)    Diagnosed on CT scan  . Depression   . Depression   . Hypothyroidism   . Insomnia   . Menopause   . Osteoporosis   . Tremors of nervous system     Surgical History: Past Surgical History:  Procedure Laterality Date  . BREAST BIOPSY Left 04/20/2016   COLUMNAR CELL CHANGE  . varicose veins  1998    Home Medications:  Allergies as of 12/13/2017      Reactions   Erythromycin Other (See Comments)   cramps   Gabapentin Other (See Comments)   tremors   Tamiflu [oseltamivir Phosphate] Other (See Comments)   Cramps and stomach issues   Tramadol    headaches   Nitrofurantoin Diarrhea, Nausea And Vomiting      Medication List        Accurate as  of 12/13/17 12:15 PM. Always use your most recent med list.          alfuzosin 10 MG 24 hr tablet Commonly known as:  UROXATRAL Take 1 tablet (10 mg total) by mouth daily with breakfast.   aspirin 81 MG tablet Take 81 mg by mouth daily.   CALCIUM 600+D PO Take 600 mg by mouth daily.   cholecalciferol 1000 units tablet Commonly known as:  VITAMIN D Take 1,000 Units by mouth daily.   cyclobenzaprine 10 MG tablet Commonly known as:  FLEXERIL TAKE ONE TABLET BY MOUTH THREE TIMES A DAY FOR SPASMS   DULoxetine 60 MG capsule Commonly known as:  CYMBALTA Take 1 capsule (60 mg total) by mouth daily.   estrogen (conjugated)-medroxyprogesterone 0.625-2.5 MG tablet Commonly known as:  PREMPRO Take 1 tablet by mouth daily.   hydrOXYzine 25 MG tablet Commonly known as:  ATARAX/VISTARIL TAKE ONE TABLET BY MOUTH DAILY   ibuprofen 800 MG tablet Commonly known as:  ADVIL,MOTRIN Take 800 mg by mouth at bedtime as needed.   levothyroxine 50 MCG tablet Commonly known as:  SYNTHROID, LEVOTHROID Take 1 tablet (50 mcg total) by mouth daily before breakfast.   lidocaine  5 % ointment Commonly known as:  XYLOCAINE Apply 1 application topically 3 (three) times daily as needed.   Magnesium 400 MG Tabs Take 400 mg by mouth daily.   Magnesium 500 MG Caps Take 500 mg by mouth daily.   metoprolol succinate 50 MG 24 hr tablet Commonly known as:  TOPROL-XL Take 1 tablet (50 mg total) by mouth daily. Take with or immediately following a meal.   Potassium 99 MG Tabs Take 99 mg by mouth daily.   pregabalin 75 MG capsule Commonly known as:  LYRICA Take 1 capsule (75 mg total) by mouth 3 (three) times daily.   vitamin B-12 500 MCG tablet Commonly known as:  CYANOCOBALAMIN Take 500 mcg by mouth daily.   zolpidem 10 MG tablet Commonly known as:  AMBIEN Take 1 tablet (10 mg total) by mouth at bedtime.       Allergies:  Allergies  Allergen Reactions  . Erythromycin Other (See  Comments)    cramps  . Gabapentin Other (See Comments)    tremors  . Tamiflu [Oseltamivir Phosphate] Other (See Comments)    Cramps and stomach issues   . Tramadol     headaches  . Nitrofurantoin Diarrhea and Nausea And Vomiting    Family History: Family History  Problem Relation Age of Onset  . Cancer Father        Lung  . Tuberculosis Father   . Breast cancer Maternal Grandmother 53  . Ulcerative colitis Sister   . Anxiety disorder Daughter   . Alzheimer's disease Maternal Grandfather   . Heart disease Paternal Grandmother        MI  . Heart disease Paternal Grandfather        MI  . Breast cancer Maternal Aunt        70's    Social History:  reports that she has been smoking cigarettes. She has a 45.00 pack-year smoking history. She has never used smokeless tobacco. She reports that she does not drink alcohol or use drugs.  ROS: UROLOGY Frequent Urination?: No Hard to postpone urination?: No Burning/pain with urination?: No Get up at night to urinate?: Yes Leakage of urine?: Yes Urine stream starts and stops?: Yes Trouble starting stream?: Yes Do you have to strain to urinate?: Yes Blood in urine?: No Urinary tract infection?: No Sexually transmitted disease?: No Injury to kidneys or bladder?: No Painful intercourse?: No Weak stream?: No Currently pregnant?: No Vaginal bleeding?: No Last menstrual period?: n  Gastrointestinal Nausea?: Yes Vomiting?: Yes Indigestion/heartburn?: Yes Diarrhea?: No Constipation?: No  Constitutional Fever: No Night sweats?: No Weight loss?: No Fatigue?: Yes  Skin Skin rash/lesions?: Yes Itching?: Yes  Eyes Blurred vision?: Yes Double vision?: No  Ears/Nose/Throat Sore throat?: No Sinus problems?: Yes  Hematologic/Lymphatic Swollen glands?: No Easy bruising?: No  Cardiovascular Leg swelling?: Yes Chest pain?: No  Respiratory Cough?: No Shortness of breath?: No  Endocrine Excessive thirst?:  Yes  Musculoskeletal Back pain?: Yes Joint pain?: Yes  Neurological Headaches?: Yes Dizziness?: Yes  Psychologic Depression?: No Anxiety?: No  Physical Exam: BP (!) 151/101 (BP Location: Left Arm, Patient Position: Sitting, Cuff Size: Normal)   Pulse 77   Ht 5\' 6"  (1.676 m)   Wt 161 lb 4.8 oz (73.2 kg)   LMP  (LMP Unknown)   BMI 26.03 kg/m   Constitutional: Well nourished. Alert and oriented, No acute distress. HEENT: [place] AT, moist mucus membranes. Trachea midline, no masses. Cardiovascular: No clubbing, cyanosis, or edema. Respiratory: Normal respiratory effort, no  increased work of breathing. GI: Abdomen is soft, non tender, non distended, no abdominal masses. Liver and spleen not palpable.  No hernias appreciated.  Stool sample for occult testing is not indicated.   GU: No CVA tenderness.  No bladder fullness or masses.  Atrophic external genitalia, normal pubic hair distribution, no lesions.  Normal urethral meatus, no lesions, no prolapse, no discharge.   No urethral masses, tenderness and/or tenderness. No bladder fullness, tenderness or masses. Pale vagina mucosa, fair estrogen effect, no discharge, no lesions, poor pelvic support, Grade II cystocele and rectocele noted.  ? Cervical polyp.  Seeing gynecology tomorrow.  No cervical motion tenderness.  Uterus is freely mobile and non-fixed.  No adnexal/parametria masses or tenderness noted.  Anus and perineum are without rashes or lesions.    Skin: No rashes, bruises or suspicious lesions. Lymph: No cervical or inguinal adenopathy. Neurologic: Grossly intact, no focal deficits, moving all 4 extremities. Psychiatric: Normal mood and affect.  Laboratory Data: Lab Results  Component Value Date   WBC 7.4 05/30/2017   HGB 11.7 05/30/2017   HCT 34.9 05/30/2017   MCV 93 05/30/2017   PLT 240 05/30/2017    Lab Results  Component Value Date   CREATININE 0.84 11/29/2017    No results found for: PSA  No results found for:  TESTOSTERONE  No results found for: HGBA1C  Lab Results  Component Value Date   TSH 2.760 11/29/2017       Component Value Date/Time   CHOL 180 11/29/2017 1406   HDL 45 11/29/2017 1406   LDLCALC 108 (H) 11/29/2017 1406    Lab Results  Component Value Date   AST 17 11/29/2017   Lab Results  Component Value Date   ALT 14 11/29/2017   No components found for: ALKALINEPHOPHATASE No components found for: BILIRUBINTOTAL  No results found for: ESTRADIOL  Urinalysis 3-10 RBC's.  Moderate bacteria.  See Epic.  I have reviewed the labs.   Pertinent Imaging: Results for WANDY, BOSSLER (MRN [id number]) as of 12/13/2017 12:18  Ref. Range 12/13/2017 11:41  Scan Result Unknown 11     Assessment & Plan:    1. Microscopic hematuria  I explained to the patient that there are a number of causes that can be associated with blood in the urine, such as stones, UTI's, damage to the urinary tract and/or cancer. At this time, I felt that the patient warranted further urologic evaluation with 3 or greater RBC's/hpf on microscopic evaluation of the urine.  The AUA guidelines state that a CT urogram is the preferred imaging study to evaluate hematuria. I explained to the patient that a contrast material will be injected into a vein and that in rare instances, an allergic reaction can result and may even life threatening   The patient denies any allergies to contrast, iodine and/or seafood and is not taking metformin. Following the imaging study,  I've recommended a cystoscopy. I described how this is performed, typically in an office setting with a flexible cystoscope. We described the risks, benefits, and possible side effects, the most common of which is a minor amount of blood in the urine and/or burning which usually resolves in 24 to 48 hours.   The patient had the opportunity to ask questions which were answered. Based upon this discussion, the patient is willing to proceed. Therefore, I've  ordered: a CT Urogram and cystoscopy. The patient will return following all of the above for discussion of the results.  UA Urine culture  Bladder Scan (Post Void Residual) in office  2. Feeling of incomplete bladder emptying PVR's minimal  UA sent for culture CTU and cysto are pending Follow up based on hematuria work up findings   Return for CT Urogram report and cystoscopy.  These notes generated with voice recognition software. I apologize for typographical errors.  Zara Council, PA-C  [facility] [address]  [place], [place] [address] [phone]

## 2017-12-14 ENCOUNTER — Encounter: Payer: Self-pay | Admitting: Obstetrics & Gynecology

## 2017-12-14 ENCOUNTER — Other Ambulatory Visit: Payer: Medicare HMO

## 2017-12-14 ENCOUNTER — Ambulatory Visit (INDEPENDENT_AMBULATORY_CARE_PROVIDER_SITE_OTHER): Payer: Medicare HMO | Admitting: Obstetrics & Gynecology

## 2017-12-14 VITALS — BP 130/90 | Ht 66.0 in | Wt 161.0 lb

## 2017-12-14 DIAGNOSIS — N814 Uterovaginal prolapse, unspecified: Secondary | ICD-10-CM

## 2017-12-14 DIAGNOSIS — N8111 Cystocele, midline: Secondary | ICD-10-CM

## 2017-12-14 DIAGNOSIS — Z124 Encounter for screening for malignant neoplasm of cervix: Secondary | ICD-10-CM | POA: Diagnosis not present

## 2017-12-14 DIAGNOSIS — R3129 Other microscopic hematuria: Secondary | ICD-10-CM | POA: Diagnosis not present

## 2017-12-14 LAB — URINALYSIS, COMPLETE
Bilirubin, UA: NEGATIVE
Glucose, UA: NEGATIVE
Ketones, UA: NEGATIVE
Leukocytes, UA: NEGATIVE
Nitrite, UA: NEGATIVE
Protein, UA: NEGATIVE
Specific Gravity, UA: 1.02 (ref 1.005–1.030)
Urobilinogen, Ur: 0.2 mg/dL (ref 0.2–1.0)
pH, UA: 7 (ref 5.0–7.5)

## 2017-12-14 LAB — MICROSCOPIC EXAMINATION

## 2017-12-14 NOTE — Progress Notes (Signed)
Consultant: Park Liter, DO Consulting Reason: Urinary retention and pelvic prolapse  CC: urinary retention Patient complains of recent concerns w urinary retention, this has been off and on, and she has seen urology for this as well; took a medicine and it helped.  She has concerns for a cystocele. Problem started several months ago. Symptoms include: she has had occas GSI w LOU w activity but not so much over the past several mos.  No urgency nocturia or seldom has frequency.  Takes HRT for 20 years, no hot flashes unless tries to come off of Prempro.  Last PAP 3 years ago.   PMHx: She  has a past medical history of Arthritis, CAD (coronary artery disease), Depression, Depression, Hypothyroidism, Insomnia, Menopause, Osteoporosis, and Tremors of nervous system. Also,  has a past surgical history that includes varicose veins (1998) and Breast biopsy (Left, 04/20/2016)., family history includes Alzheimer's disease in her maternal grandfather; Anxiety disorder in her daughter; Breast cancer in her maternal aunt; Breast cancer (age of onset: 6) in her maternal grandmother; Cancer in her father; Heart disease in her paternal grandfather and paternal grandmother; Tuberculosis in her father; Ulcerative colitis in her sister.,  reports that she has been smoking cigarettes. She has a 45.00 pack-year smoking history. She has never used smokeless tobacco. She reports that she does not drink alcohol or use drugs.  She has a current medication list which includes the following prescription(s): alfuzosin, aspirin, calcium carbonate-vitamin d, cholecalciferol, cyclobenzaprine, duloxetine, estrogen (conjugated)-medroxyprogesterone, hydroxyzine, ibuprofen, levothyroxine, lidocaine, magnesium, magnesium, metoprolol succinate, potassium, pregabalin, vitamin b-12, and zolpidem. Also, is allergic to erythromycin; gabapentin; tamiflu [oseltamivir phosphate]; tramadol; and nitrofurantoin.  Review of Systems    Constitutional: Negative for chills, fever and malaise/fatigue.  HENT: Negative for congestion, sinus pain and sore throat.   Eyes: Negative for blurred vision and pain.  Respiratory: Negative for cough and wheezing.   Cardiovascular: Negative for chest pain and leg swelling.  Gastrointestinal: Negative for abdominal pain, constipation, diarrhea, heartburn, nausea and vomiting.  Genitourinary: Negative for dysuria, frequency, hematuria and urgency.  Musculoskeletal: Negative for back pain, joint pain, myalgias and neck pain.  Skin: Negative for itching and rash.  Neurological: Negative for dizziness, tremors and weakness.  Endo/Heme/Allergies: Does not bruise/bleed easily.  Psychiatric/Behavioral: Negative for depression. The patient is not nervous/anxious and does not have insomnia.     Objective: BP 130/90   Ht 5\' 6"  (1.676 m)   Wt 161 lb (73 kg)   LMP  (LMP Unknown)   BMI 25.99 kg/m  Physical Exam  Constitutional: She is oriented to person, place, and time. She appears well-developed and well-nourished. No distress.  Genitourinary: Rectum normal, vagina normal and uterus normal. Pelvic exam was performed with patient supine. There is no rash or lesion on the right labia. There is no rash or lesion on the left labia. Vagina exhibits no lesion. No bleeding in the vagina. Right adnexum does not display mass and does not display tenderness. Left adnexum does not display mass and does not display tenderness. Cervix does not exhibit motion tenderness, lesion, friability or polyp.   Uterus is mobile and midaxial. Uterus is not enlarged or exhibiting a mass.  Genitourinary Comments: Small uterus with Gr 1 prolapse Gr 2 cystocele Min to no rectocele No atrophy  HENT:  Head: Normocephalic and atraumatic. Head is without laceration.  Right Ear: Hearing normal.  Left Ear: Hearing normal.  Nose: No epistaxis.  No foreign bodies.  Mouth/Throat: Uvula is midline, oropharynx is clear  and  moist and mucous membranes are normal.  Eyes: Pupils are equal, round, and reactive to light.  Neck: Normal range of motion. Neck supple. No thyromegaly present.  Cardiovascular: Normal rate and regular rhythm. Exam reveals no gallop and no friction rub.  No murmur heard. Pulmonary/Chest: Effort normal and breath sounds normal. No respiratory distress. She has no wheezes. Right breast exhibits no mass, no skin change and no tenderness. Left breast exhibits no mass, no skin change and no tenderness.  Abdominal: Soft. Bowel sounds are normal. She exhibits no distension. There is no tenderness. There is no rebound.  Musculoskeletal: Normal range of motion.  Neurological: She is alert and oriented to person, place, and time. No cranial nerve deficit.  Skin: Skin is warm and dry.  Psychiatric: She has a normal mood and affect. Judgment normal.  Vitals reviewed.  ASSESSMENT/PLAN:    Visit Diagnoses    Uterine prolapse    -  New problem   Cystocele, midline    - also new problem    Plan is to monitor for s/sx worsening prolapse as far as vaginal pressure is concerned or stress urinary incontinence.  Not a factor for urinary retention based on exam today (not severe enough to kink or compress urethra or bladder).   PAP done today  Consider taper off HRT in future. Risks of HRT discussed w pt today  Barnett Applebaum, MD, [facility], [facility] 12/14/2017  12:14 PM

## 2017-12-15 ENCOUNTER — Other Ambulatory Visit (HOSPITAL_COMMUNITY)
Admission: RE | Admit: 2017-12-15 | Discharge: 2017-12-15 | Disposition: A | Discharge status: Home / Self Care | Payer: Medicare HMO | Source: Ambulatory Visit | Attending: Obstetrics & Gynecology | Admitting: Obstetrics & Gynecology

## 2017-12-15 ENCOUNTER — Telehealth: Payer: Self-pay

## 2017-12-15 DIAGNOSIS — Z124 Encounter for screening for malignant neoplasm of cervix: Secondary | ICD-10-CM | POA: Insufficient documentation

## 2017-12-15 NOTE — Addendum Note (Signed)
Addended by: Gae Dry on: 12/15/2017 09:49 AM   Modules accepted: Orders

## 2017-12-15 NOTE — Telephone Encounter (Signed)
Can you put in pap orders for this pt Please

## 2017-12-17 LAB — CULTURE, URINE COMPREHENSIVE

## 2017-12-19 ENCOUNTER — Other Ambulatory Visit: Payer: Self-pay | Admitting: Family Medicine

## 2017-12-19 MED ORDER — HYDROXYZINE HCL 25 MG PO TABS: 25.0000 mg | ORAL_TABLET | Freq: Every day | ORAL | 2 refills | Status: DC

## 2017-12-19 NOTE — Telephone Encounter (Signed)
Requested medication (s) are due for refill today: yes  Requested medication (s) are on the active medication list: yes  Last refill:  11/30/17 #90  Future visit scheduled: no  Notes to clinic:  LOV on 11/29/17    Requested Prescriptions  Pending Prescriptions Disp Refills   cyclobenzaprine (FLEXERIL) 10 MG tablet 90 tablet 0    Sig: TAKE ONE TABLET BY MOUTH THREE TIMES A DAY FOR SPASMS     Not Delegated - Analgesics:  Muscle Relaxants Failed - 12/19/2017 11:27 AM      Failed - This refill cannot be delegated      Passed - Valid encounter within last 6 months    Recent Outpatient Visits          2 weeks ago Hypothyroidism, unspecified type   [place], Megan P, DO   6 months ago Initial Medicare annual wellness visit   [place] Kathrine Haddock, NP   1 year ago [place] Kathrine Haddock, NP   2 years ago Acute bacterial conjunctivitis of both eyes   [facility] Volney American, [place]   2 years ago Annual physical exam   [facility] Kathrine Haddock, NP           Signed Prescriptions Disp Refills   hydrOXYzine (ATARAX/VISTARIL) 25 MG tablet 30 tablet 2    Sig: Take 1 tablet (25 mg total) by mouth daily.     Ear, Nose, and Throat:  Antihistamines Passed - 12/19/2017 11:27 AM      Passed - Valid encounter within last 12 months    Recent Outpatient Visits          2 weeks ago Hypothyroidism, unspecified type   [place], Megan P, DO   6 months ago Initial Medicare annual wellness visit   [place] Kathrine Haddock, NP   1 year ago [place], NP   2 years ago Acute bacterial conjunctivitis of both eyes   [facility]ney American, [place]   2 years ago Annual physical exam   [facility] Kathrine Haddock, NP

## 2017-12-19 NOTE — Telephone Encounter (Signed)
Copied from [place] [phone]. Topic: Quick Communication - Rx Refill/Question >> Dec 19, 2017 10:12 AM Alfredia Ferguson R wrote: Medication: hydrOXYzine (ATARAX/VISTARIL) 25 MG tablet , cyclobenzaprine (FLEXERIL) 10 MG tablet ,   Has the patient contacted their pharmacy?Yes  Preferred Pharmacy (with phone number or street name): [place], [place] [phone] (Phone) [phone] (Fax)    Agent: Please be advised that RX refills may take up to 3 business days. We ask that you follow-up with your pharmacy.

## 2017-12-21 LAB — CYTOLOGY - PAP
Diagnosis: NEGATIVE
HPV: NOT DETECTED

## 2018-01-03 ENCOUNTER — Ambulatory Visit
Admission: RE | Admit: 2018-01-03 | Discharge: 2018-01-03 | Disposition: A | Discharge status: Home / Self Care | Payer: Medicare HMO | Source: Ambulatory Visit | Attending: Urology | Admitting: Urology

## 2018-01-03 DIAGNOSIS — R3129 Other microscopic hematuria: Secondary | ICD-10-CM

## 2018-01-03 DIAGNOSIS — R3914 Feeling of incomplete bladder emptying: Secondary | ICD-10-CM | POA: Diagnosis not present

## 2018-01-03 MED ORDER — IOPAMIDOL (ISOVUE-370) INJECTION 76%
125.0000 mL | Freq: Once | INTRAVENOUS | Status: AC | PRN
  Administered 2018-01-03: 125 mL via INTRAVENOUS

## 2018-01-09 ENCOUNTER — Telehealth: Payer: Self-pay | Admitting: Family Medicine

## 2018-01-09 NOTE — Telephone Encounter (Signed)
Years supply sent in in September. Should not be due

## 2018-01-09 NOTE — Telephone Encounter (Signed)
Called and left patient a VM letting her know that she should have 3 refills on this medication. Let patient know that according to documentation, next refill was set to be sent out on 12/31/17. Asked patient to call the company to check the status of this refill. Will call patient if we receive the medication.

## 2018-01-09 NOTE — Telephone Encounter (Signed)
Copied from [place] [id number]. Topic: Quick Communication - Rx Refill/Question >> Jan 09, 2018 10:26 AM Yvette Rack wrote: Pt stated she has to call the office to request the refill to be sent directly to Manufacturer: [employer]. Pt states she then picks the medication up from the office.   Medication: estrogen, conjugated,-medroxyprogesterone (PREMPRO) 0.625-2.5 MG tablet  Preferred Pharmacy (with phone number or street name): Per patient, she picks the medication up from the office  Agent: Please be advised that RX refills may take up to 3 business days. We ask that you follow-up with your pharmacy.

## 2018-01-10 NOTE — Telephone Encounter (Signed)
Pt has enough med for 36 days and will pick up  at office after 02-16-2018. Pt will call [employer]

## 2018-01-13 ENCOUNTER — Ambulatory Visit (INDEPENDENT_AMBULATORY_CARE_PROVIDER_SITE_OTHER): Payer: Medicare HMO | Admitting: Urology

## 2018-01-13 DIAGNOSIS — R399 Unspecified symptoms and signs involving the genitourinary system: Secondary | ICD-10-CM

## 2018-01-13 DIAGNOSIS — R3129 Other microscopic hematuria: Secondary | ICD-10-CM | POA: Diagnosis not present

## 2018-01-13 MED ORDER — LIDOCAINE HCL URETHRAL/MUCOSAL 2 % EX GEL
1.0000 "application " | Freq: Once | CUTANEOUS | Status: AC
  Administered 2018-01-13: 1 via URETHRAL

## 2018-01-13 NOTE — Progress Notes (Signed)
   01/13/2018  CC:  Chief Complaint  Patient presents with  . Cysto    HPI: Diane Pearson is a 62 yo F with a personal history of microscopic hematuria and bladder voiding issues returns today for a cystoscopy and CTU report. Her last visit with [place] was on 12/13/2017 with Zara Council.   She reports of the same urinary symptoms as last visit such as difficulties with starting a stream and hesitancy. She never started the alfuzosin. Pt reports a hx of fibromyalgia.  NED. A&Ox3.   No respiratory distress     Cystoscopy Procedure Note  Patient identification was confirmed, informed consent was obtained, and patient was prepped using Betadine solution.  Lidocaine jelly was administered per urethral meatus.    Procedure: - Flexible cystoscope introduced, without any difficulty.   - Thorough search of the bladder revealed:    normal urethral meatus    normal urothelium    no stones    no ulcers     no tumors    no urethral polyps    no trabeculation  - Ureteral orifices were normal in position and appearance.  Post-Procedure: - Patient tolerated the procedure well  Assessment/ Plan: 1. Microscopic Hematuria -CT Urogram and cystoscopy negative   2. LUTS -No evidence of obstruction on cysto -Discussed pelvic floor PT, pt was interested  -F/u with Larene Beach in 6 months    Return in about 6 months (around 07/15/2018).  Abbie Sons, MD  I, Lucas Mallow, am acting as a scribe for Dr. Nicki Reaper C. Damaria Stofko,  I, Abbie Sons, MD, have reviewed all documentation for this visit. The documentation on 01/15/18 for the exam, diagnosis, procedures, and orders are all accurate and complete.

## 2018-01-15 ENCOUNTER — Encounter: Payer: Self-pay | Admitting: Urology

## 2018-01-15 DIAGNOSIS — R399 Unspecified symptoms and signs involving the genitourinary system: Secondary | ICD-10-CM | POA: Insufficient documentation

## 2018-01-15 DIAGNOSIS — R3129 Other microscopic hematuria: Secondary | ICD-10-CM | POA: Insufficient documentation

## 2018-01-26 NOTE — Telephone Encounter (Signed)
Patient calling to inquire if medication is at office yet. Please advise.

## 2018-01-27 NOTE — Telephone Encounter (Signed)
[employer] back. Order was placed today. Order ID is [id number] and should arrive in 7 to 10 business days. Will call patient and let her know.

## 2018-01-27 NOTE — Telephone Encounter (Signed)
Called [employer] to check on refill status of patient's medication. The tech I was speaking with said that the patient had one refill left on the prescription. Was in the middle of processing the refill and call was disconnected. Will call to make sure order was processed.

## 2018-01-27 NOTE — Telephone Encounter (Signed)
Called and left patient a VM letting her know that her order was placed today and would arrive in 7 to 10 business days.

## 2018-01-27 NOTE — Telephone Encounter (Signed)
Pt states that she would like a call back.

## 2018-02-09 ENCOUNTER — Other Ambulatory Visit: Payer: Self-pay | Admitting: Family Medicine

## 2018-02-16 ENCOUNTER — Telehealth: Payer: Self-pay | Admitting: Family Medicine

## 2018-02-16 NOTE — Telephone Encounter (Signed)
Patient is requesting her flexoril and she also requested ibuprofen through [facility] mail delivery   Thank you  Fax # [phone]

## 2018-02-22 ENCOUNTER — Other Ambulatory Visit: Payer: Self-pay | Admitting: Family Medicine

## 2018-02-22 MED ORDER — HYDROXYZINE HCL 25 MG PO TABS: 25.0000 mg | ORAL_TABLET | Freq: Every day | ORAL | 2 refills | Status: DC

## 2018-02-22 NOTE — Telephone Encounter (Signed)
Copied from [place] [id number]. Topic: Quick Communication - Rx Refill/Question >> Feb 22, 2018  4:07 PM Selinda Flavin B, NT wrote: Medication: hydrOXYzine (ATARAX/VISTARIL) 25 MG tablet  Has the patient contacted their pharmacy? Yes.  They would send a request. Unable to see that in the chart, unsure if received yet or not. (Agent: If no, request that the patient contact the pharmacy for the refill.) (Agent: If yes, when and what did the pharmacy advise?)  Preferred Pharmacy (with phone number or street name): [place], [place] [address]  Agent: Please be advised that RX refills may take up to 3 business days. We ask that you follow-up with your pharmacy.

## 2018-03-06 ENCOUNTER — Other Ambulatory Visit: Payer: Self-pay | Admitting: Family Medicine

## 2018-03-06 NOTE — Telephone Encounter (Signed)
Copied from [place]. Topic: Quick Communication - Rx Refill/Question >> Mar 06, 2018  2:53 PM [place], [place] D wrote: Medication: cyclobenzaprine (FLEXERIL) 10 MG tablet  / Pt stated she will be out of medication on Thursday. Would like refill sent to local pharmacy since Mail Order will take 7-10 days. Please advise  Has the patient contacted their pharmacy? Yes  (Agent: If no, request that the patient contact the pharmacy for the refill.) (Agent: If yes, when and what did the pharmacy advise?)  Preferred Pharmacy (with phone number or street name): [place], [place] [phone] (Phone) [phone] (Fax)  Agent: Please be advised that RX refills may take up to 3 business days. We ask that you follow-up with your pharmacy.

## 2018-03-07 MED ORDER — CYCLOBENZAPRINE HCL 10 MG PO TABS: ORAL_TABLET | ORAL | 0 refills | Status: DC

## 2018-03-22 ENCOUNTER — Other Ambulatory Visit: Payer: Self-pay | Admitting: Family Medicine

## 2018-03-22 NOTE — Telephone Encounter (Signed)
Requested medication (s) are due for refill today: Yes  Requested medication (s) are on the active medication list: Yes  Last refill:  11/29/17  Future visit scheduled: No  Notes to clinic:  See pt. Request to reach out to [employer].    Requested Prescriptions  Pending Prescriptions Disp Refills   zolpidem (AMBIEN) 10 MG tablet 30 tablet 5    Sig: Take 1 tablet (10 mg total) by mouth at bedtime.     Not Delegated - Psychiatry:  Anxiolytics/Hypnotics Failed - 03/22/2018 11:21 AM      Failed - This refill cannot be delegated      Failed - Urine Drug Screen completed in last 360 days.      Passed - Valid encounter within last 6 months    Recent Outpatient Visits          3 months ago Hypothyroidism, unspecified type   [place], Megan P, DO   9 months ago Initial Medicare annual wellness visit   [facility] Kathrine Haddock, NP   1 year ago [place], NP   2 years ago Acute bacterial conjunctivitis of both eyes   [facility] Volney American, [place]   2 years ago Annual physical exam   [facility] Kathrine Haddock, NP      Future Appointments            In 3 months [place], Gordan Payment [facility]

## 2018-03-22 NOTE — Telephone Encounter (Signed)
Patient would like medication sent to [facility], [facility]

## 2018-03-22 NOTE — Telephone Encounter (Signed)
Left message on machine for pt to return call to the office.  

## 2018-03-22 NOTE — Telephone Encounter (Signed)
She can reach out to the program and fill out the paperwork. I can give her a written Rx to include with that- but I can't reach out to them.

## 2018-03-22 NOTE — Telephone Encounter (Signed)
Copied from [place] [id number]. Topic: Quick Communication - Rx Refill/Question >> Mar 22, 2018 11:13 AM Virl Axe D wrote: Medication: zolpidem (AMBIEN) 10 MG tablet / Pt called and stated she no longer has insurance but will regain it on 04/09/18. Pt would like to have Dr. Wynetta Emery reach out to [employer] [place] [facility] and submit request to them to cover pt's rx. Please advise or reach out to pt for more information.  Has the patient contacted their pharmacy? Yes.   (Agent: If no, request that the patient contact the pharmacy for the refill.) (Agent: If yes, when and what did the pharmacy advise?)  Preferred Pharmacy (with phone number or street name): [facility], [place] [phone] (Phone) [phone] (Fax)  Agent: Please be advised that RX refills may take up to 3 business days. We ask that you follow-up with your pharmacy.

## 2018-03-23 ENCOUNTER — Other Ambulatory Visit: Payer: Self-pay | Admitting: Family Medicine

## 2018-03-23 MED ORDER — ZOLPIDEM TARTRATE 10 MG PO TABS: 10.0000 mg | ORAL_TABLET | Freq: Every day | ORAL | 1 refills | Status: DC

## 2018-03-23 NOTE — Telephone Encounter (Signed)
6 month supply sent to [facility] in October. If she's getting it from there, she should not need a refill.

## 2018-03-23 NOTE — Telephone Encounter (Signed)
Called patient. No answer. Left detailed  VM message (DPR reviewed) ans asked pt to return call with any questions about the message.

## 2018-03-23 NOTE — Telephone Encounter (Signed)
Called and spoke with the pharmacy, last script was received 10/31/17 with no refills and filled 11/01/17.

## 2018-03-28 ENCOUNTER — Other Ambulatory Visit: Payer: Self-pay | Admitting: Family Medicine

## 2018-03-28 DIAGNOSIS — Z1231 Encounter for screening mammogram for malignant neoplasm of breast: Secondary | ICD-10-CM

## 2018-04-10 ENCOUNTER — Other Ambulatory Visit: Payer: Self-pay | Admitting: Family Medicine

## 2018-04-10 NOTE — Telephone Encounter (Signed)
Copied from [place] [id number]. Topic: Quick Communication - Rx Refill/Question >> Apr 10, 2018  4:49 PM Sheran Luz wrote: Medication: estrogen, conjugated,-medroxyprogesterone (PREMPRO) 0.625-2.5 MG tablet  Patient is requesting refill of this medication   Preferred Pharmacy (with phone number or street name):[facility], [place] [phone] (Phone) [phone] (Fax)

## 2018-04-11 ENCOUNTER — Ambulatory Visit (INDEPENDENT_AMBULATORY_CARE_PROVIDER_SITE_OTHER): Payer: Medicare Other | Admitting: Podiatry

## 2018-04-11 ENCOUNTER — Encounter: Payer: Self-pay | Admitting: Podiatry

## 2018-04-11 DIAGNOSIS — L600 Ingrowing nail: Secondary | ICD-10-CM

## 2018-04-11 DIAGNOSIS — B351 Tinea unguium: Secondary | ICD-10-CM | POA: Diagnosis not present

## 2018-04-11 MED ORDER — CONJ ESTROG-MEDROXYPROGEST ACE 0.625-2.5 MG PO TABS: 1.0000 | ORAL_TABLET | Freq: Every day | ORAL | 3 refills | Status: DC

## 2018-04-11 MED ORDER — GENTAMICIN SULFATE 0.1 % EX CREA: 1.0000 "application " | TOPICAL_CREAM | Freq: Two times a day (BID) | CUTANEOUS | 1 refills | Status: DC

## 2018-04-11 MED ORDER — TERBINAFINE HCL 250 MG PO TABS: 250.0000 mg | ORAL_TABLET | Freq: Every day | ORAL | 0 refills | Status: DC

## 2018-04-11 NOTE — Telephone Encounter (Signed)
Requested Prescriptions  Pending Prescriptions Disp Refills  . estrogen, conjugated,-medroxyprogesterone (PREMPRO) 0.625-2.5 MG tablet 84 tablet 3    Sig: Take 1 tablet by mouth daily.     OB/GYN:  Hormone Combinations Passed - 04/10/2018  5:31 PM      Passed - Mammogram is up-to-date per Health Maintenance      Passed - Last BP in normal range    BP Readings from Last 1 Encounters:  01/13/18 138/85         Passed - Valid encounter within last 12 months    Recent Outpatient Visits          4 months ago Hypothyroidism, unspecified type   [place], Megan P, DO   10 months ago Initial Medicare annual wellness visit   [facility] Kathrine Haddock, NP   1 year ago [place], NP   2 years ago Acute bacterial conjunctivitis of both eyes   [facility] Volney American, [place]   2 years ago Annual physical exam   [facility] Kathrine Haddock, NP      Future Appointments            In 3 months McG[place]an, Gordan Payment [facility]

## 2018-04-17 ENCOUNTER — Telehealth: Payer: Self-pay

## 2018-04-17 NOTE — Telephone Encounter (Signed)
-----   Message from Arlyce Dice sent at 04/14/2018  9:54 AM EST ----- Patient called in stating that current medication "Lamisil" has made her dizzy, nauseated, and falling episodes. She has currently stopped taking the medication, wants someone to call her back about possibly trying a different medication.   Thanks :)

## 2018-04-17 NOTE — Telephone Encounter (Signed)
Dr. Amalia Hailey, is there anything else you want her to try?  Please advise

## 2018-04-19 NOTE — Telephone Encounter (Signed)
No. Discontinue until f/u appt. We can discuss different options then.

## 2018-05-02 ENCOUNTER — Encounter: Payer: Self-pay | Admitting: Podiatry

## 2018-05-02 ENCOUNTER — Other Ambulatory Visit: Payer: Self-pay

## 2018-05-02 ENCOUNTER — Ambulatory Visit (INDEPENDENT_AMBULATORY_CARE_PROVIDER_SITE_OTHER): Payer: Medicare Other | Admitting: Podiatry

## 2018-05-02 DIAGNOSIS — B351 Tinea unguium: Secondary | ICD-10-CM

## 2018-05-02 DIAGNOSIS — L600 Ingrowing nail: Secondary | ICD-10-CM

## 2018-05-02 NOTE — Progress Notes (Addendum)
   Subjective: Patient presents today for evaluation of pain to the bilateral great toes. Patient is concerned for possible ingrown nail. Patient presents today for further treatment and evaluation.  Past Medical History:  Diagnosis Date  . Arthritis   . CAD (coronary artery disease)    Diagnosed on CT scan  . Depression   . Depression   . Hypothyroidism   . Insomnia   . Menopause   . Osteoporosis   . Tremors of nervous system     Objective:  General: Well developed, nourished, in no acute distress, alert and oriented x3   Dermatology: Skin is warm, dry and supple bilateral. Bilateral great toenails appears to be erythematous with evidence of an ingrowing nail. Pain on palpation noted to the border of the nail fold. The remaining nails appear unremarkable at this time. There are no open sores, lesions.  Vascular: Dorsalis Pedis artery and Posterior Tibial artery pedal pulses palpable. No lower extremity edema noted.   Neruologic: Grossly intact via light touch bilateral.  Musculoskeletal: Muscular strength within normal limits in all groups bilateral. Normal range of motion noted to all pedal and ankle joints.   Assesement: #1 Paronychia with ingrowing nail bilateral great toes #2 Pain in toe #3 Incurvated nail  Plan of Care:  1. Patient evaluated.  2. Discussed treatment alternatives and plan of care. Explained nail avulsion procedure and post procedure course to patient. 3. Patient opted for permanent partial nail avulsion.  4. Prior to procedure, local anesthesia infiltration utilized using 3 ml of a 50:50 mixture of 2% plain lidocaine and 0.5% plain marcaine in a normal hallux block fashion and a betadine prep performed.  5. Partial permanent nail avulsion with chemical matrixectomy performed using [id number] applications of phenol followed by alcohol flush.  6. Light dressing applied. 7. Return to clinic in 2 weeks.   Edrick Kins, DPM [facility]  Dr.  Edrick Kins, [address]                                        [place], [place] [address]                Office [phone]  Fax [phone]

## 2018-05-03 NOTE — Progress Notes (Signed)
   Subjective: 64 y.o. female presents today status post permanent nail avulsion procedure of the lateral border of the bilateral of the bilateral great toes that was performed on 04/11/2018. She is also here for follow up evaluation of fungal nails. She states she is doing well. She denies any pain or modifying factors. She has been taking Lamisil as directed. Patient is here for further evaluation and treatment.     Past Medical History:  Diagnosis Date  . Arthritis   . CAD (coronary artery disease)    Diagnosed on CT scan  . Depression   . Depression   . Hypothyroidism   . Insomnia   . Menopause   . Osteoporosis   . Tremors of nervous system     Objective: Skin is warm, dry and supple. Nail and respective nail fold appears to be healing appropriately. Open wound to the associated nail fold with a granular wound base and moderate amount of fibrotic tissue. Minimal drainage noted. Mild erythema around the periungual region likely due to phenol chemical matricectomy.  Assessment: #1 postop permanent partial nail avulsion lateral border bilateral great toes #2 open wound periungual nail fold of respective digit.   Plan of care: #1 patient was evaluated  #2 debridement of open wound was performed to the periungual border of the respective toe using a currette. Antibiotic ointment and Band-Aid was applied. #3 Continue taking Lamisil 250 mg daily.  #4 patient is to return to clinic on a PRN basis.   Edrick Kins, DPM [facility]  Dr. Edrick Kins, [place]                                        [place], [place] [address]                Office [phone]  Fax [phone]

## 2018-06-01 ENCOUNTER — Encounter: Payer: Self-pay | Admitting: *Deleted

## 2018-06-10 ENCOUNTER — Other Ambulatory Visit: Payer: Self-pay | Admitting: Unknown Physician Specialty

## 2018-06-13 ENCOUNTER — Other Ambulatory Visit: Payer: Self-pay

## 2018-06-13 ENCOUNTER — Telehealth (INDEPENDENT_AMBULATORY_CARE_PROVIDER_SITE_OTHER): Payer: Medicare Other | Admitting: Family Medicine

## 2018-06-13 ENCOUNTER — Encounter: Payer: Self-pay | Admitting: Family Medicine

## 2018-06-13 VITALS — BP 143/86 | HR 77 | Temp 97.7°F

## 2018-06-13 DIAGNOSIS — F5101 Primary insomnia: Secondary | ICD-10-CM

## 2018-06-13 DIAGNOSIS — E782 Mixed hyperlipidemia: Secondary | ICD-10-CM

## 2018-06-13 DIAGNOSIS — E039 Hypothyroidism, unspecified: Secondary | ICD-10-CM

## 2018-06-13 DIAGNOSIS — F332 Major depressive disorder, recurrent severe without psychotic features: Secondary | ICD-10-CM | POA: Diagnosis not present

## 2018-06-13 DIAGNOSIS — M797 Fibromyalgia: Secondary | ICD-10-CM

## 2018-06-13 DIAGNOSIS — R3129 Other microscopic hematuria: Secondary | ICD-10-CM

## 2018-06-13 MED ORDER — PREGABALIN 100 MG PO CAPS: 100.0000 mg | ORAL_CAPSULE | Freq: Two times a day (BID) | ORAL | 1 refills | Status: DC

## 2018-06-13 MED ORDER — ZOLPIDEM TARTRATE 10 MG PO TABS: 10.0000 mg | ORAL_TABLET | Freq: Every day | ORAL | 5 refills | Status: DC

## 2018-06-13 MED ORDER — DULOXETINE HCL 60 MG PO CPEP: 60.0000 mg | ORAL_CAPSULE | Freq: Every day | ORAL | 1 refills | Status: DC

## 2018-06-13 MED ORDER — METOPROLOL SUCCINATE ER 50 MG PO TB24: 50.0000 mg | ORAL_TABLET | Freq: Every day | ORAL | 1 refills | Status: DC

## 2018-06-13 MED ORDER — HYDROXYZINE HCL 25 MG PO TABS: 25.0000 mg | ORAL_TABLET | Freq: Every day | ORAL | 2 refills | Status: DC

## 2018-06-13 NOTE — Progress Notes (Signed)
Temp 97.7 F (36.5 C) (Oral)   LMP  (LMP Unknown)    Subjective:    Patient ID: Diane Pearson, female    DOB: Aug 20, 1955, 63 y.o.   MRN: [id number]  HPI: Diane Pearson is a 63 y.o. female  Chief Complaint  Patient presents with  . Hypothyroidism  . Fibromyalgia  . Depression  . Insomnia   HYPOTHYROIDISM Thyroid control status:controlled Satisfied with current treatment? yes Medication side effects: no Medication compliance: excellent compliance Recent dose adjustment:no Fatigue: no Cold intolerance: no Heat intolerance: no Weight gain: no Weight loss: no Constipation: no Diarrhea/loose stools: no Palpitations: no Lower extremity edema: no Anxiety/depressed mood: no  Paresthesias: yes  DEPRESSION Mood status: controlled Satisfied with current treatment?: yes Symptom severity: mild  Duration of current treatment : chronic Side effects: no Medication compliance: excellent compliance Psychotherapy/counseling: no  Previous psychiatric medications: cymbalta Depressed mood: no Anxious mood: no Anhedonia: no Significant weight loss or gain: no Insomnia: yes  Fatigue: yes Feelings of worthlessness or guilt: no Impaired concentration/indecisiveness: no Suicidal ideations: no Hopelessness: no Crying spells: no Depression screen [facility] 2/9 06/13/2018 11/29/2017 05/30/2017 04/16/2016 09/29/2015  Decreased Interest 0 1 0 3 0  Down, Depressed, Hopeless 0 1 0 3 0  PHQ - 2 Score 0 2 0 6 0  Altered sleeping 2 2 3 2  -  Tired, decreased energy 2 2 3 3  -  Change in appetite 0 0 0 3 -  Feeling bad or failure about yourself  0 0 0 0 -  Trouble concentrating 1 3 3 2  -  Moving slowly or fidgety/restless 0 3 2 3  -  Suicidal thoughts 0 0 0 0 -  PHQ-9 Score 5 12 11 19  -  Difficult doing work/chores Not difficult at all - Very difficult - -   INSOMNIA Duration: chronic Satisfied with sleep quality: yes Difficulty falling asleep: no Difficulty staying asleep: no Waking a few  hours after sleep onset: no Early morning awakenings: no Daytime hypersomnolence: yes Wakes feeling refreshed: no Good sleep hygiene: no Apnea: no Snoring: no Depressed/anxious mood: yes Recent stress: yes Restless legs/nocturnal leg cramps: no Chronic pain/arthritis: yes Treatments attempted: ambien   FIBROMYALGIA Pain status: fluctuating Satisfied with current treatment?: yes Medication side effects: no Medication compliance: good compliance Duration: chronic Location: widespread Quality: aching Current pain level: moderate Previous pain level: moderate Previous pain specialty evaluation: no Non-narcotic analgesic meds: yes Narcotic contract:no Treatments attempted: rest, ice, heat, APAP, ibuprofen and aleve   Relevant past medical, surgical, family and social history reviewed and updated as indicated. Interim medical history since our last visit reviewed. Allergies and medications reviewed and updated.  Review of Systems  Constitutional: Negative.   Respiratory: Negative.   Cardiovascular: Negative.   Gastrointestinal: Negative.   Endocrine: Negative.   Musculoskeletal: Positive for arthralgias and myalgias. Negative for back pain, gait problem, joint swelling, neck pain and neck stiffness.  Skin: Negative.   Neurological: Negative.   Psychiatric/Behavioral: Positive for dysphoric mood and sleep disturbance. Negative for agitation, behavioral problems, confusion, decreased concentration, hallucinations, self-injury and suicidal ideas. The patient is not nervous/anxious and is not hyperactive.     Per HPI unless specifically indicated above     Objective:    Temp 97.7 F (36.5 C) (Oral)   LMP  (LMP Unknown)   Wt Readings from Last 3 Encounters:  01/13/18 164 lb (74.4 kg)  12/14/17 161 lb (73 kg)  12/13/17 161 lb 4.8 oz (73.2 kg)  Physical Exam Vitals signs and nursing note reviewed.  Constitutional:      General: She is not in acute distress.     Appearance: Normal appearance. She is not ill-appearing, toxic-appearing or diaphoretic.  HENT:     Head: Normocephalic and atraumatic.     Right Ear: External ear normal.     Left Ear: External ear normal.     Nose: Nose normal.     Mouth/Throat:     Mouth: Mucous membranes are moist.     Pharynx: Oropharynx is clear.  Eyes:     General: No scleral icterus.       Right eye: No discharge.        Left eye: No discharge.     Conjunctiva/sclera: Conjunctivae normal.     Pupils: Pupils are equal, round, and reactive to light.  Neck:     Musculoskeletal: Normal range of motion.  Pulmonary:     Effort: Pulmonary effort is normal. No respiratory distress.     Comments: Speaking in full sentences Musculoskeletal: Normal range of motion.  Skin:    Coloration: Skin is not jaundiced or pale.     Findings: No bruising, erythema, lesion or rash.  Neurological:     Mental Status: She is alert and oriented to person, place, and time. Mental status is at baseline.  Psychiatric:        Mood and Affect: Mood normal.        Behavior: Behavior normal.        Thought Content: Thought content normal.        Judgment: Judgment normal.     Results for orders placed or performed in visit on 12/14/17  Cytology - PAP  Result Value Ref Range   Adequacy      Satisfactory for evaluation  endocervical/transformation zone component PRESENT.   Diagnosis      NEGATIVE FOR INTRAEPITHELIAL LESIONS OR MALIGNANCY.   HPV NOT DETECTED    Material Submitted CervicoVaginal Pap [ThinPrep Imaged]       Assessment & Plan:   Problem List Items Addressed This Visit      Endocrine   Hypothyroid - Primary    Will recheck labs. Await results. Call with any concerns.        Relevant Medications   metoprolol succinate (TOPROL-XL) 50 MG 24 hr tablet   Other Relevant Orders   CBC with Differential/Platelet   Comprehensive metabolic panel   TSH     Genitourinary   Microscopic hematuria    Will recheck  urine. Await results.       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 Continue current regimen. Continue to monitor. Call with any concerns. Refills given.        Relevant Medications   hydrOXYzine (ATARAX/VISTARIL) 25 MG tablet   DULoxetine (CYMBALTA) 60 MG capsule   Other Relevant Orders   CBC with Differential/Platelet   Comprehensive metabolic panel   VITAMIN D 25 Hydroxy (Vit-D Deficiency, Fractures)   Insomnia    Under good control on current regimen. Continue current regimen. Continue to monitor. Call with any concerns. Refills given.        Relevant Orders   CBC with Differential/Platelet   Comprehensive metabolic panel   VITAMIN D 25 Hydroxy (Vit-D Deficiency, Fractures)   Fibromyalgia    Under good control on current regimen. Continue current regimen. Continue to monitor.  Call with any concerns. Refills given.        Relevant Medications   pregabalin (LYRICA) 100 MG capsule   DULoxetine (CYMBALTA) 60 MG capsule   Other Relevant Orders   CBC with Differential/Platelet   Comprehensive metabolic panel   VITAMIN D 25 Hydroxy (Vit-D Deficiency, Fractures)   Hyperlipidemia    Will get labs. Await results. Call with any concerns.       Relevant Medications   metoprolol succinate (TOPROL-XL) 50 MG 24 hr tablet   Other Relevant Orders   Lipid Panel w/o Chol/HDL Ratio   CBC with Differential/Platelet   Comprehensive metabolic panel       Follow up plan: Return in about 3 months (around 09/13/2018).   . This visit was completed via mychart due to the restrictions of the COVID-19 pandemic. All issues as above were discussed and addressed. Physical exam was done as above through visual confirmation on mychart. If it was felt that the patient should be evaluated in the office, they were directed there. The patient verbally consented to this visit. . Location of the patient:  home . Location of the provider: work . Those involved with this call:  . Provider: Park Liter, DO . CMA: Merilyn Baba, CMA . Front Desk/Registration: Linard Millers  . Time spent on call: 25 minutes with patient face to face via video conference. More than 50% of this time was spent in counseling and coordination of care. 40 minutes total spent in review of patient's record and preparation of their chart.

## 2018-06-20 ENCOUNTER — Encounter: Payer: Self-pay | Admitting: Family Medicine

## 2018-06-20 DIAGNOSIS — M9901 Segmental and somatic dysfunction of cervical region: Secondary | ICD-10-CM | POA: Diagnosis not present

## 2018-06-20 DIAGNOSIS — M545 Low back pain: Secondary | ICD-10-CM | POA: Diagnosis not present

## 2018-06-20 DIAGNOSIS — M9903 Segmental and somatic dysfunction of lumbar region: Secondary | ICD-10-CM | POA: Diagnosis not present

## 2018-06-20 DIAGNOSIS — M542 Cervicalgia: Secondary | ICD-10-CM | POA: Diagnosis not present

## 2018-06-20 NOTE — Assessment & Plan Note (Signed)
Under good control on current regimen. Continue current regimen. Continue to monitor. Call with any concerns. Refills given.   

## 2018-06-20 NOTE — Assessment & Plan Note (Signed)
Will get labs. Await results. Call with any concerns.

## 2018-06-20 NOTE — Assessment & Plan Note (Signed)
Will recheck urine. Await results.

## 2018-06-20 NOTE — Assessment & Plan Note (Signed)
Will recheck labs. Await results. Call with any concerns.

## 2018-06-22 ENCOUNTER — Other Ambulatory Visit: Payer: Medicare Other

## 2018-06-22 ENCOUNTER — Other Ambulatory Visit: Payer: Self-pay

## 2018-06-22 DIAGNOSIS — F5101 Primary insomnia: Secondary | ICD-10-CM | POA: Diagnosis not present

## 2018-06-22 DIAGNOSIS — R3129 Other microscopic hematuria: Secondary | ICD-10-CM | POA: Diagnosis not present

## 2018-06-22 DIAGNOSIS — E039 Hypothyroidism, unspecified: Secondary | ICD-10-CM

## 2018-06-22 DIAGNOSIS — M797 Fibromyalgia: Secondary | ICD-10-CM

## 2018-06-22 DIAGNOSIS — E782 Mixed hyperlipidemia: Secondary | ICD-10-CM | POA: Diagnosis not present

## 2018-06-22 DIAGNOSIS — F332 Major depressive disorder, recurrent severe without psychotic features: Secondary | ICD-10-CM

## 2018-06-23 LAB — COMPREHENSIVE METABOLIC PANEL
ALT: 19 IU/L (ref 0–32)
AST: 22 IU/L (ref 0–40)
Albumin/Globulin Ratio: 1.5 (ref 1.2–2.2)
Albumin: 3.8 g/dL (ref 3.8–4.8)
Alkaline Phosphatase: 78 IU/L (ref 39–117)
BUN/Creatinine Ratio: 17 (ref 12–28)
BUN: 13 mg/dL (ref 8–27)
Bilirubin Total: 0.3 mg/dL (ref 0.0–1.2)
CO2: 23 mmol/L (ref 20–29)
Calcium: 8.8 mg/dL (ref 8.7–10.3)
Chloride: 101 mmol/L (ref 96–106)
Creatinine, Ser: 0.76 mg/dL (ref 0.57–1.00)
GFR calc Af Amer: 98 mL/min/{1.73_m2} (ref >59)
GFR calc non Af Amer: 85 mL/min/{1.73_m2} (ref >59)
Globulin, Total: 2.6 g/dL (ref 1.5–4.5)
Glucose: 102 mg/dL — ABNORMAL HIGH (ref 65–99)
Potassium: 4.5 mmol/L (ref 3.5–5.2)
Sodium: 136 mmol/L (ref 134–144)
Total Protein: 6.4 g/dL (ref 6.0–8.5)

## 2018-06-23 LAB — LIPID PANEL W/O CHOL/HDL RATIO
Cholesterol, Total: 172 mg/dL (ref 100–199)
HDL: 46 mg/dL (ref >39)
LDL Calculated: 101 mg/dL — ABNORMAL HIGH (ref 0–99)
Triglycerides: 126 mg/dL (ref 0–149)
VLDL Cholesterol Cal: 25 mg/dL (ref 5–40)

## 2018-06-23 LAB — CBC WITH DIFFERENTIAL/PLATELET
Basophils Absolute: 0.1 10*3/uL (ref 0.0–0.2)
Basos: 1 %
EOS (ABSOLUTE): 0.3 10*3/uL (ref 0.0–0.4)
Eos: 5 %
Hematocrit: 32.2 % — ABNORMAL LOW (ref 34.0–46.6)
Hemoglobin: 11.4 g/dL (ref 11.1–15.9)
Immature Grans (Abs): 0 10*3/uL (ref 0.0–0.1)
Immature Granulocytes: 0 %
Lymphocytes Absolute: 1.5 10*3/uL (ref 0.7–3.1)
Lymphs: 23 %
MCH: 31.7 pg (ref 26.6–33.0)
MCHC: 35.4 g/dL (ref 31.5–35.7)
MCV: 89 fL (ref 79–97)
Monocytes Absolute: 0.4 10*3/uL (ref 0.1–0.9)
Monocytes: 6 %
Neutrophils Absolute: 4.2 10*3/uL (ref 1.4–7.0)
Neutrophils: 65 %
Platelets: 228 10*3/uL (ref 150–450)
RBC: 3.6 x10E6/uL — ABNORMAL LOW (ref 3.77–5.28)
RDW: 12.5 % (ref 11.7–15.4)
WBC: 6.4 10*3/uL (ref 3.4–10.8)

## 2018-06-23 LAB — TSH: TSH: 2.51 u[IU]/mL (ref 0.450–4.500)

## 2018-06-23 LAB — VITAMIN D 25 HYDROXY (VIT D DEFICIENCY, FRACTURES): Vit D, 25-Hydroxy: 47.9 ng/mL (ref 30.0–100.0)

## 2018-06-24 LAB — MICROSCOPIC EXAMINATION

## 2018-06-24 LAB — UA/M W/RFLX CULTURE, ROUTINE
Bilirubin, UA: NEGATIVE
Glucose, UA: NEGATIVE
Ketones, UA: NEGATIVE
Nitrite, UA: NEGATIVE
Specific Gravity, UA: 1.015 (ref 1.005–1.030)
Urobilinogen, Ur: 0.2 mg/dL (ref 0.2–1.0)
pH, UA: 7 (ref 5.0–7.5)

## 2018-06-24 LAB — URINE CULTURE, REFLEX

## 2018-07-11 ENCOUNTER — Ambulatory Visit (HOSPITAL_COMMUNITY)
Admission: RE | Admit: 2018-07-11 | Discharge: 2018-07-11 | Disposition: A | Discharge status: Home / Self Care | Payer: Self-pay | Source: Ambulatory Visit

## 2018-07-11 ENCOUNTER — Other Ambulatory Visit: Payer: Self-pay

## 2018-07-11 ENCOUNTER — Ambulatory Visit
Admission: RE | Admit: 2018-07-11 | Discharge: 2018-07-11 | Disposition: A | Discharge status: Home / Self Care | Payer: Medicare Other | Source: Ambulatory Visit | Attending: Family Medicine | Admitting: Family Medicine

## 2018-07-11 DIAGNOSIS — Z1231 Encounter for screening mammogram for malignant neoplasm of breast: Secondary | ICD-10-CM | POA: Diagnosis not present

## 2018-07-18 NOTE — Progress Notes (Signed)
07/19/2018 2:50 PM   Diane Pearson [birth year] [id number]  Referring provider: Valerie Roys, DO [place], [place] [address]  Chief Complaint  Patient presents with  . Follow-up    HPI: Patient is a 63 year old Caucasian female with a history of hematuria and incomplete bladder emptying who presents today for a 6 month follow up.  History of hematuria Smoker.  CTU and cystoscopy in 01/2018 noted pelvic floor laxity.  UA at PCP's on 06/22/2018 was negative for hematuria.    No reports of gross hematuria.    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  The patient is  experiencing urgency x 0-3, frequency x 0-3, not restricting fluids to avoid visits to the restroom, not engaging in toilet mapping, incontinence x 0-3 and nocturia x 4-7.   Her BP is 147/90.   Her PVR is 111 mL.    Her main complaints are nocturia, intermittency and hesitancy.  Patient denies any dysuria or suprapubic/flank pain.  Patient denies any fevers, chills, nausea or vomiting.    She has started taking fiber gummy bears and has noticed an improvement in her BM.      PMH: Past Medical History:  Diagnosis Date  . Arthritis   . CAD (coronary artery disease)    Diagnosed on CT scan  . Depression   . Depression   . Hypothyroidism   . Insomnia   . Menopause   . Osteoporosis   . Tremors of nervous system     Surgical History: Past Surgical History:  Procedure Laterality Date  . BREAST BIOPSY Left 04/20/2016   COLUMNAR CELL CHANGE  . varicose veins  1998    Home Medications:  Allergies as of 07/19/2018      Reactions   Erythromycin Other (See Comments)   cramps   Gabapentin Other (See Comments)   tremors   Tamiflu [oseltamivir Phosphate] Other (See Comments)   Cramps and stomach issues   Tramadol    headaches   Nitrofurantoin Diarrhea, Nausea And Vomiting      Medication List       Accurate as of July 19, 2018  2:50 PM. If you have any questions, ask your  nurse or doctor.        STOP taking these medications   alfuzosin 10 MG 24 hr tablet Commonly known as:  UROXATRAL Stopped by:  Seung Nidiffer, PA-C   aspirin 81 MG tablet Stopped by:  Lanyla Costello, PA-C   gentamicin cream 0.1 % Commonly known as:  GARAMYCIN Stopped by:  Yang Rack, PA-C     TAKE these medications   CALCIUM 600+D PO Take 600 mg by mouth daily.   CENTRUM SILVER ADULT 50+ PO Take 1 tablet by mouth daily.   cholecalciferol 1000 units tablet Commonly known as:  VITAMIN D Take 1,000 Units by mouth daily.   cyclobenzaprine 10 MG tablet Commonly known as:  FLEXERIL TAKE ONE TABLET BY MOUTH THREE TIMES A DAY FOR SPASMS   diclofenac sodium 1 % Gel Commonly known as:  VOLTAREN Apply 2 g topically 4 (four) times daily.   DULoxetine 60 MG capsule Commonly known as:  CYMBALTA Take 1 capsule (60 mg total) by mouth daily.   estrogen (conjugated)-medroxyprogesterone 0.625-2.5 MG tablet Commonly known as:  Prempro Take 1 tablet by mouth daily.   famotidine 10 MG tablet Commonly known as:  PEPCID Take 10 mg by mouth at bedtime.   hydrOXYzine 25 MG tablet Commonly known as:  ATARAX/VISTARIL Take 1 tablet (25 mg total) by mouth daily.   ibuprofen 800 MG tablet Commonly known as:  ADVIL Take 800 mg by mouth at bedtime as needed.   levothyroxine 50 MCG tablet Commonly known as:  SYNTHROID Take 1 tablet (50 mcg total) by mouth daily before breakfast.   lidocaine 5 % ointment Commonly known as:  XYLOCAINE Apply 1 application topically 3 (three) times daily as needed.   Magnesium 400 MG Tabs Take 400 mg by mouth daily.   metoprolol succinate 50 MG 24 hr tablet Commonly known as:  TOPROL-XL Take 1 tablet (50 mg total) by mouth daily. Take with or immediately following a meal.   Potassium 99 MG Tabs Take 99 mg by mouth daily.   pregabalin 100 MG capsule Commonly known as:  LYRICA Take 1 capsule (100 mg total) by mouth 2 (two) times daily.    triamcinolone cream 0.1 % Commonly known as:  KENALOG Apply 1 application topically 2 (two) times daily.   vitamin B-12 500 MCG tablet Commonly known as:  CYANOCOBALAMIN Take 500 mcg by mouth daily.   zolpidem 10 MG tablet Commonly known as:  AMBIEN Take 1 tablet (10 mg total) by mouth at bedtime.       Allergies:  Allergies  Allergen Reactions  . Erythromycin Other (See Comments)    cramps  . Gabapentin Other (See Comments)    tremors  . Tamiflu [Oseltamivir Phosphate] Other (See Comments)    Cramps and stomach issues   . Tramadol     headaches  . Nitrofurantoin Diarrhea and Nausea And Vomiting    Family History: Family History  Problem Relation Age of Onset  . Cancer Father        Lung  . Tuberculosis Father   . Breast cancer Maternal Grandmother 12  . Ulcerative colitis Sister   . Anxiety disorder Daughter   . Alzheimer's disease Maternal Grandfather   . Heart disease Paternal Grandmother        MI  . Heart disease Paternal Grandfather        MI  . Breast cancer Maternal Aunt        70's    Social History:  reports that she has been smoking cigarettes. She has a 45.00 pack-year smoking history. She has never used smokeless tobacco. She reports that she does not drink alcohol or use drugs.  ROS: UROLOGY Frequent Urination?: No Hard to postpone urination?: No Burning/pain with urination?: No Get up at night to urinate?: Yes Leakage of urine?: No Urine stream starts and stops?: Yes Trouble starting stream?: Yes Do you have to strain to urinate?: No Blood in urine?: No Urinary tract infection?: No Sexually transmitted disease?: No Injury to kidneys or bladder?: No Painful intercourse?: No Weak stream?: No Currently pregnant?: No Vaginal bleeding?: No Last menstrual period?: Postmenopausal  Gastrointestinal Nausea?: No Vomiting?: No Indigestion/heartburn?: Yes Diarrhea?: No Constipation?: Yes  Constitutional Fever: No Night sweats?: Yes  Weight loss?: No Fatigue?: Yes  Skin Skin rash/lesions?: Yes Itching?: No  Eyes Blurred vision?: Yes Double vision?: No  Ears/Nose/Throat Sore throat?: No Sinus problems?: Yes  Hematologic/Lymphatic Swollen glands?: No Easy bruising?: No  Cardiovascular Leg swelling?: Yes Chest pain?: No  Respiratory Cough?: No Shortness of breath?: No  Endocrine Excessive thirst?: Yes  Musculoskeletal Back pain?: Yes Joint pain?: Yes  Neurological Headaches?: Yes Dizziness?: Yes  Psychologic Depression?: Yes Anxiety?: No  Physical Exam: BP (!) 147/90 (BP Location: Left Arm, Patient Position: Sitting, Cuff Size: Normal)  Pulse 77   Ht 5\' 6"  (1.676 m)   Wt 161 lb (73 kg)   LMP  (LMP Unknown)   BMI 25.99 kg/m   Constitutional:  Well nourished. Alert and oriented, No acute distress. HEENT: [place] AT, moist mucus membranes.  Trachea midline, no masses. Cardiovascular: No clubbing, cyanosis, or edema. Respiratory: Normal respiratory effort, no increased work of breathing. Neurologic: Grossly intact, no focal deficits, moving all 4 extremities. Psychiatric: Normal mood and affect.   Laboratory Data: Lab Results  Component Value Date   WBC 6.4 06/22/2018   HGB 11.4 06/22/2018   HCT 32.2 (L) 06/22/2018   MCV 89 06/22/2018   PLT 228 06/22/2018    Lab Results  Component Value Date   CREATININE 0.76 06/22/2018    No results found for: PSA  No results found for: TESTOSTERONE  No results found for: HGBA1C  Lab Results  Component Value Date   TSH 2.510 06/22/2018       Component Value Date/Time   CHOL 172 06/22/2018 0955   HDL 46 06/22/2018 0955   LDLCALC 101 (H) 06/22/2018 0955    Lab Results  Component Value Date   AST 22 06/22/2018   Lab Results  Component Value Date   ALT 19 06/22/2018   No components found for: ALKALINEPHOPHATASE No components found for: BILIRUBINTOTAL  No results found for: ESTRADIOL   I have reviewed the labs.    Pertinent Imaging: Results for JOVANKA, WESTGATE (MRN [id number]) as of 07/19/2018 14:28  Ref. Range 07/19/2018 14:23  Scan Result Unknown 11mL   Assessment & Plan:    1. History of hematuria Hematuria work up completed in 01/2018 - findings positive for pelvic floor laxity No report of gross hematuria  UA at PCP's office negative  RTC in one year for UA - patient to report any gross hematuria in the interim    2. Feeling of incomplete bladder emptying Patient feels this is due to her fibromyalgia and it is not bothersome to her at this time She is not interested in PT   3. Lung nodule Due for repeat screening chest CT - will contact PCP   Return in about 1 year (around 07/19/2019) for OAB questionnaire, PVR and exam.  These notes generated with voice recognition software. I apologize for typographical errors.  Zara Council, PA-C  [facility] [address]  [place], [place] [address] [phone]

## 2018-07-19 ENCOUNTER — Ambulatory Visit (INDEPENDENT_AMBULATORY_CARE_PROVIDER_SITE_OTHER): Payer: Medicare Other | Admitting: Urology

## 2018-07-19 ENCOUNTER — Encounter: Payer: Self-pay | Admitting: Urology

## 2018-07-19 ENCOUNTER — Other Ambulatory Visit: Payer: Self-pay

## 2018-07-19 VITALS — BP 147/90 | HR 77 | Ht 66.0 in | Wt 161.0 lb

## 2018-07-19 DIAGNOSIS — R911 Solitary pulmonary nodule: Secondary | ICD-10-CM | POA: Diagnosis not present

## 2018-07-19 DIAGNOSIS — Z87448 Personal history of other diseases of urinary system: Secondary | ICD-10-CM

## 2018-07-19 DIAGNOSIS — R3914 Feeling of incomplete bladder emptying: Secondary | ICD-10-CM

## 2018-07-19 LAB — BLADDER SCAN AMB NON-IMAGING

## 2018-07-19 NOTE — Addendum Note (Signed)
Addended by: Tommy Rainwater on: 07/19/2018 04:38 PM   Modules accepted: Orders

## 2018-07-29 ENCOUNTER — Telehealth: Payer: Self-pay | Admitting: *Deleted

## 2018-07-29 NOTE — Telephone Encounter (Signed)
Patient has been notified that lung cancer screening CT scan is due currently or will be in near future. Confirmed that patient is within the appropriate age range, and asymptomatic, (no signs or symptoms of lung cancer). Patient denies illness that would prevent curative treatment for lung cancer if found. Verified smoking history: States that she is a smoker and smokes a pack daily. Patient is agreeable for CT scan being scheduled.

## 2018-08-02 ENCOUNTER — Other Ambulatory Visit: Payer: Self-pay | Admitting: *Deleted

## 2018-08-02 DIAGNOSIS — Z122 Encounter for screening for malignant neoplasm of respiratory organs: Secondary | ICD-10-CM

## 2018-08-02 DIAGNOSIS — Z87891 Personal history of nicotine dependence: Secondary | ICD-10-CM

## 2018-08-03 ENCOUNTER — Telehealth: Payer: Self-pay | Admitting: Family Medicine

## 2018-08-03 NOTE — Telephone Encounter (Signed)
Pt thinks that she has thrush and she doesn't know if she needs to take diflucan ( which she has)  or if something else needs to be called in or if she needs to be seen  Please call [phone]

## 2018-08-03 NOTE — Telephone Encounter (Signed)
Please schedule her an appointment- can be virtual as long as it's video

## 2018-08-03 NOTE — Telephone Encounter (Signed)
Doximity visit scheduled for tomorrow morning.

## 2018-08-04 ENCOUNTER — Ambulatory Visit (INDEPENDENT_AMBULATORY_CARE_PROVIDER_SITE_OTHER): Payer: Medicare Other | Admitting: Family Medicine

## 2018-08-04 ENCOUNTER — Other Ambulatory Visit: Payer: Self-pay

## 2018-08-04 ENCOUNTER — Encounter: Payer: Self-pay | Admitting: Family Medicine

## 2018-08-04 VITALS — Temp 97.4°F | Ht 66.0 in | Wt 161.0 lb

## 2018-08-04 DIAGNOSIS — B370 Candidal stomatitis: Secondary | ICD-10-CM

## 2018-08-04 DIAGNOSIS — Z1211 Encounter for screening for malignant neoplasm of colon: Secondary | ICD-10-CM

## 2018-08-04 MED ORDER — FLUCONAZOLE 150 MG PO TABS: 150.0000 mg | ORAL_TABLET | Freq: Once | ORAL | 0 refills | Status: AC

## 2018-08-04 NOTE — Progress Notes (Signed)
Temp (!) 97.4 F (36.3 C) (Oral)   Ht 5\' 6"  (1.676 m)   Wt 161 lb (73 kg)   LMP  (LMP Unknown)   BMI 25.99 kg/m    Subjective:    Patient ID: Diane Pearson, female    DOB: 02/06/1956, 63 y.o.   MRN: [id number]  HPI: [name] is a 63 y.o. female  Chief Complaint  Patient presents with  . Oral Pain    Bumps on tounge, pain on edge of tounge.    Has been having some pain on her tongue for about a week. She just finished some fungal medicine and antibiotics. Bumps on the back of her tongue and pain on the edge of her tongue. +White film on her tongue. She is noticing a funny taste. She is otherwise feeling well with no other concerns or complaints at this time.   Relevant past medical, surgical, family and social history reviewed and updated as indicated. Interim medical history since our last visit reviewed. Allergies and medications reviewed and updated.  Review of Systems  Constitutional: Negative.   HENT: Positive for dental problem. Negative for congestion, drooling, ear discharge, ear pain, facial swelling, hearing loss, mouth sores, nosebleeds, postnasal drip, rhinorrhea, sinus pressure, sinus pain, sneezing, sore throat, tinnitus, trouble swallowing and voice change.   Respiratory: Negative.   Cardiovascular: Negative.   Neurological: Negative.   Psychiatric/Behavioral: Negative.     Per HPI unless specifically indicated above     Objective:    Temp (!) 97.4 F (36.3 C) (Oral)   Ht 5\' 6"  (1.676 m)   Wt 161 lb (73 kg)   LMP  (LMP Unknown)   BMI 25.99 kg/m   Wt Readings from Last 3 Encounters:  08/04/18 161 lb (73 kg)  07/19/18 161 lb (73 kg)  01/13/18 164 lb (74.4 kg)    Physical Exam Vitals signs and nursing note reviewed.  Constitutional:      General: She is not in acute distress.    Appearance: Normal appearance. She is not ill-appearing, toxic-appearing or diaphoretic.  HENT:     Head: Normocephalic and atraumatic.     Right Ear: External  ear normal.     Left Ear: External ear normal.     Nose: Nose normal.     Mouth/Throat:     Mouth: Mucous membranes are moist.     Pharynx: Oropharynx is clear.     Comments: Irritated cracked red tongue with white film Eyes:     General: No scleral icterus.       Right eye: No discharge.        Left eye: No discharge.     Conjunctiva/sclera: Conjunctivae normal.     Pupils: Pupils are equal, round, and reactive to light.  Neck:     Musculoskeletal: Normal range of motion.  Pulmonary:     Effort: Pulmonary effort is normal. No respiratory distress.     Comments: Speaking in full sentences Musculoskeletal: Normal range of motion.  Skin:    Coloration: Skin is not jaundiced or pale.     Findings: No bruising, erythema, lesion or rash.  Neurological:     Mental Status: She is alert and oriented to person, place, and time. Mental status is at baseline.  Psychiatric:        Mood and Affect: Mood normal.        Behavior: Behavior normal.        Thought Content: Thought content normal.  Judgment: Judgment normal.     Results for orders placed or performed in visit on 07/19/18  Bladder Scan (Post Void Residual) in office  Result Value Ref Range   Scan Result 179mL       Assessment & Plan:   Problem List Items Addressed This Visit    None    Visit Diagnoses    Thrush    -  Primary   Will treat with diflucan. Call if not getting better or getting worse. Continue to monitor.    Relevant Medications   fluconazole (DIFLUCAN) 150 MG tablet   Colon cancer screening       Referral to GI made today.   Relevant Orders   Ambulatory referral to Gastroenterology       Follow up plan: Return if symptoms worsen or fail to improve.    . This visit was completed via Doximity due to the restrictions of the COVID-19 pandemic. All issues as above were discussed and addressed. Physical exam was done as above through visual confirmation on Doximity. If it was felt that the patient  should be evaluated in the office, they were directed there. The patient verbally consented to this visit. . Location of the patient: home . Location of the provider: work . Those involved with this call:  . Provider: Park Liter, DO . CMA: Gerda Diss, CMA . Front Desk/Registration: Don Perking  . Time spent on call: 15 minutes with patient face to face via video conference. More than 50% of this time was spent in counseling and coordination of care. 23 minutes total spent in review of patient's record and preparation of their chart.

## 2018-08-07 ENCOUNTER — Other Ambulatory Visit: Payer: Self-pay | Admitting: Family Medicine

## 2018-08-07 DIAGNOSIS — M1712 Unilateral primary osteoarthritis, left knee: Secondary | ICD-10-CM | POA: Diagnosis not present

## 2018-08-07 NOTE — Telephone Encounter (Signed)
Called and spoke to patient. She states that she is leaving to go out of town on 08/17/18 and will not be back until 09/19/18. Can get Ambien refilled one time before she leaves as it was filled 06/13/18 but will run out before she returns. Lyrica was also filled 06/13/18 but will run out before she returns. Asked patient if she would be able to transfer the prescriptions while she is gone and she said no because there is nothing in the town she is going to.  Dr. Wynetta Emery, can the patient refill her RX's early?

## 2018-08-07 NOTE — Telephone Encounter (Signed)
Pt is returning Brittany's call.

## 2018-08-07 NOTE — Telephone Encounter (Signed)
Please let pharmacy know that they can give her enough to make it to her return early- please let patient know that she may have to pay out of pocket for that, as it's the insurance that has the issue. Thanks!

## 2018-08-07 NOTE — Telephone Encounter (Signed)
Called pharmacy. Patient has refills on both medications. Wanted to know if they could fill early because the patient told them that she was going out of town for a month. Will call patient and find out how far in advance she is needing the refills.

## 2018-08-07 NOTE — Telephone Encounter (Signed)
Called and left patient a VM asking for her to please return my call.  

## 2018-08-07 NOTE — Telephone Encounter (Signed)
Refill of both of those medicines given with 5 refills in May. Please confirm with pharmacy that they got them and otherwise call them in.

## 2018-08-07 NOTE — Telephone Encounter (Signed)
Medication Refill - Medication: zolpidem (AMBIEN) 10 MG tablet  pregabalin (LYRICA) 150 MG capsule    Has the patient contacted their pharmacy? Yes.    (Agent: If yes, when and what did the pharmacy advise?)Pt stated that the pharmacy needed a form from the doctor office to refill medication.   Preferred Pharmacy (with phone number or street name):  [facility], [place] [phone] (Phone) [phone] (Fax)     Agent: Please be advised that RX refills may take up to 3 business days. We ask that you follow-up with your pharmacy.

## 2018-08-08 ENCOUNTER — Other Ambulatory Visit: Payer: Self-pay

## 2018-08-08 ENCOUNTER — Telehealth: Payer: Self-pay

## 2018-08-08 DIAGNOSIS — Z1211 Encounter for screening for malignant neoplasm of colon: Secondary | ICD-10-CM

## 2018-08-08 NOTE — Telephone Encounter (Signed)
Gastroenterology Pre-Procedure Review  Request Date: 09/29/18 Requesting Physician: Dr. Bonna Gains  PATIENT REVIEW QUESTIONS: The patient responded to the following health history questions as indicated:    1. Are you having any GI issues? no 2. Do you have a personal history of Polyps? history of diverticulosis has not had any problems 3. Do you have a family history of Colon Cancer or Polyps? No family history of colon cancer or polyps, however sister has ulcerative colitis, mother history of diverticulitis 4. Diabetes Mellitus? no 5. Joint replacements in the past 12 months?no 6. Major health problems in the past 3 months?no 7. Any artificial heart valves, MVP, or defibrillator?no    MEDICATIONS & ALLERGIES:    Patient reports the following regarding taking any anticoagulation/antiplatelet therapy:   Plavix, Coumadin, Eliquis, Xarelto, Lovenox, Pradaxa, Brilinta, or Effient? no Aspirin? no  Patient confirms/reports the following medications:  Current Outpatient Medications  Medication Sig Dispense Refill  . Calcium Carbonate-Vitamin D (CALCIUM 600+D PO) Take 600 mg by mouth daily.    . cholecalciferol (VITAMIN D) 1000 UNITS tablet Take 1,000 Units by mouth daily.    . cyclobenzaprine (FLEXERIL) 10 MG tablet TAKE ONE TABLET BY MOUTH THREE TIMES A DAY FOR SPASMS 270 tablet 0  . diclofenac sodium (VOLTAREN) 1 % GEL Apply 2 g topically 4 (four) times daily.    . DULoxetine (CYMBALTA) 60 MG capsule Take 1 capsule (60 mg total) by mouth daily. 90 capsule 1  . estrogen, conjugated,-medroxyprogesterone (PREMPRO) 0.625-2.5 MG tablet Take 1 tablet by mouth daily. 84 tablet 3  . famotidine (PEPCID) 10 MG tablet Take 10 mg by mouth at bedtime.    . hydrOXYzine (ATARAX/VISTARIL) 25 MG tablet Take 1 tablet (25 mg total) by mouth daily. 30 tablet 2  . ibuprofen (ADVIL,MOTRIN) 800 MG tablet Take 800 mg by mouth at bedtime as needed.    Marland Kitchen levothyroxine (SYNTHROID, LEVOTHROID) 50 MCG tablet Take 1  tablet (50 mcg total) by mouth daily before breakfast. 90 tablet 1  . lidocaine (XYLOCAINE) 5 % ointment Apply 1 application topically 3 (three) times daily as needed.   1  . Magnesium 400 MG TABS Take 400 mg by mouth daily.    . metoprolol succinate (TOPROL-XL) 50 MG 24 hr tablet Take 1 tablet (50 mg total) by mouth daily. Take with or immediately following a meal. 90 tablet 1  . Multiple Vitamins-Minerals (CENTRUM SILVER ADULT 50+ PO) Take 1 tablet by mouth daily.    . Potassium 99 MG TABS Take 99 mg by mouth daily.    . pregabalin (LYRICA) 100 MG capsule Take 1 capsule (100 mg total) by mouth 2 (two) times daily. 180 capsule 1  . triamcinolone cream (KENALOG) 0.1 % Apply 1 application topically 2 (two) times daily.     . vitamin B-12 (CYANOCOBALAMIN) 500 MCG tablet Take 500 mcg by mouth daily.    Marland Kitchen zolpidem (AMBIEN) 10 MG tablet Take 1 tablet (10 mg total) by mouth at bedtime. 30 tablet 5   No current facility-administered medications for this visit.     Patient confirms/reports the following allergies:  Allergies  Allergen Reactions  . Erythromycin Other (See Comments)    cramps  . Gabapentin Other (See Comments)    tremors  . Tamiflu [Oseltamivir Phosphate] Other (See Comments)    Cramps and stomach issues   . Tramadol     headaches  . Nitrofurantoin Diarrhea and Nausea And Vomiting    No orders of the defined types were placed in this  encounter.   AUTHORIZATION INFORMATION Primary Insurance: 1D#: Group #:  Secondary Insurance: 1D#: Group #:  SCHEDULE INFORMATION: Date: 09/29/18 Time: Location:[facility]

## 2018-08-08 NOTE — Telephone Encounter (Signed)
Pharmacy notified of what Dr. Wynetta Emery said. Also let the patient know as well.

## 2018-08-10 ENCOUNTER — Other Ambulatory Visit: Payer: Self-pay

## 2018-08-10 ENCOUNTER — Ambulatory Visit
Admission: RE | Admit: 2018-08-10 | Discharge: 2018-08-10 | Disposition: A | Discharge status: Home / Self Care | Payer: Medicare Other | Source: Ambulatory Visit | Attending: Oncology | Admitting: Oncology

## 2018-08-10 DIAGNOSIS — Z87891 Personal history of nicotine dependence: Secondary | ICD-10-CM | POA: Insufficient documentation

## 2018-08-10 DIAGNOSIS — Z122 Encounter for screening for malignant neoplasm of respiratory organs: Secondary | ICD-10-CM | POA: Insufficient documentation

## 2018-08-11 ENCOUNTER — Other Ambulatory Visit: Payer: Self-pay | Admitting: Family Medicine

## 2018-08-11 NOTE — Telephone Encounter (Signed)
Requested Prescriptions  Pending Prescriptions Disp Refills  . levothyroxine (SYNTHROID) 50 MCG tablet [Pharmacy Med Name: LEVOTHYROXINE 50 MCG TABLET] 90 tablet 0    Sig: TAKE ONE TABLET BY MOUTH EVERY MORNING 80 MINUTES BEFORE BREAKFAST     Endocrinology:  Hypothyroid Agents Failed - 08/11/2018  1:09 PM      Failed - TSH needs to be rechecked within 3 months after an abnormal result. Refill until TSH is due.      Passed - TSH in normal range and within 360 days    TSH  Date Value Ref Range Status  06/22/2018 2.510 0.450 - 4.500 uIU/mL Final         Passed - Valid encounter within last 12 months    Recent Outpatient Visits          1 week ago [facility], [place], DO   8 months ago Hypothyroidism, unspecified type   [place], [place], DO   1 year ago Initial Medicare annual wellness visit   [place] Kathrine Haddock, NP   2 years ago [place], NP   2 years ago Acute bacterial conjunctivitis of both eyes   [facility] Volney American, [place]      Future Appointments            In 1 month  [place], [place]   In 1 month [place], Barb Merino, DO [employer], [place]   In 11 months [place], [place]

## 2018-08-14 ENCOUNTER — Encounter: Payer: Self-pay | Admitting: *Deleted

## 2018-09-26 ENCOUNTER — Other Ambulatory Visit: Payer: Self-pay

## 2018-09-26 ENCOUNTER — Other Ambulatory Visit
Admission: RE | Admit: 2018-09-26 | Discharge: 2018-09-26 | Disposition: A | Discharge status: Home / Self Care | Payer: Medicare Other | Source: Ambulatory Visit | Attending: Gastroenterology | Admitting: Gastroenterology

## 2018-09-26 DIAGNOSIS — Z01812 Encounter for preprocedural laboratory examination: Secondary | ICD-10-CM | POA: Diagnosis not present

## 2018-09-26 DIAGNOSIS — Z20828 Contact with and (suspected) exposure to other viral communicable diseases: Secondary | ICD-10-CM | POA: Diagnosis not present

## 2018-09-26 NOTE — Progress Notes (Signed)
Subjective:   Diane Pearson is a 63 y.o. female who presents for an Initial Medicare Annual Wellness Visit.  This visit is being conducted via phone call  - after an attmept to do on video chat - due to the COVID-19 pandemic. This patient has given me verbal consent via phone to conduct this visit, patient states they are participating from their home address. Some vital signs may be absent or patient reported.   Patient identification: identified by name, DOB, and current address.    Review of Systems      Cardiac Risk Factors include: dyslipidemia;hypertension;smoking/ tobacco exposure     Objective:    Today's Vitals   09/27/18 1059 09/27/18 1100  Weight: 165 lb (74.8 kg)   Height: 5\' 6"  (1.676 m)   PainSc:  5    Body mass index is 26.63 kg/m.  Advanced Directives 09/27/2018 11/29/2017  Does Patient Have a Medical Advance Directive? No No  Would patient like information on creating a medical advance directive? - No - Patient declined    Current Medications (verified) Outpatient Encounter Medications as of 09/27/2018  Medication Sig   calcium carbonate (OS-CAL - DOSED IN MG OF ELEMENTAL CALCIUM) 1250 (500 Ca) MG tablet Take 1 tablet by mouth.   cholecalciferol (VITAMIN D) 1000 UNITS tablet Take 1,000 Units by mouth daily.   cyclobenzaprine (FLEXERIL) 10 MG tablet TAKE ONE TABLET BY MOUTH THREE TIMES A DAY FOR SPASMS   DULoxetine (CYMBALTA) 60 MG capsule Take 1 capsule (60 mg total) by mouth daily.   estrogen, conjugated,-medroxyprogesterone (PREMPRO) 0.625-2.5 MG tablet Take 1 tablet by mouth daily.   famotidine (PEPCID) 10 MG tablet Take 10 mg by mouth at bedtime.   FIBER ADULT GUMMIES PO Take by mouth.   hydrOXYzine (ATARAX/VISTARIL) 25 MG tablet Take 1 tablet (25 mg total) by mouth daily.   ibuprofen (ADVIL,MOTRIN) 800 MG tablet Take 800 mg by mouth at bedtime as needed. Takes bid   levothyroxine (SYNTHROID) 50 MCG tablet TAKE ONE TABLET BY MOUTH EVERY  MORNING 30 MINUTES BEFORE BREAKFAST   lidocaine (XYLOCAINE) 5 % ointment Apply 1 application topically 3 (three) times daily as needed.    Magnesium 400 MG TABS Take 400 mg by mouth daily.   metoprolol succinate (TOPROL-XL) 50 MG 24 hr tablet Take 1 tablet (50 mg total) by mouth daily. Take with or immediately following a meal.   Multiple Vitamins-Minerals (CENTRUM SILVER ADULT 50+ PO) Take 1 tablet by mouth daily.   Potassium 99 MG TABS Take 99 mg by mouth daily.   pregabalin (LYRICA) 100 MG capsule Take 1 capsule (100 mg total) by mouth 2 (two) times daily.   vitamin B-12 (CYANOCOBALAMIN) 500 MCG tablet Take 500 mcg by mouth daily.   zolpidem (AMBIEN) 10 MG tablet Take 1 tablet (10 mg total) by mouth at bedtime.   diclofenac sodium (VOLTAREN) 1 % GEL Apply 2 g topically 4 (four) times daily.   [DISCONTINUED] Calcium Carbonate-Vitamin D (CALCIUM 600+D PO) Take 600 mg by mouth daily.   [DISCONTINUED] triamcinolone cream (KENALOG) 0.1 % Apply 1 application topically 2 (two) times daily.    No facility-administered encounter medications on file as of 09/27/2018.     Allergies (verified) Erythromycin, Gabapentin, Tamiflu [oseltamivir phosphate], Tramadol, and Nitrofurantoin   History: Past Medical History:  Diagnosis Date   Arthritis    CAD (coronary artery disease)    Diagnosed on CT scan   Depression    Depression    Hypothyroidism  Insomnia    Menopause    Osteoporosis    Tremors of nervous system    Past Surgical History:  Procedure Laterality Date   BREAST BIOPSY Left 04/20/2016   COLUMNAR CELL CHANGE   varicose veins  1998   Family History  Problem Relation Age of Onset   Cancer Father        Lung   Tuberculosis Father    Breast cancer Maternal Grandmother 73   Ulcerative colitis Sister    Anxiety disorder Daughter    Alzheimer's disease Maternal Grandfather    Heart disease Paternal Grandmother        MI   Heart disease Paternal  Grandfather        MI   Breast cancer Maternal Aunt        70's   Social History   Socioeconomic History   Marital status: Divorced    Spouse name: Not on file   Number of children: 1   Years of education: Not on file   Highest education level: 12th grade  Occupational History   Occupation: disability   Scientist, product/process development strain: Not hard at all   Food insecurity    Worry: Never true    Inability: Never true   Transportation needs    Medical: No    Non-medical: No  Tobacco Use   Smoking status: Current Every Day Smoker    Packs/day: 1.00    Years: 45.00    Pack years: 45.00    Types: Cigarettes   Smokeless tobacco: Never Used  Substance and Sexual Activity   Alcohol use: No    Alcohol/week: 0.0 standard drinks   Drug use: No   Sexual activity: Yes  Lifestyle   Physical activity    Days per week: 7 days    Minutes per session: 30 min   Stress: Not at all  Relationships   Social connections    Talks on phone: More than three times a week    Gets together: Never    Attends religious service: Never    Active member of club or organization: No    Attends meetings of clubs or organizations: Never    Relationship status: Divorced  Other Topics Concern   Not on file  Social History Narrative   Not on file    Tobacco Counseling Ready to quit: No Counseling given: Yes   Clinical Intake:  Pre-visit preparation completed: Yes  Pain : 0-10 Pain Score: 5  Pain Type: Chronic pain Pain Location: Knee Pain Orientation: Left Pain Descriptors / Indicators: Aching Pain Onset: More than a month ago Pain Frequency: Constant Pain Relieving Factors: cortizone shots  Pain Relieving Factors: cortizone shots  Nutritional Risks: None Diabetes: No  How often do you need to have someone help you when you read instructions, pamphlets, or other written materials from your doctor or pharmacy?: 1 - Never What is the last grade level you  completed in school?: high school  Interpreter Needed?: No  Information entered by :: Satcha Storlie,LPN   Activities of Daily Living In your present state of health, do you have any difficulty performing the following activities: 09/27/2018  Hearing? N  Comment no hearing aids  Vision? Y  Comment eyeglasses, "some days good some days not" goes to [facility]  Difficulty concentrating or making decisions? Y  Comment does come back  Walking or climbing stairs? Y  Dressing or bathing? N  Doing errands, shopping? N  Conservation officer, nature and  eating ? N  Using the Toilet? N  In the past six months, have you accidently leaked urine? N  Do you have problems with loss of bowel control? N  Managing your Medications? N  Managing your Finances? N  Housekeeping or managing your Housekeeping? N  Some recent data might be hidden     Immunizations and Health Maintenance Immunization History  Administered Date(s) Administered   Pneumococcal Polysaccharide-23 01/14/2014   Td 02/08/2009   Health Maintenance Due  Topic Date Due   COLONOSCOPY  01/08/2007   INFLUENZA VACCINE  09/09/2018    Patient Care Team: Valerie Roys, DO as PCP - General (Family Medicine) Rico Junker, RN as Registered Nurse Theodore Demark, RN as Registered Nurse  Indicate any recent Medical Services you may have received from other than [facility] providers in the past year (date may be approximate).     Assessment:   This is a routine wellness examination for Diane Pearson.  Hearing/Vision screen No exam data present  Dietary issues and exercise activities discussed: Current Exercise Habits: Home exercise routine, Type of exercise: walking, Time (Minutes): 30, Frequency (Times/Week): 7, Weekly Exercise (Minutes/Week): 210, Intensity: Mild, Exercise limited by: None identified  Goals   None    Depression Screen PHQ 2/9 Scores 09/27/2018 06/13/2018 11/29/2017 05/30/2017 04/16/2016 09/29/2015 09/25/2014  PHQ - 2 Score 0 0  2 0 6 0 0  PHQ- 9 Score - 5 12 11 19  - -  Exception Documentation - - - - - Other- indicate reason in comment box -  Not completed - - - - - pt states she only gets depressed when she cant do things becuase of pain or exhausation -    Fall Risk Fall Risk  09/27/2018 11/29/2017 05/30/2017 09/29/2015 09/25/2014  Falls in the past year? 0 No Yes No Yes  Number falls in past yr: - - 2 or more - 2 or more  Injury with Fall? - - No - No  Follow up - - Falls evaluation completed - -   FALL RISK PREVENTION PERTAINING TO THE HOME:  Any stairs in or around the home? Yes  If so, are there any without handrails? No   Home free of loose throw rugs in walkways, pet beds, electrical cords, etc? Yes  Adequate lighting in your home to reduce risk of falls? Yes   ASSISTIVE DEVICES UTILIZED TO PREVENT FALLS:  Life alert? No  Use of a cane, walker or w/c? No has cane if needed Grab bars in the bathroom? No  Shower chair or bench in shower? No  Elevated toilet seat or a handicapped toilet? No    DME ORDERS:  DME order needed?  No   TIMED UP AND GO:  Unable to perform     Cognitive Function:        Screening Tests Health Maintenance  Topic Date Due   COLONOSCOPY  01/08/2007   INFLUENZA VACCINE  09/09/2018   TETANUS/TDAP  02/09/2019   MAMMOGRAM  07/10/2020   PAP SMEAR-Modifier  12/16/2022   Hepatitis C Screening  Completed   HIV Screening  Completed    Qualifies for Shingles Vaccine? Yes  Zostavax completed n/a. Due for Shingrix. Education has been provided regarding the importance of this vaccine. Pt has been advised to call insurance company to determine out of pocket expense. Advised may also receive vaccine at local pharmacy or Health Dept. Verbalized acceptance and understanding.  Tdap: up to date   Flu Vaccine: Due 10/2018  Pneumococcal Vaccine: not indicated   Cancer Screenings:  Colorectal Screening: scheduled   Mammogram: Completed 07/11/2018. Repeat every year;     Bone Density: not indicated   Lung Cancer Screening: (Low Dose CT Chest recommended if Age 78-80 years, 30 pack-year currently smoking OR have quit w/in 15years.) does qualify.   Completed 08/10/2018   Additional Screening:  Hepatitis C Screening: does qualify; Completed 09/25/2014  Vision Screening: Recommended annual ophthalmology exams for early detection of glaucoma and other disorders of the eye. Is the patient up to date with their annual eye exam?  Yes  Who is the provider or what is the name of the office in which the pt attends annual eye exams? [facility]   Dental Screening: Recommended annual dental exams for proper oral hygiene  Community Resource Referral:  CRR required this visit?  No       Plan:  I have personally reviewed and addressed the Medicare Annual Wellness questionnaire and have noted the following in the patients chart:  A. Medical and social history B. Use of alcohol, tobacco or illicit drugs  C. Current medications and supplements D. Functional ability and status E.  Nutritional status F.  Physical activity G. Advance directives H. List of other physicians I.  Hospitalizations, surgeries, and ER visits in previous 12 months J.  [place] such as hearing and vision if needed, cognitive and depression L. Referrals and appointments   In addition, I have reviewed and discussed with patient certain preventive protocols, quality metrics, and best practice recommendations. A written personalized care plan for preventive services as well as general preventive health recommendations were provided to patient.   Signed,    Bevelyn Ngo, LPN   3/57/0177  Nurse Health Advisor   Nurse Notes: needs refills on prempro and voltaren gel

## 2018-09-27 ENCOUNTER — Ambulatory Visit (INDEPENDENT_AMBULATORY_CARE_PROVIDER_SITE_OTHER): Payer: Medicare Other

## 2018-09-27 VITALS — Ht 66.0 in | Wt 165.0 lb

## 2018-09-27 DIAGNOSIS — Z0000 Encounter for general adult medical examination without abnormal findings: Secondary | ICD-10-CM | POA: Diagnosis not present

## 2018-09-27 LAB — SARS CORONAVIRUS 2 (TAT 6-24 HRS): SARS Coronavirus 2: NEGATIVE

## 2018-09-27 NOTE — Patient Instructions (Addendum)
[name] , Thank you for taking time to come for your Medicare Wellness Visit. I appreciate your ongoing commitment to your health goals. Please review the following plan we discussed and let me know if I can assist you in the future.   Screening recommendations/referrals: Colonoscopy: scheduled  Mammogram: completed 07/11/2018 Bone Density: not indicated Recommended yearly ophthalmology/optometry visit for glaucoma screening and checkup Recommended yearly dental visit for hygiene and checkup  Vaccinations: Influenza vaccine: due 10/2018 Pneumococcal vaccine: not indicicated Tdap vaccine: up to date Shingles vaccine: shingrix eligible, check with your insurance for coverage    Advanced directives: please pick up a copy of this information at your next office visit.   Conditions/risks identified: If you wish to quit smoking, help is available. For free tobacco cessation program offerings call the [facility] at 206-251-6927 or Live Well Line at 854-223-6608. You may also visit [email or web address] or email [email or web address] for more information on other programs.   Next appointment: Follow up in one year for your annual wellness visit.   Preventive Care 40-64 Years, Female Preventive care refers to lifestyle choices and visits with your health care provider that can promote health and wellness. What does preventive care include?  A yearly physical exam. This is also called an annual well check.  Dental exams once or twice a year.  Routine eye exams. Ask your health care provider how often you should have your eyes checked.  Personal lifestyle choices, including:  Daily care of your teeth and gums.  Regular physical activity.  Eating a healthy diet.  Avoiding tobacco and drug use.  Limiting alcohol use.  Practicing safe sex.  Taking low-dose aspirin daily starting at age 35.  Taking vitamin and mineral supplements as recommended by your health  care provider. What happens during an annual well check? The services and screenings done by your health care provider during your annual well check will depend on your age, overall health, lifestyle risk factors, and family history of disease. Counseling  Your health care provider may ask you questions about your:  Alcohol use.  Tobacco use.  Drug use.  Emotional well-being.  Home and relationship well-being.  Sexual activity.  Eating habits.  Work and work Statistician.  Method of birth control.  Menstrual cycle.  Pregnancy history. Screening  You may have the following tests or measurements:  Height, weight, and BMI.  Blood pressure.  Lipid and cholesterol levels. These may be checked every 5 years, or more frequently if you are over 58 years old.  Skin check.  Lung cancer screening. You may have this screening every year starting at age 70 if you have a 30-pack-year history of smoking and currently smoke or have quit within the past 15 years.  Fecal occult blood test (FOBT) of the stool. You may have this test every year starting at age 59.  Flexible sigmoidoscopy or colonoscopy. You may have a sigmoidoscopy every 5 years or a colonoscopy every 10 years starting at age 68.  Hepatitis C blood test.  Hepatitis B blood test.  Sexually transmitted disease (STD) testing.  Diabetes screening. This is done by checking your blood sugar (glucose) after you have not eaten for a while (fasting). You may have this done every 1-3 years.  Mammogram. This may be done every 1-2 years. Talk to your health care provider about when you should start having regular mammograms. This may depend on whether you have a family history of breast cancer.  BRCA-related cancer  screening. This may be done if you have a family history of breast, ovarian, tubal, or peritoneal cancers.  Pelvic exam and Pap test. This may be done every 3 years starting at age 30. Starting at age 39, this may  be done every 5 years if you have a Pap test in combination with an HPV test.  Bone density scan. This is done to screen for osteoporosis. You may have this scan if you are at high risk for osteoporosis. Discuss your test results, treatment options, and if necessary, the need for more tests with your health care provider. Vaccines  Your health care provider may recommend certain vaccines, such as:  Influenza vaccine. This is recommended every year.  Tetanus, diphtheria, and acellular pertussis (Tdap, Td) vaccine. You may need a Td booster every 10 years.  Zoster vaccine. You may need this after age 76.  Pneumococcal 13-valent conjugate (PCV13) vaccine. You may need this if you have certain conditions and were not previously vaccinated.  Pneumococcal polysaccharide (PPSV23) vaccine. You may need one or two doses if you smoke cigarettes or if you have certain conditions. Talk to your health care provider about which screenings and vaccines you need and how often you need them. This information is not intended to replace advice given to you by your health care provider. Make sure you discuss any questions you have with your health care provider. Document Released: 02/21/2015 Document Revised: 10/15/2015 Document Reviewed: 11/26/2014 Elsevier Interactive Patient Education  2017 [place] Prevention in the Home Falls can cause injuries. They can happen to people of all ages. There are many things you can do to make your home safe and to help prevent falls. What can I do on the outside of my home?  Regularly fix the edges of walkways and driveways and fix any cracks.  Remove anything that might make you trip as you walk through a door, such as a raised step or threshold.  Trim any bushes or trees on the path to your home.  Use bright outdoor lighting.  Clear any walking paths of anything that might make someone trip, such as rocks or tools.  Regularly check to see if  handrails are loose or broken. Make sure that both sides of any steps have handrails.  Any raised decks and porches should have guardrails on the edges.  Have any leaves, snow, or ice cleared regularly.  Use sand or salt on walking paths during winter.  Clean up any spills in your garage right away. This includes oil or grease spills. What can I do in the bathroom?  Use night lights.  Install grab bars by the toilet and in the tub and shower. Do not use towel bars as grab bars.  Use non-skid mats or decals in the tub or shower.  If you need to sit down in the shower, use a plastic, non-slip stool.  Keep the floor dry. Clean up any water that spills on the floor as soon as it happens.  Remove soap buildup in the tub or shower regularly.  Attach bath mats securely with double-sided non-slip rug tape.  Do not have throw rugs and other things on the floor that can make you trip. What can I do in the bedroom?  Use night lights.  Make sure that you have a light by your bed that is easy to reach.  Do not use any sheets or blankets that are too big for your bed. They should not  hang down onto the floor.  Have a firm chair that has side arms. You can use this for support while you get dressed.  Do not have throw rugs and other things on the floor that can make you trip. What can I do in the kitchen?  Clean up any spills right away.  Avoid walking on wet floors.  Keep items that you use a lot in easy-to-reach places.  If you need to reach something above you, use a strong step stool that has a grab bar.  Keep electrical cords out of the way.  Do not use floor polish or wax that makes floors slippery. If you must use wax, use non-skid floor wax.  Do not have throw rugs and other things on the floor that can make you trip. What can I do with my stairs?  Do not leave any items on the stairs.  Make sure that there are handrails on both sides of the stairs and use them. Fix  handrails that are broken or loose. Make sure that handrails are as long as the stairways.  Check any carpeting to make sure that it is firmly attached to the stairs. Fix any carpet that is loose or worn.  Avoid having throw rugs at the top or bottom of the stairs. If you do have throw rugs, attach them to the floor with carpet tape.  Make sure that you have a light switch at the top of the stairs and the bottom of the stairs. If you do not have them, ask someone to add them for you. What else can I do to help prevent falls?  Wear shoes that:  Do not have high heels.  Have rubber bottoms.  Are comfortable and fit you well.  Are closed at the toe. Do not wear sandals.  If you use a stepladder:  Make sure that it is fully opened. Do not climb a closed stepladder.  Make sure that both sides of the stepladder are locked into place.  Ask someone to hold it for you, if possible.  Clearly mark and make sure that you can see:  Any grab bars or handrails.  First and last steps.  Where the edge of each step is.  Use tools that help you move around (mobility aids) if they are needed. These include:  Canes.  Walkers.  Scooters.  Crutches.  Turn on the lights when you go into a dark area. Replace any light bulbs as soon as they burn out.  Set up your furniture so you have a clear path. Avoid moving your furniture around.  If any of your floors are uneven, fix them.  If there are any pets around you, be aware of where they are.  Review your medicines with your doctor. Some medicines can make you feel dizzy. This can increase your chance of falling. Ask your doctor what other things that you can do to help prevent falls. This information is not intended to replace advice given to you by your health care provider. Make sure you discuss any questions you have with your health care provider. Document Released: 11/21/2008 Document Revised: 07/03/2015 Document Reviewed: 03/01/2014  Elsevier Interactive Patient Education  2017 [employer].

## 2018-09-29 ENCOUNTER — Encounter: Payer: Self-pay | Admitting: *Deleted

## 2018-09-29 ENCOUNTER — Ambulatory Visit
Admission: RE | Admit: 2018-09-29 | Discharge: 2018-09-29 | Disposition: A | Discharge status: Home / Self Care | Payer: Medicare Other | Attending: Gastroenterology | Admitting: Gastroenterology

## 2018-09-29 ENCOUNTER — Ambulatory Visit: Payer: Medicare Other | Admitting: Anesthesiology

## 2018-09-29 ENCOUNTER — Encounter
Admission: RE | Disposition: A | Discharge status: Home / Self Care | Payer: Self-pay | Source: Home / Self Care | Attending: Gastroenterology

## 2018-09-29 ENCOUNTER — Telehealth: Payer: Self-pay | Admitting: Family Medicine

## 2018-09-29 DIAGNOSIS — F329 Major depressive disorder, single episode, unspecified: Secondary | ICD-10-CM | POA: Insufficient documentation

## 2018-09-29 DIAGNOSIS — Z79899 Other long term (current) drug therapy: Secondary | ICD-10-CM | POA: Diagnosis not present

## 2018-09-29 DIAGNOSIS — F17210 Nicotine dependence, cigarettes, uncomplicated: Secondary | ICD-10-CM | POA: Insufficient documentation

## 2018-09-29 DIAGNOSIS — I2510 Atherosclerotic heart disease of native coronary artery without angina pectoris: Secondary | ICD-10-CM | POA: Insufficient documentation

## 2018-09-29 DIAGNOSIS — K635 Polyp of colon: Secondary | ICD-10-CM | POA: Insufficient documentation

## 2018-09-29 DIAGNOSIS — Z79890 Hormone replacement therapy (postmenopausal): Secondary | ICD-10-CM | POA: Insufficient documentation

## 2018-09-29 DIAGNOSIS — E039 Hypothyroidism, unspecified: Secondary | ICD-10-CM | POA: Insufficient documentation

## 2018-09-29 DIAGNOSIS — Z1211 Encounter for screening for malignant neoplasm of colon: Secondary | ICD-10-CM | POA: Diagnosis not present

## 2018-09-29 DIAGNOSIS — K621 Rectal polyp: Secondary | ICD-10-CM | POA: Insufficient documentation

## 2018-09-29 DIAGNOSIS — Z7689 Persons encountering health services in other specified circumstances: Secondary | ICD-10-CM | POA: Diagnosis not present

## 2018-09-29 DIAGNOSIS — G4700 Insomnia, unspecified: Secondary | ICD-10-CM | POA: Insufficient documentation

## 2018-09-29 HISTORY — PX: COLONOSCOPY WITH PROPOFOL: SHX5780

## 2018-09-29 SURGERY — COLONOSCOPY WITH PROPOFOL
Anesthesia: General

## 2018-09-29 MED ORDER — PROPOFOL 500 MG/50ML IV EMUL
INTRAVENOUS | Status: DC | PRN
  Administered 2018-09-29: 200 ug/kg/min via INTRAVENOUS

## 2018-09-29 MED ORDER — LIDOCAINE HCL (CARDIAC) PF 100 MG/5ML IV SOSY
PREFILLED_SYRINGE | INTRAVENOUS | Status: DC | PRN
  Administered 2018-09-29: 60 mg via INTRATRACHEAL

## 2018-09-29 MED ORDER — SODIUM CHLORIDE 0.9 % IV SOLN
INTRAVENOUS | Status: DC | PRN
  Administered 2018-09-29: 13:00:00 via INTRAVENOUS

## 2018-09-29 MED ORDER — SODIUM CHLORIDE 0.9 % IV SOLN
INTRAVENOUS | Status: DC
  Administered 2018-09-29: 10:00:00 via INTRAVENOUS

## 2018-09-29 MED ORDER — PROPOFOL 500 MG/50ML IV EMUL
INTRAVENOUS | Status: AC
  Filled 2018-09-29: qty 50

## 2018-09-29 MED ORDER — PROPOFOL 10 MG/ML IV BOLUS
INTRAVENOUS | Status: DC | PRN
  Administered 2018-09-29: 10 mg via INTRAVENOUS
  Administered 2018-09-29: 100 mg via INTRAVENOUS

## 2018-09-29 NOTE — Op Note (Signed)
[facility] Gastroenterology Patient Name: Delara Markworth Procedure Date: 09/29/2018 1:19 PM MRN: [id number] Account #: [id number] Date of Birth: 04-12-1955 Admit Type: Outpatient Age: 63 Room: [facility] ENDO ROOM 3 Gender: Female Note Status: Finalized Procedure:            Colonoscopy Indications:          Screening for colorectal malignant neoplasm Providers:            Ladesha Pacini B. Bonna Gains MD, MD Referring MD:         Valerie Roys (Referring MD) Medicines:            Monitored Anesthesia Care Complications:        No immediate complications. Procedure:            Pre-Anesthesia Assessment:                       - ASA Grade Assessment: II - A patient with mild                        systemic disease.                       - Prior to the procedure, a History and Physical was                        performed, and patient medications, allergies and                        sensitivities were reviewed. The patient's tolerance of                        previous anesthesia was reviewed.                       - The risks and benefits of the procedure and the                        sedation options and risks were discussed with the                        patient. All questions were answered and informed                        consent was obtained.                       - Patient identification and proposed procedure were                        verified prior to the procedure by the physician, the                        nurse, the anesthesiologist, the anesthetist and the                        technician. The procedure was verified in the procedure                        room.  After obtaining informed consent, the colonoscope was                        passed under direct vision. Throughout the procedure,                        the patient's blood pressure, pulse, and oxygen                        saturations were monitored continuously. The                  Colonoscope was introduced through the anus and                        advanced to the the terminal ileum, with identification                        of the appendiceal orifice and IC valve. The                        colonoscopy was performed with ease. The patient                        tolerated the procedure well. The quality of the bowel                        preparation was good. Findings:      The perianal and digital rectal examinations were normal.      A 5 mm polyp was found in the transverse colon. The polyp was sessile.       The polyp was removed with a cold snare. Resection and retrieval were       complete.      A 4 mm polyp was found in the rectum. The polyp was sessile. The polyp       was removed with a cold biopsy forceps. Resection and retrieval were       complete.      The exam was otherwise without abnormality.      The rectum, sigmoid colon, descending colon, transverse colon, ascending       colon and cecum appeared normal.      Retroflexion in the rectum was not performed due to Narrow rectum.       Careful frontal view of the rectum was otherwise normal. Impression:           - One 5 mm polyp in the transverse colon, removed with                        a cold snare. Resected and retrieved.                       - One 4 mm polyp in the rectum, removed with a cold                        biopsy forceps. Resected and retrieved.                       - The examination was otherwise normal.                       -  The rectum, sigmoid colon, descending colon,                        transverse colon, ascending colon and cecum are normal. Recommendation:       - Discharge patient to home (with escort).                       - Advance diet as tolerated.                       - Continue present medications.                       - Await pathology results.                       - Repeat colonoscopy in 5 years.                       - The findings and  recommendations were discussed with                        the patient.                       - The findings and recommendations were discussed with                        the patient's family.                       - Return to primary care physician as previously                        scheduled. Procedure Code(s):    --- Professional ---                       [phone], Colonoscopy, flexible; with removal of tumor(s),                        polyp(s), or other lesion(s) by snare technique                       [id number], 38, Colonoscopy, flexible; with biopsy, single                        or multiple Diagnosis Code(s):    --- Professional ---                       Z12.11, Encounter for screening for malignant neoplasm                        of colon                       K63.5, Polyp of colon                       K62.1, Rectal polyp CPT copyright 2019 American Medical Association. All rights reserved. The codes documented in this report are preliminary and upon coder review may  be revised to meet current compliance requirements.  Vonda Antigua, MD Margretta Sidle B. Tajai Ihde MD, MD 09/29/2018 2:06:11 PM  This report has been signed electronically. Number of Addenda: 0 Note Initiated On: 09/29/2018 1:19 PM Scope Withdrawal Time: 0 hours 21 minutes 6 seconds  Total Procedure Duration: 0 hours 26 minutes 5 seconds  Estimated Blood Loss: Estimated blood loss: none.      [facility]

## 2018-09-29 NOTE — Anesthesia Post-op Follow-up Note (Signed)
Anesthesia QCDR form completed.        

## 2018-09-29 NOTE — H&P (Signed)
Diane Antigua, MD [address] [place], [place], [place], [address] [address], [place], [place], [place], [address] Phone: [phone]  Fax: [phone]  Primary Care Physician:  Valerie Roys, DO   Pre-Procedure History & Physical: HPI:  Diane Pearson is a 63 y.o. female is here for a colonoscopy.   Past Medical History:  Diagnosis Date  . Arthritis   . CAD (coronary artery disease)    Diagnosed on CT scan  . Depression   . Depression   . Hypothyroidism   . Insomnia   . Menopause   . Osteoporosis   . Tremors of nervous system     Past Surgical History:  Procedure Laterality Date  . BREAST BIOPSY Left 04/20/2016   COLUMNAR CELL CHANGE  . varicose veins  1998    Prior to Admission medications   Medication Sig Start Date End Date Taking? Authorizing Provider  cyclobenzaprine (FLEXERIL) 10 MG tablet TAKE ONE TABLET BY MOUTH THREE TIMES A DAY FOR SPASMS 03/07/18  Yes Johnson, Megan P, DO  DULoxetine (CYMBALTA) 60 MG capsule Take 1 capsule (60 mg total) by mouth daily. 06/13/18  Yes Johnson, Megan P, DO  estrogen, conjugated,-medroxyprogesterone (PREMPRO) 0.625-2.5 MG tablet Take 1 tablet by mouth daily. 04/11/18  Yes Johnson, Megan P, DO  ibuprofen (ADVIL,MOTRIN) 800 MG tablet Take 800 mg by mouth at bedtime as needed. Takes bid   Yes [provider]  levothyroxine (SYNTHROID) 50 MCG tablet TAKE ONE TABLET BY MOUTH EVERY MORNING 30 MINUTES BEFORE BREAKFAST 08/11/18  Yes Johnson, Megan P, DO  Magnesium 400 MG TABS Take 400 mg by mouth daily. 12/06/17  Yes [provider]  metoprolol succinate (TOPROL-XL) 50 MG 24 hr tablet Take 1 tablet (50 mg total) by mouth daily. Take with or immediately following a meal. 06/13/18  Yes Johnson, Megan P, DO  pregabalin (LYRICA) 100 MG capsule Take 1 capsule (100 mg total) by mouth 2 (two) times daily. 06/13/18  Yes Johnson, Megan P, DO  zolpidem (AMBIEN) 10 MG tablet Take 1 tablet (10 mg total) by mouth at bedtime.  06/13/18  Yes Johnson, Megan P, DO  calcium carbonate (OS-CAL - DOSED IN MG OF ELEMENTAL CALCIUM) 1250 (500 Ca) MG tablet Take 1 tablet by mouth.    [provider]  cholecalciferol (VITAMIN D) 1000 UNITS tablet Take 1,000 Units by mouth daily.    [provider]  diclofenac sodium (VOLTAREN) 1 % GEL Apply 2 g topically 4 (four) times daily.    [provider]  famotidine (PEPCID) 10 MG tablet Take 10 mg by mouth at bedtime.    [provider]  FIBER ADULT GUMMIES PO Take by mouth.    [provider]  hydrOXYzine (ATARAX/VISTARIL) 25 MG tablet Take 1 tablet (25 mg total) by mouth daily. 06/13/18   Johnson, Megan P, DO  lidocaine (XYLOCAINE) 5 % ointment Apply 1 application topically 3 (three) times daily as needed.  06/19/15   [provider]  Multiple Vitamins-Minerals (CENTRUM SILVER ADULT 50+ PO) Take 1 tablet by mouth daily.    [provider]  Potassium 99 MG TABS Take 99 mg by mouth daily.    [provider]  vitamin B-12 (CYANOCOBALAMIN) 500 MCG tablet Take 500 mcg by mouth daily.    [provider]    Allergies as of 08/08/2018 - Review Complete 08/04/2018  Allergen Reaction Noted  . Erythromycin Other (See Comments) 05/20/2014  . Gabapentin Other (See Comments) 05/20/2014  . Tamiflu [  oseltamivir phosphate] Other (See Comments) 09/25/2014  . Tramadol  03/03/2015  . Nitrofurantoin Diarrhea and Nausea And Vomiting 03/03/2015    Family History  Problem Relation Age of Onset  . Cancer Father        Lung  . Tuberculosis Father   . Breast cancer Maternal Grandmother 83  . Ulcerative colitis Sister   . Anxiety disorder Daughter   . Alzheimer's disease Maternal Grandfather   . Heart disease Paternal Grandmother        MI  . Heart disease Paternal Grandfather        MI  . Breast cancer Maternal Aunt        70's    Social History   Socioeconomic History  . Marital status: Divorced    Spouse name: Not  on file  . Number of children: 1  . Years of education: Not on file  . Highest education level: 12th grade  Occupational History  . Occupation: disability   Social Needs  . Financial resource strain: Not hard at all  . Food insecurity    Worry: Never true    Inability: Never true  . Transportation needs    Medical: No    Non-medical: No  Tobacco Use  . Smoking status: Current Every Day Smoker    Packs/day: 1.00    Years: 45.00    Pack years: 45.00    Types: Cigarettes  . Smokeless tobacco: Never Used  Substance and Sexual Activity  . Alcohol use: No    Alcohol/week: 0.0 standard drinks  . Drug use: No  . Sexual activity: Yes  Lifestyle  . Physical activity    Days per week: 7 days    Minutes per session: 30 min  . Stress: Not at all  Relationships  . Social Herbalist on phone: More than three times a week    Gets together: Never    Attends religious service: Never    Active member of club or organization: No    Attends meetings of clubs or organizations: Never    Relationship status: Divorced  . Intimate partner violence    Fear of current or ex partner: No    Emotionally abused: No    Physically abused: No    Forced sexual activity: No  Other Topics Concern  . Not on file  Social History Narrative  . Not on file    Review of Systems: See HPI, otherwise negative ROS  Physical Exam: LMP 09/28/2008 (Approximate)  General:   Alert,  pleasant and cooperative in NAD Head:  Normocephalic and atraumatic. Neck:  Supple; no masses or thyromegaly. Lungs:  Clear throughout to auscultation, normal respiratory effort.    Heart:  +S1, +S2, Regular rate and rhythm, No edema. Abdomen:  Soft, nontender and nondistended. Normal bowel sounds, without guarding, and without rebound.   Neurologic:  Alert and  oriented x4;  grossly normal neurologically.  Impression/Plan: Diane Pearson is here for a colonoscopy to be performed for average risk screening.  Risks,  benefits, limitations, and alternatives regarding  colonoscopy have been reviewed with the patient.  Questions have been answered.  All parties agreeable.   [name], MD  09/29/2018, 9:30 AM

## 2018-09-29 NOTE — Telephone Encounter (Signed)
Did not see provider- needs appointment. OK to be virtual

## 2018-09-29 NOTE — Anesthesia Preprocedure Evaluation (Addendum)
Anesthesia Evaluation  Patient identified by MRN, date of birth, ID band Patient awake    Reviewed: Allergy & Precautions, H&P , NPO status , Patient's Chart, lab work & pertinent test results  Airway Mallampati: II  TM Distance: >3 FB Neck ROM: full    Dental  (+) Teeth Intact   Pulmonary neg pulmonary ROS, neg shortness of breath, neg COPD, neg recent URI, Current Smoker,           Cardiovascular (-) angina+ CAD  (-) Past MI and (-) Cardiac Stents (-) dysrhythmias      Neuro/Psych PSYCHIATRIC DISORDERS Depression negative neurological ROS     GI/Hepatic negative GI ROS, Neg liver ROS,   Endo/Other  Hypothyroidism   Renal/GU negative Renal ROS  negative genitourinary   Musculoskeletal   Abdominal   Peds  Hematology negative hematology ROS (+)   Anesthesia Other Findings Past Medical History: No date: Arthritis No date: CAD (coronary artery disease)     Comment:  Diagnosed on CT scan No date: Depression No date: Depression No date: Hypothyroidism No date: Insomnia No date: Menopause No date: Osteoporosis No date: Tremors of nervous system  Past Surgical History: 04/20/2016: BREAST BIOPSY; Left     Comment:  COLUMNAR CELL CHANGE 1998: varicose veins     Reproductive/Obstetrics negative OB ROS                            Anesthesia Physical Anesthesia Plan  ASA: II  Anesthesia Plan: General   Post-op Pain Management:    Induction:   PONV Risk Score and Plan: Propofol infusion and TIVA  Airway Management Planned: Natural Airway and Nasal Cannula  Additional Equipment:   Intra-op Plan:   Post-operative Plan:   Informed Consent: I have reviewed the patients History and Physical, chart, labs and discussed the procedure including the risks, benefits and alternatives for the proposed anesthesia with the patient or authorized representative who has indicated his/her  understanding and acceptance.     Dental Advisory Given  Plan Discussed with: Anesthesiologist and CRNA  Anesthesia Plan Comments:        Anesthesia Quick Evaluation

## 2018-09-29 NOTE — Telephone Encounter (Signed)
Pt will discuss this at appt 08/25.

## 2018-09-29 NOTE — Telephone Encounter (Signed)
Copied from [place] [id number]. Topic: General - Inquiry >> Sep 28, 2018  3:20 PM Percell Belt A wrote: Reason for CRM: pt called in an states she spoke with nurse yesterday an mentioned she had thrush and need a med call in for it .  She call back to check the [place] in [employer] number  [phone]

## 2018-09-29 NOTE — Transfer of Care (Signed)
Immediate Anesthesia Transfer of Care Note  Patient: Diane Pearson  Procedure(s) Performed: COLONOSCOPY WITH PROPOFOL (N/A )  Patient Location: PACU and Endoscopy Unit  Anesthesia Type:General  Level of Consciousness: awake, drowsy and patient cooperative  Airway & Oxygen Therapy: Patient Spontanous Breathing  Post-op Assessment: Report given to RN  Post vital signs: Reviewed and stable  Last Vitals:  Vitals Value Taken Time  BP 143/86 09/29/18 1404  Temp    Pulse 66 09/29/18 1405  Resp 12 09/29/18 1405  SpO2 100 % 09/29/18 1405  Vitals shown include unvalidated device data.  Last Pain:  Vitals:   09/29/18 0931  TempSrc: Tympanic         Complications: No apparent anesthesia complications

## 2018-10-02 ENCOUNTER — Encounter: Payer: Self-pay | Admitting: Gastroenterology

## 2018-10-02 LAB — SURGICAL PATHOLOGY

## 2018-10-02 NOTE — Anesthesia Postprocedure Evaluation (Signed)
Anesthesia Post Note  Patient: Diane Pearson  Procedure(s) Performed: COLONOSCOPY WITH PROPOFOL (N/A )  Patient location during evaluation: PACU Anesthesia Type: General Level of consciousness: awake and alert Pain management: pain level controlled Vital Signs Assessment: post-procedure vital signs reviewed and stable Respiratory status: spontaneous breathing, nonlabored ventilation and respiratory function stable Cardiovascular status: blood pressure returned to baseline and stable Postop Assessment: no apparent nausea or vomiting Anesthetic complications: no     Last Vitals:  Vitals:   09/29/18 1419 09/29/18 1420  BP: 137/85 (!) 144/77  Pulse: 72 79  Resp: 16 15  Temp:    SpO2: 96% 94%    Last Pain:  Vitals:   09/29/18 1407  TempSrc: Tympanic                 Durenda Hurt

## 2018-10-03 ENCOUNTER — Encounter: Payer: Self-pay | Admitting: Family Medicine

## 2018-10-03 ENCOUNTER — Other Ambulatory Visit: Payer: Self-pay

## 2018-10-03 ENCOUNTER — Ambulatory Visit (INDEPENDENT_AMBULATORY_CARE_PROVIDER_SITE_OTHER): Payer: Medicare Other | Admitting: Family Medicine

## 2018-10-03 VITALS — BP 135/88 | HR 67 | Temp 98.8°F | Ht 66.0 in | Wt 168.0 lb

## 2018-10-03 DIAGNOSIS — F458 Other somatoform disorders: Secondary | ICD-10-CM | POA: Diagnosis not present

## 2018-10-03 DIAGNOSIS — Z0000 Encounter for general adult medical examination without abnormal findings: Secondary | ICD-10-CM | POA: Diagnosis not present

## 2018-10-03 DIAGNOSIS — B370 Candidal stomatitis: Secondary | ICD-10-CM

## 2018-10-03 MED ORDER — CYCLOBENZAPRINE HCL 10 MG PO TABS: ORAL_TABLET | ORAL | 0 refills | Status: DC

## 2018-10-03 MED ORDER — NYSTATIN 100000 UNIT/ML MT SUSP: 5.0000 mL | Freq: Four times a day (QID) | OROMUCOSAL | 0 refills | Status: DC

## 2018-10-03 NOTE — Patient Instructions (Addendum)
Health Maintenance, Female Adopting a healthy lifestyle and getting preventive care are important in promoting health and wellness. Ask your health care provider about:  The right schedule for you to have regular tests and exams.  Things you can do on your own to prevent diseases and keep yourself healthy. What should I know about diet, weight, and exercise? Eat a healthy diet   Eat a diet that includes plenty of vegetables, fruits, low-fat dairy products, and lean protein.  Do not eat a lot of foods that are high in solid fats, added sugars, or sodium. Maintain a healthy weight Body mass index (BMI) is used to identify weight problems. It estimates body fat based on height and weight. Your health care provider can help determine your BMI and help you achieve or maintain a healthy weight. Get regular exercise Get regular exercise. This is one of the most important things you can do for your health. Most adults should:  Exercise for at least 150 minutes each week. The exercise should increase your heart rate and make you sweat (moderate-intensity exercise).  Do strengthening exercises at least twice a week. This is in addition to the moderate-intensity exercise.  Spend less time sitting. Even light physical activity can be beneficial. Watch cholesterol and blood lipids Have your blood tested for lipids and cholesterol at 62 years of age, then have this test every 5 years. Have your cholesterol levels checked more often if:  Your lipid or cholesterol levels are high.  You are older than 62 years of age.  You are at high risk for heart disease. What should I know about cancer screening? Depending on your health history and family history, you may need to have cancer screening at various ages. This may include screening for:  Breast cancer.  Cervical cancer.  Colorectal cancer.  Skin cancer.  Lung cancer. What should I know about heart disease, diabetes, and high blood  pressure? Blood pressure and heart disease  High blood pressure causes heart disease and increases the risk of stroke. This is more likely to develop in people who have high blood pressure readings, are of African descent, or are overweight.  Have your blood pressure checked: ? Every 3-5 years if you are 18-39 years of age. ? Every year if you are 40 years old or older. Diabetes Have regular diabetes screenings. This checks your fasting blood sugar level. Have the screening done:  Once every three years after age 40 if you are at a normal weight and have a low risk for diabetes.  More often and at a younger age if you are overweight or have a high risk for diabetes. What should I know about preventing infection? Hepatitis B If you have a higher risk for hepatitis B, you should be screened for this virus. Talk with your health care provider to find out if you are at risk for hepatitis B infection. Hepatitis C Testing is recommended for:  Everyone born from 1945 through 1965.  Anyone with known risk factors for hepatitis C. Sexually transmitted infections (STIs)  Get screened for STIs, including gonorrhea and chlamydia, if: ? You are sexually active and are younger than 62 years of age. ? You are older than 62 years of age and your health care provider tells you that you are at 62 risk for this type of infection. ? Your sexual activity has changed since you were last screened, and you are at increased risk for chlamydia or gonorrhea. Ask your health care provider if   you are at risk.  Ask your health care provider about whether you are at high risk for HIV. Your health care provider may recommend a prescription medicine to help prevent HIV infection. If you choose to take medicine to prevent HIV, you should first get tested for HIV. You should then be tested every 3 months for as long as you are taking the medicine. Pregnancy  If you are about to stop having your period (premenopausal) and  you may become pregnant, seek counseling before you get pregnant.  Take 400 to 800 micrograms (mcg) of folic acid every day if you become pregnant.  Ask for birth control (contraception) if you want to prevent pregnancy. Osteoporosis and menopause Osteoporosis is a disease in which the bones lose minerals and strength with aging. This can result in bone fractures. If you are 62 years old or older, or if you are at risk for osteoporosis and fractures, ask your health care provider if you should:  Be screened for bone loss.  Take a calcium or vitamin D supplement to lower your risk of fractures.  Be given hormone replacement therapy (HRT) to treat symptoms of menopause. Follow these instructions at home: Lifestyle  Do not use any products that contain nicotine or tobacco, such as cigarettes, e-cigarettes, and chewing tobacco. If you need help quitting, ask your health care provider.  Do not use street drugs.  Do not share needles.  Ask your health care provider for help if you need support or information about quitting drugs. Alcohol use  Do not drink alcohol if: ? Your health care provider tells you not to drink. ? You are pregnant, may be pregnant, or are planning to become pregnant.  If you drink alcohol: ? Limit how much you use to 0-1 drink a day. ? Limit intake if you are breastfeeding.  Be aware of how much alcohol is in your drink. In the [place] one drink equals one 12 oz bottle of beer (355 mL), one 5 oz glass of wine (148 mL), or one 1 oz glass of hard liquor (44 mL). General instructions  Schedule regular health, dental, and eye exams.  Stay current with your vaccines.  Tell your health care provider if: ? You often feel depressed. ? You have ever been abused or do not feel safe at home. Summary  Adopting a healthy lifestyle and getting preventive care are important in promoting health and wellness.  Follow your health care provider's instructions about healthy  diet, exercising, and getting tested or screened for diseases.  Follow your health care provider's instructions on monitoring your cholesterol and blood pressure. This information is not intended to replace advice given to you by your health care provider. Make sure you discuss any questions you have with your health care provider. Document Released: 08/10/2010 Document Revised: 01/18/2018 Document Reviewed: 01/18/2018 Elsevier Patient Education  2020 [employer].  Seborrheic Keratosis A seborrheic keratosis is a common, noncancerous (benign) skin growth. These growths are velvety, waxy, rough, tan, brown, or black spots that appear on the skin. These skin growths can be flat or raised, and scaly. What are the causes? The cause of this condition is not known. What increases the risk? You are more likely to develop this condition if you:  Have a family history of seborrheic keratosis.  Are 50 or older.  Are pregnant.  Have had estrogen replacement therapy. What are the signs or symptoms? Symptoms of this condition include growths on the face, chest, shoulders, back, or  other areas. These growths:  Are usually painless, but may become irritated and itchy.  Can be yellow, brown, black, or other colors.  Are slightly raised or have a flat surface.  Are sometimes rough or wart-like in texture.  Are often velvety or waxy on the surface.  Are round or oval-shaped.  Often occur in groups, but may occur as a single growth. How is this diagnosed? This condition is diagnosed with a medical history and physical exam.  A sample of the growth may be tested (skin biopsy).  You may need to see a skin specialist (dermatologist). How is this treated? Treatment is not usually needed for this condition, unless the growths are irritated or bleed often.  You may also choose to have the growths removed if you do not like their appearance. ? Most commonly, these growths are treated with a  procedure in which liquid nitrogen is applied to "freeze" off the growth (cryosurgery). ? They may also be burned off with electricity (electrocautery) or removed by scraping (curettage). Follow these instructions at home:  Watch your growth for any changes.  Keep all follow-up visits as told by your health care provider. This is important.  Do not scratch or pick at the growth or growths. This can cause them to become irritated or infected. Contact a health care provider if:  You suddenly have many new growths.  Your growth bleeds, itches, or hurts.  Your growth suddenly becomes larger or changes color. Summary  A seborrheic keratosis is a common, noncancerous (benign) skin growth.  Treatment is not usually needed for this condition, unless the growths are irritated or bleed often.  Watch your growth for any changes.  Contact a health care provider if you suddenly have many new growths or your growth suddenly becomes larger or changes color.  Keep all follow-up visits as told by your health care provider. This is important. This information is not intended to replace advice given to you by your health care provider. Make sure you discuss any questions you have with your health care provider. Document Released: 02/27/2010 Document Revised: 06/09/2017 Document Reviewed: 06/09/2017 Elsevier Patient Education  2020 [employer].

## 2018-10-03 NOTE — Progress Notes (Signed)
BP 135/88    Pulse 67    Temp 98.8 F (37.1 C) (Oral)    Ht 5\' 6"  (1.676 m)    Wt 168 lb (76.2 kg)    LMP 09/28/2008 (Approximate)    SpO2 96%    BMI 27.12 kg/m    Subjective:    Patient ID: Diane Pearson, female    DOB: 03-11-1955, 63 y.o.   MRN: [id number]  HPI: Diane Pearson is a 63 y.o. female presenting on 10/03/2018 for comprehensive medical examination. Current medical complaints include:  Has been having some white film and some blisters on her tongue for about the past week or so. She had a diflucan at home that she took, but it has never totally gone away. Has been having some headaches. Clenches and grinds her teeth most days. She notes that it started after she had some teeth removed and now her teeth don't match up correctly. She has aching in her temples that is worse in the AM when she wakes up. Pain is chronic. Nothing makes it worse. She is otherwise feeling well with no other concerns or complaints at this time.   Menopausal Symptoms: yes  Depression Screen done today and results listed below:  Depression screen [facility] 2/9 10/03/2018 09/27/2018 06/13/2018 11/29/2017 05/30/2017  Decreased Interest 2 0 0 1 0  Down, Depressed, Hopeless 0 0 0 1 0  PHQ - 2 Score 2 0 0 2 0  Altered sleeping 3 - 2 2 3   Tired, decreased energy 3 - 2 2 3   Change in appetite 0 - 0 0 0  Feeling bad or failure about yourself  0 - 0 0 0  Trouble concentrating 3 - 1 3 3   Moving slowly or fidgety/restless 0 - 0 3 2  Suicidal thoughts 0 - 0 0 0  PHQ-9 Score 11 - 5 12 11   Difficult doing work/chores Somewhat difficult - Not difficult at all - Very difficult    Past Medical History:  Past Medical History:  Diagnosis Date   Arthritis    CAD (coronary artery disease)    Diagnosed on CT scan   Depression    Depression    Hypothyroidism    Insomnia    Menopause    Osteoporosis    Tremors of nervous system     Surgical History:  Past Surgical History:  Procedure Laterality Date    BREAST BIOPSY Left 04/20/2016   COLUMNAR CELL CHANGE   COLONOSCOPY WITH PROPOFOL N/A 09/29/2018   Procedure: COLONOSCOPY WITH PROPOFOL;  Surgeon: [name], MD;  Location: [facility] ENDOSCOPY;  Service: Endoscopy;  Laterality: N/A;   varicose veins  1998    Medications:  Current Outpatient Medications on File Prior to Visit  Medication Sig   calcium carbonate (OS-CAL - DOSED IN MG OF ELEMENTAL CALCIUM) 1250 (500 Ca) MG tablet Take 1 tablet by mouth.   cholecalciferol (VITAMIN D) 1000 UNITS tablet Take 1,000 Units by mouth daily.   diclofenac sodium (VOLTAREN) 1 % GEL Apply 2 g topically 4 (four) times daily.   DULoxetine (CYMBALTA) 60 MG capsule Take 1 capsule (60 mg total) by mouth daily.   estrogen, conjugated,-medroxyprogesterone (PREMPRO) 0.625-2.5 MG tablet Take 1 tablet by mouth daily.   famotidine (PEPCID) 10 MG tablet Take 10 mg by mouth at bedtime.   FIBER ADULT GUMMIES PO Take by mouth.   hydrOXYzine (ATARAX/VISTARIL) 25 MG tablet Take 1 tablet (25 mg total) by mouth daily.   ibuprofen (ADVIL,MOTRIN) 800  MG tablet Take 800 mg by mouth at bedtime as needed. Takes bid   levothyroxine (SYNTHROID) 50 MCG tablet TAKE ONE TABLET BY MOUTH EVERY MORNING 30 MINUTES BEFORE BREAKFAST   lidocaine (XYLOCAINE) 5 % ointment Apply 1 application topically 3 (three) times daily as needed.    Magnesium 400 MG TABS Take 400 mg by mouth daily.   metoprolol succinate (TOPROL-XL) 50 MG 24 hr tablet Take 1 tablet (50 mg total) by mouth daily. Take with or immediately following a meal.   Multiple Vitamins-Minerals (CENTRUM SILVER ADULT 50+ PO) Take 1 tablet by mouth daily.   Potassium 99 MG TABS Take 99 mg by mouth daily.   pregabalin (LYRICA) 100 MG capsule Take 1 capsule (100 mg total) by mouth 2 (two) times daily.   vitamin B-12 (CYANOCOBALAMIN) 500 MCG tablet Take 500 mcg by mouth daily.   VITAMIN E PO Take by mouth daily.   zolpidem (AMBIEN) 10 MG tablet Take 1 tablet (10  mg total) by mouth at bedtime.   No current facility-administered medications on file prior to visit.     Allergies:  Allergies  Allergen Reactions   Erythromycin Other (See Comments)    cramps   Gabapentin Other (See Comments)    tremors   Tamiflu [Oseltamivir Phosphate] Other (See Comments)    Cramps and stomach issues    Tramadol     headaches   Nitrofurantoin Diarrhea and Nausea And Vomiting    Social History:  Social History   Socioeconomic History   Marital status: Divorced    Spouse name: Not on file   Number of children: 1   Years of education: Not on file   Highest education level: 12th grade  Occupational History   Occupation: disability   Scientist, product/process development strain: Not hard at all   Food insecurity    Worry: Never true    Inability: Never true   Transportation needs    Medical: No    Non-medical: No  Tobacco Use   Smoking status: Current Every Day Smoker    Packs/day: 1.00    Years: 45.00    Pack years: 45.00    Types: Cigarettes   Smokeless tobacco: Never Used  Substance and Sexual Activity   Alcohol use: No    Alcohol/week: 0.0 standard drinks   Drug use: No   Sexual activity: Yes  Lifestyle   Physical activity    Days per week: 7 days    Minutes per session: 30 min   Stress: Not at all  Relationships   Social connections    Talks on phone: More than three times a week    Gets together: Never    Attends religious service: Never    Active member of club or organization: No    Attends meetings of clubs or organizations: Never    Relationship status: Divorced   Intimate partner violence    Fear of current or ex partner: No    Emotionally abused: No    Physically abused: No    Forced sexual activity: No  Other Topics Concern   Not on file  Social History Narrative   Not on file   Social History   Tobacco Use  Smoking Status Current Every Day Smoker   Packs/day: 1.00   Years: 45.00   Pack  years: 45.00   Types: Cigarettes  Smokeless Tobacco Never Used   Social History   Substance and Sexual Activity  Alcohol Use No   Alcohol/week:  0.0 standard drinks    Family History:  Family History  Problem Relation Age of Onset   Cancer Father        Lung   Tuberculosis Father    Breast cancer Maternal Grandmother 70   Ulcerative colitis Sister    Anxiety disorder Daughter    Alzheimer's disease Maternal Grandfather    Heart disease Paternal Grandmother        MI   Heart disease Paternal Grandfather        MI   Breast cancer Maternal Aunt        70's    Past medical history, surgical history, medications, allergies, family history and social history reviewed with patient today and changes made to appropriate areas of the chart.   Review of Systems  Constitutional: Positive for diaphoresis. Negative for chills, fever, malaise/fatigue and weight loss.  HENT: Positive for sore throat. Negative for congestion, ear discharge, ear pain, hearing loss, nosebleeds, sinus pain and tinnitus.   Eyes: Positive for blurred vision (follows with her eye doctor). Negative for double vision, photophobia, pain, discharge and redness.  Respiratory: Negative.  Negative for stridor.   Cardiovascular: Positive for leg swelling. Negative for chest pain, palpitations, orthopnea, claudication and PND.  Gastrointestinal: Positive for abdominal pain and heartburn (doing well with famotidine). Negative for blood in stool, constipation, diarrhea, melena, nausea and vomiting.  Genitourinary: Negative.   Musculoskeletal: Positive for joint pain (has been having increased pain in her L knee- does well with a brace ) and myalgias. Negative for back pain, falls and neck pain.  Skin: Positive for rash (following with dermatology for eczema). Negative for itching.  Neurological: Positive for dizziness (at random), tingling (in her feet at night) and headaches. Negative for tremors, sensory change,  speech change, focal weakness, seizures, loss of consciousness and weakness.  Endo/Heme/Allergies: Positive for polydipsia. Negative for environmental allergies. Bruises/bleeds easily.  Psychiatric/Behavioral: Positive for depression. Negative for hallucinations, memory loss, substance abuse and suicidal ideas. The patient is not nervous/anxious and does not have insomnia.     All other ROS negative except what is listed above and in the HPI.      Objective:    BP 135/88    Pulse 67    Temp 98.8 F (37.1 C) (Oral)    Ht 5\' 6"  (1.676 m)    Wt 168 lb (76.2 kg)    LMP 09/28/2008 (Approximate)    SpO2 96%    BMI 27.12 kg/m   Wt Readings from Last 3 Encounters:  10/03/18 168 lb (76.2 kg)  09/29/18 164 lb (74.4 kg)  09/27/18 165 lb (74.8 kg)    Physical Exam Vitals signs and nursing note reviewed.  Constitutional:      General: She is not in acute distress.    Appearance: Normal appearance. She is not ill-appearing, toxic-appearing or diaphoretic.  HENT:     Head: Normocephalic and atraumatic.     Right Ear: Tympanic membrane, ear canal and external ear normal. There is no impacted cerumen.     Left Ear: Tympanic membrane, ear canal and external ear normal. There is no impacted cerumen.     Nose: Nose normal. No congestion or rhinorrhea.     Mouth/Throat:     Mouth: Mucous membranes are moist.     Pharynx: Oropharynx is clear. No oropharyngeal exudate or posterior oropharyngeal erythema.     Comments: Mild white film on her tongue, enlarged tastebuds at the back of her tongue Eyes:  General: No scleral icterus.       Right eye: No discharge.        Left eye: No discharge.     Extraocular Movements: Extraocular movements intact.     Conjunctiva/sclera: Conjunctivae normal.     Pupils: Pupils are equal, round, and reactive to light.  Neck:     Musculoskeletal: Normal range of motion and neck supple. No neck rigidity or muscular tenderness.     Vascular: No carotid bruit.    Cardiovascular:     Rate and Rhythm: Normal rate and regular rhythm.     Pulses: Normal pulses.     Heart sounds: No murmur. No friction rub. No gallop.   Pulmonary:     Effort: Pulmonary effort is normal. No respiratory distress.     Breath sounds: Normal breath sounds. No stridor. No wheezing, rhonchi or rales.  Chest:     Chest wall: No tenderness.  Abdominal:     General: Abdomen is flat. Bowel sounds are normal. There is no distension.     Palpations: Abdomen is soft. There is no mass.     Tenderness: There is no abdominal tenderness. There is no right CVA tenderness, left CVA tenderness, guarding or rebound.     Hernia: No hernia is present.  Genitourinary:    Comments: Breast and pelvic exams deferred with shared decision making Musculoskeletal:        General: No swelling, tenderness, deformity or signs of injury.     Right lower leg: No edema.     Left lower leg: No edema.  Lymphadenopathy:     Cervical: No cervical adenopathy.  Skin:    General: Skin is warm and dry.     Capillary Refill: Capillary refill takes less than 2 seconds.     Coloration: Skin is not jaundiced or pale.     Findings: No bruising, erythema, lesion or rash.  Neurological:     General: No focal deficit present.     Mental Status: She is alert and oriented to person, place, and time. Mental status is at baseline.     Cranial Nerves: No cranial nerve deficit.     Sensory: No sensory deficit.     Motor: No weakness.     Coordination: Coordination normal.     Gait: Gait normal.     Deep Tendon Reflexes: Reflexes normal.  Psychiatric:        Mood and Affect: Mood normal.        Behavior: Behavior normal.        Thought Content: Thought content normal.        Judgment: Judgment normal.     Results for orders placed or performed during the hospital encounter of 09/29/18  Surgical pathology  Result Value Ref Range   SURGICAL PATHOLOGY      Surgical Pathology CASE: [id number] PATIENT:  [name] Surgical Pathology Report     SPECIMEN SUBMITTED: A. Colon polyp, transverse; cold snare, rectum polyp; cbx  CLINICAL HISTORY: None provided  PRE-OPERATIVE DIAGNOSIS: Screening colonoscopy  POST-OPERATIVE DIAGNOSIS: Colon polyps     DIAGNOSIS: A. COLON POLYP, TRANSVERSE AND RECTUM; COLD SNARE, COLD BIOPSY: - TUBULAR ADENOMA, ONE FRAGMENT. - HYPERPLASTIC POLYP, ONE FRAGMENT. - NEGATIVE FOR HIGH-GRADE DYSPLASIA AND MALIGNANCY.   GROSS DESCRIPTION: A. Labeled: Transverse colon polyp cold snare x1, rectum polyp C BX x1 Received: In formalin Tissue fragment(s): 2 Size: 0.3 and 0.4 cm Description: Tan soft tissue fragments Entirely submitted in 1 cassette.   Final Diagnosis  performed by Betsy Pries, MD.   Electronically signed 10/02/2018 9:40:04AM The electronic signature indicates that the named Attending Pathologist has evaluated the specimen  Technical component performed at [facility],  [address] [place], [place] [address] Lab: [phone] Dir: Rush Farmer, MD, MMM  Professional component performed at [facility], [facility], [place], [place], [place] [address] Lab: [phone] Dir: Dellia Nims. Reuel Derby, MD       Assessment & Plan:   Problem List Items Addressed This Visit    None    Visit Diagnoses    Routine general medical examination at a health care facility    -  Primary   Vaccines up to date/declined. Screening labs checked last visit. Pap up to date. Mammogram and colonoscopy up to date. Continue diet and exercise.    Thrush       Mild. Will treat with nystatin mouth wash. Call if not improving.    Relevant Medications   nystatin (MYCOSTATIN) 100000 UNIT/ML suspension   Bruxism       Continue flexeril- refill given. Follow up with dentist. Call if not improving or worsening.        Follow up plan: Return in about 3 months (around 01/03/2019) for 6 month follow up.   LABORATORY TESTING:  - Pap smear: up  to date  IMMUNIZATIONS:   - Tdap: Tetanus vaccination status reviewed: last tetanus booster within 10 years. - Influenza: Refused - Pneumovax: Up to date  SCREENING: -Mammogram: Up to date  - Colonoscopy: Up to date  - Bone Density: Not applicable   PATIENT COUNSELING:   Advised to take 1 mg of folate supplement per day if capable of pregnancy.   Sexuality: Discussed sexually transmitted diseases, partner selection, use of condoms, avoidance of unintended pregnancy  and contraceptive alternatives.   Advised to avoid cigarette smoking.  I discussed with the patient that most people either abstain from alcohol or drink within safe limits (<=14/week and <=4 drinks/occasion for males, <=7/weeks and <= 3 drinks/occasion for females) and that the risk for alcohol disorders and other health effects rises proportionally with the number of drinks per week and how often a drinker exceeds daily limits.  Discussed cessation/primary prevention of drug use and availability of treatment for abuse.   Diet: Encouraged to adjust caloric intake to maintain  or achieve ideal body weight, to reduce intake of dietary saturated fat and total fat, to limit sodium intake by avoiding high sodium foods and not adding table salt, and to maintain adequate dietary potassium and calcium preferably from fresh fruits, vegetables, and low-fat dairy products.    stressed the importance of regular exercise  Injury prevention: Discussed safety belts, safety helmets, smoke detector, smoking near bedding or upholstery.   Dental health: Discussed importance of regular tooth brushing, flossing, and dental visits.    NEXT PREVENTATIVE PHYSICAL DUE IN 1 YEAR. Return in about 3 months (around 01/03/2019) for 6 month follow up.

## 2018-10-17 ENCOUNTER — Encounter: Payer: Self-pay | Admitting: Family Medicine

## 2018-10-23 ENCOUNTER — Ambulatory Visit (INDEPENDENT_AMBULATORY_CARE_PROVIDER_SITE_OTHER): Payer: Medicare Other | Admitting: Family Medicine

## 2018-10-23 ENCOUNTER — Encounter: Payer: Self-pay | Admitting: Family Medicine

## 2018-10-23 ENCOUNTER — Other Ambulatory Visit: Payer: Self-pay

## 2018-10-23 VITALS — Temp 97.8°F | Ht 66.0 in | Wt 168.0 lb

## 2018-10-23 DIAGNOSIS — F332 Major depressive disorder, recurrent severe without psychotic features: Secondary | ICD-10-CM | POA: Diagnosis not present

## 2018-10-23 DIAGNOSIS — F458 Other somatoform disorders: Secondary | ICD-10-CM

## 2018-10-23 DIAGNOSIS — M25562 Pain in left knee: Secondary | ICD-10-CM | POA: Diagnosis not present

## 2018-10-23 DIAGNOSIS — G250 Essential tremor: Secondary | ICD-10-CM | POA: Diagnosis not present

## 2018-10-23 DIAGNOSIS — G8929 Other chronic pain: Secondary | ICD-10-CM | POA: Diagnosis not present

## 2018-10-23 NOTE — Progress Notes (Signed)
Temp 97.8 F (36.6 C)   Ht 5\' 6"  (1.676 m)   Wt 168 lb (76.2 kg)   LMP 09/28/2008 (Approximate)   BMI 27.12 kg/m    Subjective:    Patient ID: Diane Pearson, female    DOB: [birth year], 63 y.o.   MRN: [id number]  HPI: Diane Pearson is a 63 y.o. female  Chief Complaint  Patient presents with  . Other    Raised bumps on back of tongue and yellow film over tongue, no pain at this time. Teeth grinding  . Fatigue  . Shaking   Has not been feeling well. Has been feeling really crappy. Feels like she did when she first got fibromyalgia.   KNEE PAIN Duration: chronic Involved knee: bilateral L>R Mechanism of injury: unknown Location:diffuse Onset: gradual Severity: severe  Quality:  Aching and sore Frequency: constant Radiation: no Aggravating factors: weight bearing, walking and running  Alleviating factors: brace  Status: worse Treatments attempted: rest, ice, heat, APAP, ibuprofen and aleve  Relief with NSAIDs?:  mild Weakness with weight bearing or walking: yes Sensation of giving way: yes Locking: yes Popping: yes Bruising: yes Swelling: yes Redness: yes Paresthesias/decreased sensation: yes Fevers: yes   Has been having some more shaking especially when she's doing things  Having bruxism. Did not follow up with dentistry. Was taking her cyclobenzaprine, but didn't feel like it was particularly helpful. Has been having headaches  ANXIETY/STRESS Duration:exacerbated Anxious mood: yes  Excessive worrying: yes Irritability: no  Sweating: no Nausea: no Palpitations:no Hyperventilation: no Panic attacks: no Agoraphobia: yes  Obscessions/compulsions: yes Depressed mood: yes Depression screen [facility] 2/9 10/23/2018 10/03/2018 09/27/2018 06/13/2018 11/29/2017  Decreased Interest 2 2 0 0 1  Down, Depressed, Hopeless 2 0 0 0 1  PHQ - 2 Score 4 2 0 0 2  Altered sleeping 2 3 - 2 2  Tired, decreased energy 2 3 - 2 2  Change in appetite 0 0 - 0 0  Feeling bad or  failure about yourself  0 0 - 0 0  Trouble concentrating 3 3 - 1 3  Moving slowly or fidgety/restless 0 0 - 0 3  Suicidal thoughts 0 0 - 0 0  PHQ-9 Score 11 11 - 5 12  Difficult doing work/chores Very difficult Somewhat difficult - Not difficult at all -   GAD 7 : Generalized Anxiety Score 10/23/2018 10/03/2018  Nervous, Anxious, on Edge 2 3  Control/stop worrying 0 0  Worry too much - different things 0 0  Trouble relaxing 2 0  Restless 2 3  Easily annoyed or irritable 0 0  Afraid - awful might happen 0 0  Total GAD 7 Score 6 6  Anxiety Difficulty Somewhat difficult Somewhat difficult   Anhedonia: no Weight changes: no Insomnia: yes hard to fall asleep  Hypersomnia: no Fatigue/loss of energy: yes Feelings of worthlessness: yes Feelings of guilt: yes Impaired concentration/indecisiveness: yes Suicidal ideations: no  Crying spells: yes Recent Stressors/Life Changes: yes   Relationship problems: no   Family stress: no     Financial stress: no    Job stress: no    Recent death/loss: no    Relevant past medical, surgical, family and social history reviewed and updated as indicated. Interim medical history since our last visit reviewed. Allergies and medications reviewed and updated.  Review of Systems  Constitutional: Negative.   Respiratory: Negative.   Cardiovascular: Negative.   Musculoskeletal: Positive for arthralgias, gait problem, joint swelling and myalgias. Negative for back  pain, neck pain and neck stiffness.  Skin: Negative.   Neurological: Positive for tremors. Negative for dizziness, seizures, syncope, facial asymmetry, speech difficulty, weakness, light-headedness, numbness and headaches.  Psychiatric/Behavioral: Positive for dysphoric mood and sleep disturbance. Negative for agitation, behavioral problems, confusion, decreased concentration, hallucinations, self-injury and suicidal ideas. The patient is nervous/anxious. The patient is not hyperactive.      Per HPI unless specifically indicated above     Objective:    Temp 97.8 F (36.6 C)   Ht 5\' 6"  (1.676 m)   Wt 168 lb (76.2 kg)   LMP 09/28/2008 (Approximate)   BMI 27.12 kg/m   Wt Readings from Last 3 Encounters:  10/23/18 168 lb (76.2 kg)  10/03/18 168 lb (76.2 kg)  09/29/18 164 lb (74.4 kg)    Physical Exam Vitals signs and nursing note reviewed.  Constitutional:      General: She is not in acute distress.    Appearance: Normal appearance. She is not ill-appearing, toxic-appearing or diaphoretic.  HENT:     Head: Normocephalic and atraumatic.     Right Ear: External ear normal.     Left Ear: External ear normal.     Nose: Nose normal.     Mouth/Throat:     Mouth: Mucous membranes are moist.     Pharynx: Oropharynx is clear.  Eyes:     General: No scleral icterus.       Right eye: No discharge.        Left eye: No discharge.     Conjunctiva/sclera: Conjunctivae normal.     Pupils: Pupils are equal, round, and reactive to light.  Neck:     Musculoskeletal: Normal range of motion.  Pulmonary:     Effort: Pulmonary effort is normal. No respiratory distress.     Comments: Speaking in full sentences Musculoskeletal: Normal range of motion.  Skin:    Coloration: Skin is not jaundiced or pale.     Findings: No bruising, erythema, lesion or rash.  Neurological:     Mental Status: She is alert and oriented to person, place, and time. Mental status is at baseline.  Psychiatric:        Mood and Affect: Mood normal.        Behavior: Behavior normal.        Thought Content: Thought content normal.        Judgment: Judgment normal.     Results for orders placed or performed during the hospital encounter of 09/29/18  Surgical pathology  Result Value Ref Range   SURGICAL PATHOLOGY      Surgical Pathology CASE: [id number] PATIENT: [name] Surgical Pathology Report     SPECIMEN SUBMITTED: A. Colon polyp, transverse; cold snare, rectum polyp; cbx  CLINICAL  HISTORY: None provided  PRE-OPERATIVE DIAGNOSIS: Screening colonoscopy  POST-OPERATIVE DIAGNOSIS: Colon polyps     DIAGNOSIS: A. COLON POLYP, TRANSVERSE AND RECTUM; COLD SNARE, COLD BIOPSY: - TUBULAR ADENOMA, ONE FRAGMENT. - HYPERPLASTIC POLYP, ONE FRAGMENT. - NEGATIVE FOR HIGH-GRADE DYSPLASIA AND MALIGNANCY.   GROSS DESCRIPTION: A. Labeled: Transverse colon polyp cold snare x1, rectum polyp C BX x1 Received: In formalin Tissue fragment(s): 2 Size: 0.3 and 0.4 cm Description: Tan soft tissue fragments Entirely submitted in 1 cassette.   Final Diagnosis performed by Betsy Pries, MD.   Electronically signed 10/02/2018 9:40:04AM The electronic signature indicates that the named Attending Pathologist has evaluated the specimen  Technical component performed at [facility],  [address], [place], [place] [address] Lab: [phone] Dir: Derinda Late  Perlie Gold, MD, MMM  Professional component performed at [facility], [facility], [place], [place], [place] [address] Lab: [phone] Dir: Dellia Nims. Reuel Derby, MD       Assessment & Plan:   Problem List Items Addressed This Visit      Nervous and Auditory   Benign essential tremor    Worsening. Will get her into neurology. Call with any concerns. Continue to monitor.       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 She thinks that this is due to her pain and due to her shaking- we will get her into neurology. If not getting better or getting worse, we will adjust medicine. Continue to monitor. Call with any concerns.        Other Visit Diagnoses    Chronic pain of left knee    -  Primary   Encouraged her to follow up with orthopedics. She will call them.    Bruxism       Encouraged patient to follow up with dentistry. Continue flexeril. Continue to monitor.        Follow up plan: Return in about 4 weeks (around 11/20/2018).    .  This visit was completed via FaceTime due to the restrictions of the COVID-19 pandemic. All issues as above were discussed and addressed. Physical exam was done as above through visual confirmation on FaceTime. If it was felt that the patient should be evaluated in the office, they were directed there. The patient verbally consented to this visit. . Location of the patient: home . Location of the provider: work . Those involved with this call:  . Provider: Park Liter, DO . CMA: Tiffany Reel, CMA . Front Desk/Registration: Don Perking  . Time spent on call: 25 minutes with patient face to face via video conference. More than 50% of this time was spent in counseling and coordination of care. 40 minutes total spent in review of patient's record and preparation of their chart.

## 2018-10-23 NOTE — Patient Instructions (Signed)

## 2018-10-29 NOTE — Assessment & Plan Note (Signed)
Worsening. Will get her into neurology. Call with any concerns. Continue to monitor.

## 2018-10-29 NOTE — Assessment & Plan Note (Signed)
Acting up. She thinks that this is due to her pain and due to her shaking- we will get her into neurology. If not getting better or getting worse, we will adjust medicine. Continue to monitor. Call with any concerns.

## 2018-10-31 DIAGNOSIS — S86919A Strain of unspecified muscle(s) and tendon(s) at lower leg level, unspecified leg, initial encounter: Secondary | ICD-10-CM | POA: Insufficient documentation

## 2018-10-31 DIAGNOSIS — M1712 Unilateral primary osteoarthritis, left knee: Secondary | ICD-10-CM | POA: Diagnosis not present

## 2018-11-06 ENCOUNTER — Other Ambulatory Visit: Payer: Self-pay | Admitting: Family Medicine

## 2018-11-20 DIAGNOSIS — S86912A Strain of unspecified muscle(s) and tendon(s) at lower leg level, left leg, initial encounter: Secondary | ICD-10-CM | POA: Diagnosis not present

## 2018-11-22 ENCOUNTER — Telehealth: Payer: Self-pay | Admitting: Family Medicine

## 2018-11-22 DIAGNOSIS — K219 Gastro-esophageal reflux disease without esophagitis: Secondary | ICD-10-CM

## 2018-11-22 NOTE — Telephone Encounter (Signed)
Copied from [place] [id number]. Topic: Referral - Request for Referral >> Nov 22, 2018  3:21 PM Rayann Heman wrote: Has patient seen PCP for this complaint? yes Referral for which specialty: GI  Preferred provider/office: Any  Reason for referral: Acid Reflux/ medication not working

## 2018-11-23 DIAGNOSIS — S83282A Other tear of lateral meniscus, current injury, left knee, initial encounter: Secondary | ICD-10-CM | POA: Diagnosis not present

## 2018-11-28 ENCOUNTER — Other Ambulatory Visit: Payer: Self-pay | Admitting: Family Medicine

## 2018-11-28 NOTE — Telephone Encounter (Signed)
Requested medication (s) are due for refill today: yes  Requested medication (s) are on the active medication list: yes  Last refill:  08/15/2018  Future visit scheduled: yes  Notes to clinic:  Refill cannot be delegated   Requested Prescriptions  Pending Prescriptions Disp Refills   pregabalin (LYRICA) 100 MG capsule [Pharmacy Med Name: PREGABALIN 100 MG CAPSULE] 180 capsule 0    Sig: TAKE ONE CAPSULE BY MOUTH TWICE A DAY     Not Delegated - Neurology:  Anticonvulsants - Controlled Failed - 11/28/2018  1:28 PM      Failed - This refill cannot be delegated      Passed - Valid encounter within last 12 months    Recent Outpatient Visits          1 month ago Chronic pain of left knee   [place], Megan P, DO   1 month ago Routine general medical examination at a health care facility   [facility], [place], DO   3 months ago [place], [place], DO   5 months ago Hypothyroidism, unspecified type   [place], Megan P, DO   12 months ago Hypothyroidism, unspecified type   [facility] [place], Barb Merino, DO      Future Appointments            In 1 month Johnson, Barb Merino, DO [employer] [facility], [facility]   In 7 months McGowan, Hunt Oris, PA-C [facility]           Signed Prescriptions Disp Refills   levothyroxine (SYNTHROID) 50 MCG tablet 90 tablet 0    Sig: TAKE ONE TABLET BY MOUTH EVERY MORNING 21 MINUTES BEFORE BREAKFAST     Endocrinology:  Hypothyroid Agents Failed - 11/28/2018  1:28 PM      Failed - TSH needs to be rechecked within 3 months after an abnormal result. Refill until TSH is due.      Passed - TSH in normal range and within 360 days    TSH  Date Value Ref Range Status  06/22/2018 2.510 0.450 - 4.500 uIU/mL Final         Passed - Valid encounter within last 12 months    Recent Outpatient Visits          1 month ago Chronic  pain of left knee   [place], Megan P, DO   1 month ago Routine general medical examination at a health care facility   [facility], [place], DO   3 months ago [place], [place], DO   5 months ago Hypothyroidism, unspecified type   [facility], Megan P, DO   12 months ago Hypothyroidism, unspecified type   [facility] [place], [place], DO      Future Appointments            In 1 month Johnson, Megan P, DO [facility] [facility]   In 7 months McGowan, Hunt Oris, PA-C [facility]            metoprolol succinate (TOPROL-XL) 50 MG 24 hr tablet 90 tablet 0    Sig: TAKE ONE TABLET BY MOUTH DAILY WITH OR IMMEDIATELY FOLLOWING A MEAL     Cardiovascular:  Beta Blockers Passed - 11/28/2018  1:28 PM      Passed - Last BP in normal  range    BP Readings from Last 1 Encounters:  10/03/18 135/88         Passed - Last Heart Rate in normal range    Pulse Readings from Last 1 Encounters:  10/03/18 67         Passed - Valid encounter within last 6 months    Recent Outpatient Visits          1 month ago Chronic pain of left knee   [facility], Megan P, DO   1 month ago Routine general medical examination at a health care facility   [facility], [place], DO   3 months ago [place], [place], DO   5 months ago Hypothyroidism, unspecified type   [facility], Megan P, DO   12 months ago Hypothyroidism, unspecified type   [place], [place], DO      Future Appointments            In 1 month Johnson, Megan P, DO [employer], [place]   In 7 months McGowan, Hunt Oris, PA-C [facility]            DULoxetine (CYMBALTA) 60 MG capsule 90 capsule 0    Sig: TAKE ONE CAPSULE BY MOUTH DAILY     Psychiatry:  Antidepressants - SNRI Passed - 11/28/2018  1:28 PM      Passed - Last BP in normal range    BP Readings from Last 1 Encounters:  10/03/18 135/88         Passed - Valid encounter within last 6 months    Recent Outpatient Visits          1 month ago Chronic pain of left knee   [place], Megan P, DO   1 month ago Routine general medical examination at a health care facility   [facility], [place], DO   3 months ago [place], [place], DO   5 months ago Hypothyroidism, unspecified type   [place], [place], DO   12 months ago Hypothyroidism, unspecified type   [place], [place], DO      Future Appointments            In 1 month Johnson, Barb Merino, DO [employer] [facility], [place]   In 7 months McGowan, Gordan Payment [place] - Completed PHQ-2 or PHQ-9 in the last 360 days.

## 2018-12-05 ENCOUNTER — Ambulatory Visit: Payer: Medicare Other | Admitting: Neurology

## 2018-12-06 ENCOUNTER — Encounter: Payer: Self-pay | Admitting: Gastroenterology

## 2018-12-06 ENCOUNTER — Other Ambulatory Visit: Payer: Self-pay

## 2018-12-06 ENCOUNTER — Ambulatory Visit (INDEPENDENT_AMBULATORY_CARE_PROVIDER_SITE_OTHER): Payer: Medicare Other | Admitting: Gastroenterology

## 2018-12-06 VITALS — BP 174/112 | HR 82 | Temp 98.1°F | Ht 66.0 in | Wt 170.5 lb

## 2018-12-06 DIAGNOSIS — K219 Gastro-esophageal reflux disease without esophagitis: Secondary | ICD-10-CM | POA: Diagnosis not present

## 2018-12-06 DIAGNOSIS — Z87898 Personal history of other specified conditions: Secondary | ICD-10-CM | POA: Diagnosis not present

## 2018-12-06 NOTE — Patient Instructions (Addendum)
Please Get a Bed Wedge from [place]   Gastroesophageal Reflux Disease, Adult Gastroesophageal reflux (GER) happens when acid from the stomach flows up into the tube that connects the mouth and the stomach (esophagus). Normally, food travels down the esophagus and stays in the stomach to be digested. With GER, food and stomach acid sometimes move back up into the esophagus. You may have a disease called gastroesophageal reflux disease (GERD) if the reflux:  Happens often.  Causes frequent or very bad symptoms.  Causes problems such as damage to the esophagus. When this happens, the esophagus becomes sore and swollen (inflamed). Over time, GERD can make small holes (ulcers) in the lining of the esophagus. What are the causes? This condition is caused by a problem with the muscle between the esophagus and the stomach. When this muscle is weak or not normal, it does not close properly to keep food and acid from coming back up from the stomach. The muscle can be weak because of:  Tobacco use.  Pregnancy.  Having a certain type of hernia (hiatal hernia).  Alcohol use.  Certain foods and drinks, such as coffee, chocolate, onions, and peppermint. What increases the risk? You are more likely to develop this condition if you:  Are overweight.  Have a disease that affects your connective tissue.  Use NSAID medicines. What are the signs or symptoms? Symptoms of this condition include:  Heartburn.  Difficult or painful swallowing.  The feeling of having a lump in the throat.  A bitter taste in the mouth.  Bad breath.  Having a lot of saliva.  Having an upset or bloated stomach.  Belching.  Chest pain. Different conditions can cause chest pain. Make sure you see your doctor if you have chest pain.  Shortness of breath or noisy breathing (wheezing).  Ongoing (chronic) cough or a cough at night.  Wearing away of the surface of teeth (tooth enamel).  Weight loss. How is this  treated? Treatment will depend on how bad your symptoms are. Your doctor may suggest:  Changes to your diet.  Medicine.  Surgery. Follow these instructions at home: Eating and drinking   Follow a diet as told by your doctor. You may need to avoid foods and drinks such as: ? Coffee and tea (with or without caffeine). ? Drinks that contain alcohol. ? Energy drinks and sports drinks. ? Bubbly (carbonated) drinks or sodas. ? Chocolate and cocoa. ? Peppermint and mint flavorings. ? Garlic and onions. ? Horseradish. ? Spicy and acidic foods. These include peppers, chili powder, curry powder, vinegar, hot sauces, and BBQ sauce. ? Citrus fruit juices and citrus fruits, such as oranges, lemons, and limes. ? Tomato-based foods. These include red sauce, chili, salsa, and pizza with red sauce. ? Fried and fatty foods. These include donuts, french fries, potato chips, and high-fat dressings. ? High-fat meats. These include hot dogs, rib eye steak, sausage, ham, and bacon. ? High-fat dairy items, such as whole milk, butter, and cream cheese.  Eat small meals often. Avoid eating large meals.  Avoid drinking large amounts of liquid with your meals.  Avoid eating meals during the 2-3 hours before bedtime.  Avoid lying down right after you eat.  Do not exercise right after you eat. Lifestyle   Do not use any products that contain nicotine or tobacco. These include cigarettes, e-cigarettes, and chewing tobacco. If you need help quitting, ask your doctor.  Try to lower your stress. If you need help doing this, ask your  doctor.  If you are overweight, lose an amount of weight that is healthy for you. Ask your doctor about a safe weight loss goal. General instructions  Pay attention to any changes in your symptoms.  Take over-the-counter and prescription medicines only as told by your doctor. Do not take aspirin, ibuprofen, or other NSAIDs unless your doctor says it is okay.  Wear loose  clothes. Do not wear anything tight around your waist.  Raise (elevate) the head of your bed about 6 inches (15 cm).  Avoid bending over if this makes your symptoms worse.  Keep all follow-up visits as told by your doctor. This is important. Contact a doctor if:  You have new symptoms.  You lose weight and you do not know why.  You have trouble swallowing or it hurts to swallow.  You have wheezing or a cough that keeps happening.  Your symptoms do not get better with treatment.  You have a hoarse voice. Get help right away if:  You have pain in your arms, neck, jaw, teeth, or back.  You feel sweaty, dizzy, or light-headed.  You have chest pain or shortness of breath.  You throw up (vomit) and your throw-up looks like blood or coffee grounds.  You pass out (faint).  Your poop (stool) is bloody or black.  You cannot swallow, drink, or eat. Summary  If a person has gastroesophageal reflux disease (GERD), food and stomach acid move back up into the esophagus and cause symptoms or problems such as damage to the esophagus.  Treatment will depend on how bad your symptoms are.  Follow a diet as told by your doctor.  Take all medicines only as told by your doctor. This information is not intended to replace advice given to you by your health care provider. Make sure you discuss any questions you have with your health care provider. Document Released: 07/14/2007 Document Revised: 08/03/2017 Document Reviewed: 08/03/2017 Elsevier Patient Education  2020 [employer].

## 2018-12-06 NOTE — Progress Notes (Signed)
Diane Pearson, [place] [address]  Main: [phone]  Fax: [phone]   Gastroenterology Consultation  Referring Provider:     Valerie Roys, DO Primary Care Physician:  Valerie Roys, DO Reason for Consultation:   GERD        HPI:    Chief Complaint  Patient presents with  . New Admit To SNF  . Gastroesophageal Reflux    States in the last week her symptoms have been off and on. If patient bend down after she eats. She has had some dsyphagia     Diane Pearson is a 63 y.o. y/o female referred for consultation & management  by Dr. Wynetta Emery, Megan P, DO.  Patient reports that she started having symptoms of GERD about 1 to 2 months ago.  Burning sensation in chest, mild epigastric pain.  She was started on Pepcid which initially did not help but she has continued to take it and now symptoms are much better over the last 3 days.  States she has been asymptomatic over the last 3 days.  No nausea or vomiting.  No weight loss.  Denies any dysphagia, but reports odynophagia to hard meats and pills.  However, this has also resolved as of the last week.  No prior EGDs.  No previous history of similar symptoms.  Colonoscopy screening up-to-date, see chart.  Past Medical History:  Diagnosis Date  . Arthritis   . CAD (coronary artery disease)    Diagnosed on CT scan  . Depression   . Depression   . Hypothyroidism   . Insomnia   . Menopause   . Osteoporosis   . Tremors of nervous system     Past Surgical History:  Procedure Laterality Date  . BREAST BIOPSY Left 04/20/2016   COLUMNAR CELL CHANGE  . COLONOSCOPY WITH PROPOFOL N/A 09/29/2018   Procedure: COLONOSCOPY WITH PROPOFOL;  Surgeon: [name], MD;  Location: [facility] ENDOSCOPY;  Service: Endoscopy;  Laterality: N/A;  . varicose veins  1998    Prior to Admission medications   Medication Sig Start Date End Date Taking? Authorizing Provider  calcium carbonate (OS-CAL -  DOSED IN MG OF ELEMENTAL CALCIUM) 1250 (500 Ca) MG tablet Take 1 tablet by mouth.   Yes [provider]  cholecalciferol (VITAMIN D) 1000 UNITS tablet Take 1,000 Units by mouth daily.   Yes [provider]  cyclobenzaprine (FLEXERIL) 10 MG tablet TAKE ONE TABLET BY MOUTH THREE TIMES A DAY FOR SPASMS 10/03/18  Yes Johnson, Megan P, DO  diclofenac sodium (VOLTAREN) 1 % GEL Apply 2 g topically 4 (four) times daily.   Yes [provider]  DULoxetine (CYMBALTA) 60 MG capsule TAKE ONE CAPSULE BY MOUTH DAILY 11/28/18  Yes Johnson, Megan P, DO  estrogen, conjugated,-medroxyprogesterone (PREMPRO) 0.625-2.5 MG tablet Take 1 tablet by mouth daily. 04/11/18  Yes Johnson, Megan P, DO  famotidine (PEPCID) 10 MG tablet Take 10 mg by mouth at bedtime.   Yes [provider]  ferrous sulfate 325 (65 FE) MG EC tablet Take 325 mg by mouth 3 (three) times daily with meals.   Yes [provider]  FIBER ADULT GUMMIES PO Take by mouth.   Yes [provider]  hydrOXYzine (ATARAX/VISTARIL) 25 MG tablet TAKE ONE TABLET BY MOUTH DAILY 11/06/18  Yes Johnson, Megan P, DO  ibuprofen (ADVIL,MOTRIN) 800 MG tablet Take 800 mg by mouth at bedtime as needed. Takes bid   Yes  [provider]  levothyroxine (SYNTHROID) 50 MCG tablet TAKE ONE TABLET BY MOUTH EVERY MORNING 30 MINUTES BEFORE BREAKFAST 11/28/18  Yes Johnson, Megan P, DO  lidocaine (XYLOCAINE) 5 % ointment Apply 1 application topically 3 (three) times daily as needed.  06/19/15  Yes [provider]  Magnesium 400 MG TABS Take 400 mg by mouth daily. 12/06/17  Yes [provider]  metoprolol succinate (TOPROL-XL) 50 MG 24 hr tablet TAKE ONE TABLET BY MOUTH DAILY WITH OR IMMEDIATELY FOLLOWING A MEAL 11/28/18  Yes Johnson, Megan P, DO  Multiple Vitamins-Minerals (CENTRUM SILVER ADULT 50+ PO) Take 1 tablet by mouth daily.   Yes [provider]  Potassium 99 MG TABS Take 99 mg by mouth daily.   Yes  [provider]  pregabalin (LYRICA) 100 MG capsule TAKE ONE CAPSULE BY MOUTH TWICE A DAY 11/28/18  Yes Johnson, Megan P, DO  vitamin B-12 (CYANOCOBALAMIN) 500 MCG tablet Take 500 mcg by mouth daily.   Yes [provider]  VITAMIN E PO Take by mouth daily.   Yes [provider]  zolpidem (AMBIEN) 10 MG tablet Take 1 tablet (10 mg total) by mouth at bedtime. 06/13/18  Yes Johnson, Megan P, DO    Family History  Problem Relation Age of Onset  . Cancer Father        Lung  . Tuberculosis Father   . Breast cancer Maternal Grandmother 42  . Ulcerative colitis Sister   . Anxiety disorder Daughter   . Alzheimer's disease Maternal Grandfather   . Heart disease Paternal Grandmother        MI  . Heart disease Paternal Grandfather        MI  . Breast cancer Maternal Aunt        70's     Social History   Tobacco Use  . Smoking status: Current Every Day Smoker    Packs/day: 1.00    Years: 45.00    Pack years: 45.00    Types: Cigarettes  . Smokeless tobacco: Never Used  Substance Use Topics  . Alcohol use: No    Alcohol/week: 0.0 standard drinks  . Drug use: No    Allergies as of 12/06/2018 - Review Complete 12/06/2018  Allergen Reaction Noted  . Erythromycin Other (See Comments) 05/20/2014  . Gabapentin Other (See Comments) 05/20/2014  . Tamiflu [oseltamivir phosphate] Other (See Comments) 09/25/2014  . Tramadol  03/03/2015  . Nitrofurantoin Diarrhea and Nausea And Vomiting 03/03/2015    Review of Systems:    All systems reviewed and negative except where noted in HPI.   Physical Exam:  BP (!) 174/112 (BP Location: Left Arm, Patient Position: Sitting, Cuff Size: Normal)   Pulse 82   Temp 98.1 F (36.7 C) (Oral)   Ht 5\' 6"  (1.676 m)   Wt 170 lb 8 oz (77.3 kg)   LMP 09/28/2008 (Approximate)   BMI 27.52 kg/m  Patient's last menstrual period was 09/28/2008 (approximate). Psych:  Alert and cooperative. Normal mood and affect. General:   Alert,   Well-developed, well-nourished, pleasant and cooperative in NAD Head:  Normocephalic and atraumatic. Eyes:  Sclera clear, no icterus.   Conjunctiva pink. Ears:  Normal auditory acuity. Nose:  No deformity, discharge, or lesions. Mouth:  No deformity or lesions,oropharynx pink & moist. Neck:  Supple; no masses or thyromegaly. Abdomen:  Normal bowel sounds.  No bruits.  Soft, non-tender and non-distended without masses, hepatosplenomegaly or hernias noted.  No guarding or rebound tenderness.  Msk:  Symmetrical without gross deformities. Good, equal movement & strength bilaterally. Pulses:  Normal pulses noted. Extremities:  No clubbing or edema.  No cyanosis. Neurologic:  Alert and oriented x3;  grossly normal neurologically. Skin:  Intact without significant lesions or rashes. No jaundice. Lymph Nodes:  No significant cervical adenopathy. Psych:  Alert and cooperative. Normal mood and affect.   Labs: CBC    Component Value Date/Time   WBC 6.4 06/22/2018 0955   RBC 3.60 (L) 06/22/2018 0955   HGB 11.4 06/22/2018 0955   HCT 32.2 (L) 06/22/2018 0955   PLT 228 06/22/2018 0955   MCV 89 06/22/2018 0955   MCH 31.7 06/22/2018 0955   MCHC 35.4 06/22/2018 0955   RDW 12.5 06/22/2018 0955   LYMPHSABS 1.5 06/22/2018 0955   EOSABS 0.3 06/22/2018 0955   BASOSABS 0.1 06/22/2018 0955   CMP     Component Value Date/Time   NA 136 06/22/2018 0955   K 4.5 06/22/2018 0955   CL 101 06/22/2018 0955   CO2 23 06/22/2018 0955   GLUCOSE 102 (H) 06/22/2018 0955   BUN 13 06/22/2018 0955   CREATININE 0.76 06/22/2018 0955   CALCIUM 8.8 06/22/2018 0955   PROT 6.4 06/22/2018 0955   ALBUMIN 3.8 06/22/2018 0955   AST 22 06/22/2018 0955   ALT 19 06/22/2018 0955   ALKPHOS 78 06/22/2018 0955   BILITOT 0.3 06/22/2018 0955   GFRNONAA 85 06/22/2018 0955   GFRAA 98 06/22/2018 0955    Imaging Studies: No results found.  Assessment and Plan:   SHARDE JURICA is a 63 y.o. y/o female has been referred  for GERD and odynophagia  Symptoms consistent with GERD Odynophagia resolved  Patient made some lifestyle changes including avoiding things with caffeine and this has helped her symptoms dramatically  Patient educated extensively on acid reflux lifestyle modification, including buying a bed wedge, not eating 3 hrs before bedtime, diet modifications, and handout given for the same.   Continue Pepcid for 1-2 more months and then discontinue  However, if symptoms return, can change over to PPI and discussed need for EGD if needed  Odynophagia is usually not caused by GERD and this was discussed with her.  We discussed the option of proceeding with upper endoscopy at this time to evaluate for Candida esophagitis.  However, since symptoms have resolved as of the last week patient would like to not have any procedures done at this time and if symptoms return we will consider them.  She does get thrush intermittently, and therefore may have had Candida esophagitis that seems to have symptomatically resolved as of now.  Patient states she will call us if symptoms return on current management    Dr Diane Antigua  Speech recognition software was used to dictate the above note.

## 2019-01-07 ENCOUNTER — Other Ambulatory Visit: Payer: Self-pay | Admitting: Family Medicine

## 2019-01-08 NOTE — Telephone Encounter (Signed)
Requested medication (s) are due for refill today: yes  Requested medication (s) are on the active medication list: yes  Last refill:  12/12/2018  Future visit scheduled: yes  Notes to clinic:  Refill cannot be delegated    Requested Prescriptions  Pending Prescriptions Disp Refills   zolpidem (AMBIEN) 10 MG tablet [Pharmacy Med Name: ZOLPIDEM TARTRATE 10 MG TABLET] 30 tablet 4    Sig: TAKE ONE TABLET BY MOUTH EVERY NIGHT AT BEDTIME     Not Delegated - Psychiatry:  Anxiolytics/Hypnotics Failed - 01/07/2019 10:23 AM      Failed - This refill cannot be delegated      Failed - Urine Drug Screen completed in last 360 days.      Passed - Valid encounter within last 6 months    Recent Outpatient Visits          2 months ago Chronic pain of left knee   [facility] [place], Megan P, DO   3 months ago Routine general medical examination at a health care facility   [facility], [place], DO   5 months ago [facility], [place], DO   6 months ago Hypothyroidism, unspecified type   [place], [place], DO   1 year ago Hypothyroidism, unspecified type   [place], Barb Merino, DO      Future Appointments            Tomorrow Valerie Roys, DO [place], [place]   In 6 months McGowan, Gordan Payment [facility]           Signed Prescriptions Disp Refills   hydrOXYzine (ATARAX/VISTARIL) 25 MG tablet 30 tablet 0    Sig: TAKE ONE TABLET BY MOUTH DAILY     Ear, Nose, and Throat:  Antihistamines Passed - 01/07/2019 10:23 AM      Passed - Valid encounter within last 12 months    Recent Outpatient Visits          2 months ago Chronic pain of left knee   [place], Megan P, DO   3 months ago Routine general medical examination at a health care facility   [facility], [place], DO   5 months ago [place], [place], DO   6 months ago Hypothyroidism, unspecified type   [place], [place], DO   1 year ago Hypothyroidism, unspecified type   [place], Barb Merino, DO      Future Appointments            Tomorrow Valerie Roys, DO [place], [place]   In 6 months McGowan, Shannon A, [place]

## 2019-01-09 ENCOUNTER — Telehealth: Payer: Self-pay | Admitting: Family Medicine

## 2019-01-09 ENCOUNTER — Encounter: Payer: Self-pay | Admitting: Family Medicine

## 2019-01-09 ENCOUNTER — Other Ambulatory Visit: Payer: Self-pay

## 2019-01-09 ENCOUNTER — Ambulatory Visit (INDEPENDENT_AMBULATORY_CARE_PROVIDER_SITE_OTHER): Payer: Medicare Other | Admitting: Family Medicine

## 2019-01-09 VITALS — Temp 97.6°F | Ht 66.0 in | Wt 168.0 lb

## 2019-01-09 DIAGNOSIS — M797 Fibromyalgia: Secondary | ICD-10-CM

## 2019-01-09 DIAGNOSIS — E039 Hypothyroidism, unspecified: Secondary | ICD-10-CM

## 2019-01-09 DIAGNOSIS — G250 Essential tremor: Secondary | ICD-10-CM | POA: Diagnosis not present

## 2019-01-09 DIAGNOSIS — F5101 Primary insomnia: Secondary | ICD-10-CM

## 2019-01-09 DIAGNOSIS — F332 Major depressive disorder, recurrent severe without psychotic features: Secondary | ICD-10-CM

## 2019-01-09 DIAGNOSIS — E782 Mixed hyperlipidemia: Secondary | ICD-10-CM

## 2019-01-09 DIAGNOSIS — N951 Menopausal and female climacteric states: Secondary | ICD-10-CM

## 2019-01-09 MED ORDER — ZOLPIDEM TARTRATE 10 MG PO TABS: 10.0000 mg | ORAL_TABLET | Freq: Every day | ORAL | 5 refills | Status: DC

## 2019-01-09 MED ORDER — HYDROXYZINE HCL 25 MG PO TABS: 25.0000 mg | ORAL_TABLET | Freq: Every day | ORAL | 1 refills | Status: DC

## 2019-01-09 MED ORDER — METOPROLOL SUCCINATE ER 50 MG PO TB24: ORAL_TABLET | ORAL | 1 refills | Status: DC

## 2019-01-09 MED ORDER — IBUPROFEN 800 MG PO TABS: 800.0000 mg | ORAL_TABLET | Freq: Two times a day (BID) | ORAL | 3 refills | Status: DC | PRN

## 2019-01-09 MED ORDER — IBUPROFEN 800 MG PO TABS: 800.0000 mg | ORAL_TABLET | Freq: Every evening | ORAL | 3 refills | Status: DC | PRN

## 2019-01-09 MED ORDER — DULOXETINE HCL 60 MG PO CPEP: 60.0000 mg | ORAL_CAPSULE | Freq: Every day | ORAL | 1 refills | Status: DC

## 2019-01-09 MED ORDER — PREGABALIN 100 MG PO CAPS: 100.0000 mg | ORAL_CAPSULE | Freq: Two times a day (BID) | ORAL | 1 refills | Status: DC

## 2019-01-09 NOTE — Telephone Encounter (Signed)
Routing to provider. RX has 2 different directions. Do you know which is correct?

## 2019-01-09 NOTE — Progress Notes (Signed)
Temp 97.6 F (36.4 C)   Ht '5\' 6"'  (1.676 m)   Wt 168 lb (76.2 kg)   LMP 09/28/2008 (Approximate)   BMI 27.12 kg/m    Subjective:    Patient ID: Diane Pearson, female    DOB: [birth year], 63 y.o.   MRN: [id number]  HPI: Diane Pearson is a 63 y.o. female  Chief Complaint  Patient presents with  . Insomnia   DEPRESSION Mood status: controlled Satisfied with current treatment?: yes Symptom severity: mild  Duration of current treatment : chronic Side effects: no Medication compliance: excellent compliance Psychotherapy/counseling: no  Previous psychiatric medications: cymbalta Depressed mood: yes Anxious mood: yes Anhedonia: no Significant weight loss or gain: no Insomnia: yes  Fatigue: yes Feelings of worthlessness or guilt: no Impaired concentration/indecisiveness: no Suicidal ideations: no Hopelessness: no Crying spells: no Depression screen [facility] 2/9 01/09/2019 10/23/2018 10/03/2018 09/27/2018 06/13/2018  Decreased Interest '1 2 2 ' 0 0  Down, Depressed, Hopeless 0 2 0 0 0  PHQ - 2 Score '1 4 2 ' 0 0  Altered sleeping - 2 3 - 2  Tired, decreased energy - 2 3 - 2  Change in appetite - 0 0 - 0  Feeling bad or failure about yourself  - 0 0 - 0  Trouble concentrating - 3 3 - 1  Moving slowly or fidgety/restless - 0 0 - 0  Suicidal thoughts - 0 0 - 0  PHQ-9 Score - 11 11 - 5  Difficult doing work/chores - Very difficult Somewhat difficult - Not difficult at all   HYPERLIPIDEMIA Hyperlipidemia status: stable Satisfied with current treatment?  yes Side effects:  Not on anything Past cholesterol meds: none Supplements: none Aspirin:  no The 10-year ASCVD risk score Mikey Bussing DC Jr., et al., 2013) is: 14.2%   Values used to calculate the score:     Age: 24 years     Sex: Female     Is Non-Hispanic African American: No     Diabetic: No     Tobacco smoker: Yes     Systolic Blood Pressure: 389 mmHg     Is BP treated: No     HDL Cholesterol: 46 mg/dL     Total Cholesterol:  172 mg/dL Chest pain:  no Coronary artery disease:  no  FIBROMYALGIA Pain status: stable Satisfied with current treatment?: yes Medication side effects: yes Medication compliance: excellent compliance Duration: chronic Location: widespread Quality: dull and aching Current pain level: moderate Previous pain level: moderate Aggravating factors: stress Alleviating factors: medication Previous pain specialty evaluation: no Non-narcotic analgesic meds: yes Narcotic contract:no  HYPOTHYROIDISM Thyroid control status:controlled Satisfied with current treatment? yes Medication side effects: no Medication compliance: excellent compliance Recent dose adjustment:no Fatigue: yes Cold intolerance: yes Heat intolerance: no Weight gain: no Weight loss: no Constipation: no Diarrhea/loose stools: no Palpitations: no Lower extremity edema: no Anxiety/depressed mood: yes  INSOMNIA Duration: chronic Satisfied with sleep quality: yes Difficulty falling asleep: no Difficulty staying asleep: no Waking a few hours after sleep onset: no Early morning awakenings: no Daytime hypersomnolence: no Wakes feeling refreshed: yes Good sleep hygiene: yes Apnea: no Snoring: no Depressed/anxious mood: yes Recent stress: no Restless legs/nocturnal leg cramps: no Chronic pain/arthritis: yes History of sleep study: no Treatments attempted: melatonin, uinsom, benadryl and ambien    Relevant past medical, surgical, family and social history reviewed and updated as indicated. Interim medical history since our last visit reviewed. Allergies and medications reviewed and updated.  Review of Systems  Constitutional:  Negative.   Respiratory: Negative.   Cardiovascular: Negative.   Gastrointestinal: Negative.   Musculoskeletal: Positive for back pain and myalgias. Negative for arthralgias, gait problem, joint swelling, neck pain and neck stiffness.  Neurological: Negative.   Psychiatric/Behavioral:  Positive for dysphoric mood and sleep disturbance. Negative for agitation, behavioral problems, confusion, decreased concentration, hallucinations, self-injury and suicidal ideas. The patient is not nervous/anxious and is not hyperactive.     Per HPI unless specifically indicated above     Objective:    Temp 97.6 F (36.4 C)   Ht '5\' 6"'  (1.676 m)   Wt 168 lb (76.2 kg)   LMP 09/28/2008 (Approximate)   BMI 27.12 kg/m   Wt Readings from Last 3 Encounters:  01/09/19 168 lb (76.2 kg)  12/06/18 170 lb 8 oz (77.3 kg)  10/23/18 168 lb (76.2 kg)    Physical Exam Vitals signs and nursing note reviewed.  Constitutional:      General: She is not in acute distress.    Appearance: Normal appearance. She is not ill-appearing, toxic-appearing or diaphoretic.  HENT:     Head: Normocephalic and atraumatic.     Right Ear: External ear normal.     Left Ear: External ear normal.     Nose: Nose normal.     Mouth/Throat:     Mouth: Mucous membranes are moist.     Pharynx: Oropharynx is clear.  Eyes:     General: No scleral icterus.       Right eye: No discharge.        Left eye: No discharge.     Conjunctiva/sclera: Conjunctivae normal.     Pupils: Pupils are equal, round, and reactive to light.  Neck:     Musculoskeletal: Normal range of motion.  Pulmonary:     Effort: Pulmonary effort is normal. No respiratory distress.     Comments: Speaking in full sentences Musculoskeletal: Normal range of motion.  Skin:    Coloration: Skin is not jaundiced or pale.     Findings: No bruising, erythema, lesion or rash.  Neurological:     Mental Status: She is alert and oriented to person, place, and time. Mental status is at baseline.  Psychiatric:        Mood and Affect: Mood normal.        Behavior: Behavior normal.        Thought Content: Thought content normal.        Judgment: Judgment normal.     Results for orders placed or performed during the hospital encounter of 09/29/18  Surgical  pathology  Result Value Ref Range   SURGICAL PATHOLOGY      Surgical Pathology CASE: [id number] PATIENT: Diane Pearson Surgical Pathology Report     SPECIMEN SUBMITTED: A. Colon polyp, transverse; cold snare, rectum polyp; cbx  CLINICAL HISTORY: None provided  PRE-OPERATIVE DIAGNOSIS: Screening colonoscopy  POST-OPERATIVE DIAGNOSIS: Colon polyps     DIAGNOSIS: A. COLON POLYP, TRANSVERSE AND RECTUM; COLD SNARE, COLD BIOPSY: - TUBULAR ADENOMA, ONE FRAGMENT. - HYPERPLASTIC POLYP, ONE FRAGMENT. - NEGATIVE FOR HIGH-GRADE DYSPLASIA AND MALIGNANCY.   GROSS DESCRIPTION: A. Labeled: Transverse colon polyp cold snare x1, rectum polyp C BX x1 Received: In formalin Tissue fragment(s): 2 Size: 0.3 and 0.4 cm Description: Tan soft tissue fragments Entirely submitted in 1 cassette.   Final Diagnosis performed by Betsy Pries, MD.   Electronically signed 10/02/2018 9:40:04AM The electronic signature indicates that the named Attending Pathologist has evaluated the specimen  Technical component performed at [facility],  [address], [place], [place] [address] Lab: [phone] Dir: Rush Farmer, MD, MMM  Professional component performed at [facility], [facility], [place], [place], [place] [address] Lab: [phone] Dir: Dellia Nims. Reuel Derby, MD       Assessment & Plan:   Problem List Items Addressed This Visit      Endocrine   Hypothyroid - Primary    Rechecking levels ASAP. Await results. Call with any concerns. Treat as needed.       Relevant Medications   metoprolol succinate (TOPROL-XL) 50 MG 24 hr tablet   Other Relevant Orders   CBC with Differential OUT   Comp Met (CMET)   TSH     Nervous and Auditory   Benign essential tremor    Stable. Continue to monitor.       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 Continue  current regimen. Continue to monitor. Call with any concerns. Refills given. Labs to be checked ASAP       Relevant Medications   DULoxetine (CYMBALTA) 60 MG capsule   hydrOXYzine (ATARAX/VISTARIL) 25 MG tablet   Other Relevant Orders   CBC with Differential OUT   Comp Met (CMET)   Insomnia    Stable. Refills for 6 months given. Follow up 6 months.       Relevant Orders   CBC with Differential OUT   Comp Met (CMET)   Symptomatic menopausal or female climacteric states    Under good control on current regimen. Continue current regimen. Continue to monitor. Call with any concerns. Refills given.        Relevant Orders   CBC with Differential OUT   Comp Met (CMET)   UA/M w/rflx Culture, Routine (Completed)   Fibromyalgia    Stable. Continue lyrica. Continue to monitor. Call with any concerns.       Relevant Medications   pregabalin (LYRICA) 100 MG capsule   DULoxetine (CYMBALTA) 60 MG capsule   Other Relevant Orders   CBC with Differential OUT   Comp Met (CMET)   Hyperlipidemia    Rechecking levels today. Await results. Treat as needed.       Relevant Medications   metoprolol succinate (TOPROL-XL) 50 MG 24 hr tablet   Other Relevant Orders   CBC with Differential OUT   Comp Met (CMET)   Lipid Panel w/o Chol/HDL Ratio OUT       Follow up plan: Return in about 6 months (around 07/10/2019).    . This visit was completed via FaceTime due to the restrictions of the COVID-19 pandemic. All issues as above were discussed and addressed. Physical exam was done as above through visual confirmation on FaceTime. If it was felt that the patient should be evaluated in the office, they were directed there. The patient verbally consented to this visit. . Location of the patient: home . Location of the provider: work . Those involved with this call:  . Provider: Park Liter, DO . CMA: Tiffany Reel, CMA . Front Desk/Registration: Don Perking  . Time spent on call: 25  minutes with patient face to face via video conference. More than 50% of this time was spent in counseling and coordination of care. 40 minutes total spent in review of patient's record and preparation of their chart.

## 2019-01-09 NOTE — Telephone Encounter (Signed)
Pharmacy calling to request clarification on the directions for ibuprofen (ADVIL) 800 MG tablet

## 2019-01-10 ENCOUNTER — Encounter: Payer: Self-pay | Admitting: Family Medicine

## 2019-01-10 ENCOUNTER — Other Ambulatory Visit: Payer: Medicare Other

## 2019-01-10 ENCOUNTER — Other Ambulatory Visit: Payer: Self-pay

## 2019-01-10 DIAGNOSIS — N951 Menopausal and female climacteric states: Secondary | ICD-10-CM | POA: Diagnosis not present

## 2019-01-10 DIAGNOSIS — E782 Mixed hyperlipidemia: Secondary | ICD-10-CM | POA: Diagnosis not present

## 2019-01-10 DIAGNOSIS — E039 Hypothyroidism, unspecified: Secondary | ICD-10-CM

## 2019-01-10 DIAGNOSIS — M797 Fibromyalgia: Secondary | ICD-10-CM | POA: Diagnosis not present

## 2019-01-10 DIAGNOSIS — G250 Essential tremor: Secondary | ICD-10-CM

## 2019-01-10 DIAGNOSIS — F5101 Primary insomnia: Secondary | ICD-10-CM

## 2019-01-10 DIAGNOSIS — F332 Major depressive disorder, recurrent severe without psychotic features: Secondary | ICD-10-CM

## 2019-01-10 DIAGNOSIS — R8271 Bacteriuria: Secondary | ICD-10-CM | POA: Diagnosis not present

## 2019-01-10 NOTE — Assessment & Plan Note (Signed)
Under good control on current regimen. Continue current regimen. Continue to monitor. Call with any concerns. Refills given. Labs to be checked ASAP.  

## 2019-01-10 NOTE — Assessment & Plan Note (Signed)
Rechecking levels today. Await results. Treat as needed.  

## 2019-01-10 NOTE — Assessment & Plan Note (Signed)
Under good control on current regimen. Continue current regimen. Continue to monitor. Call with any concerns. Refills given.   

## 2019-01-10 NOTE — Assessment & Plan Note (Signed)
Stable.       - Continue to monitor

## 2019-01-10 NOTE — Assessment & Plan Note (Signed)
Rechecking levels ASAP. Await results. Call with any concerns. Treat as needed.

## 2019-01-10 NOTE — Assessment & Plan Note (Signed)
Stable. Continue lyrica. Continue to monitor. Call with any concerns.

## 2019-01-10 NOTE — Assessment & Plan Note (Signed)
Stable. Refills for 6 months given. Follow up 6 months.

## 2019-01-11 LAB — CBC WITH DIFFERENTIAL/PLATELET
Basophils Absolute: 0.1 10*3/uL (ref 0.0–0.2)
Basos: 1 %
EOS (ABSOLUTE): 0.4 10*3/uL (ref 0.0–0.4)
Eos: 5 %
Hematocrit: 33.9 % — ABNORMAL LOW (ref 34.0–46.6)
Hemoglobin: 11.4 g/dL (ref 11.1–15.9)
Immature Grans (Abs): 0 10*3/uL (ref 0.0–0.1)
Immature Granulocytes: 0 %
Lymphocytes Absolute: 2.3 10*3/uL (ref 0.7–3.1)
Lymphs: 31 %
MCH: 31.7 pg (ref 26.6–33.0)
MCHC: 33.6 g/dL (ref 31.5–35.7)
MCV: 94 fL (ref 79–97)
Monocytes Absolute: 0.5 10*3/uL (ref 0.1–0.9)
Monocytes: 7 %
Neutrophils Absolute: 4.1 10*3/uL (ref 1.4–7.0)
Neutrophils: 56 %
Platelets: 248 10*3/uL (ref 150–450)
RBC: 3.6 x10E6/uL — ABNORMAL LOW (ref 3.77–5.28)
RDW: 12.1 % (ref 11.7–15.4)
WBC: 7.4 10*3/uL (ref 3.4–10.8)

## 2019-01-11 LAB — COMPREHENSIVE METABOLIC PANEL
ALT: 14 IU/L (ref 0–32)
AST: 17 IU/L (ref 0–40)
Albumin/Globulin Ratio: 1.3 (ref 1.2–2.2)
Albumin: 3.7 g/dL — ABNORMAL LOW (ref 3.8–4.8)
Alkaline Phosphatase: 88 IU/L (ref 39–117)
BUN/Creatinine Ratio: 23 (ref 12–28)
BUN: 20 mg/dL (ref 8–27)
Bilirubin Total: <0.2 mg/dL (ref 0.0–1.2)
CO2: 26 mmol/L (ref 20–29)
Calcium: 9.3 mg/dL (ref 8.7–10.3)
Chloride: 103 mmol/L (ref 96–106)
Creatinine, Ser: 0.88 mg/dL (ref 0.57–1.00)
GFR calc Af Amer: 81 mL/min/{1.73_m2} (ref >59)
GFR calc non Af Amer: 71 mL/min/{1.73_m2} (ref >59)
Globulin, Total: 2.9 g/dL (ref 1.5–4.5)
Glucose: 99 mg/dL (ref 65–99)
Potassium: 4.1 mmol/L (ref 3.5–5.2)
Sodium: 141 mmol/L (ref 134–144)
Total Protein: 6.6 g/dL (ref 6.0–8.5)

## 2019-01-11 LAB — LIPID PANEL W/O CHOL/HDL RATIO
Cholesterol, Total: 158 mg/dL (ref 100–199)
HDL: 38 mg/dL — ABNORMAL LOW (ref >39)
LDL Chol Calc (NIH): 89 mg/dL (ref 0–99)
Triglycerides: 182 mg/dL — ABNORMAL HIGH (ref 0–149)
VLDL Cholesterol Cal: 31 mg/dL (ref 5–40)

## 2019-01-11 LAB — TSH: TSH: 3.5 u[IU]/mL (ref 0.450–4.500)

## 2019-01-12 LAB — UA/M W/RFLX CULTURE, ROUTINE
Bilirubin, UA: NEGATIVE
Glucose, UA: NEGATIVE
Ketones, UA: NEGATIVE
Leukocytes,UA: NEGATIVE
Nitrite, UA: NEGATIVE
Protein,UA: NEGATIVE
Specific Gravity, UA: 1.025 (ref 1.005–1.030)
Urobilinogen, Ur: 0.2 mg/dL (ref 0.2–1.0)
pH, UA: 6 (ref 5.0–7.5)

## 2019-01-12 LAB — MICROSCOPIC EXAMINATION

## 2019-01-12 LAB — URINE CULTURE, REFLEX

## 2019-01-17 ENCOUNTER — Ambulatory Visit: Payer: Medicare Other | Admitting: Gastroenterology

## 2019-02-09 DIAGNOSIS — U071 COVID-19: Secondary | ICD-10-CM

## 2019-02-09 HISTORY — DX: COVID-19: U07.1

## 2019-02-26 ENCOUNTER — Telehealth: Payer: Self-pay | Admitting: Family Medicine

## 2019-02-26 ENCOUNTER — Encounter: Payer: Self-pay | Admitting: Family Medicine

## 2019-02-26 DIAGNOSIS — M545 Low back pain, unspecified: Secondary | ICD-10-CM

## 2019-02-26 DIAGNOSIS — G8929 Other chronic pain: Secondary | ICD-10-CM

## 2019-02-26 NOTE — Telephone Encounter (Signed)
Copied from [place] [id number]. Topic: Referral - Request for Referral >> Feb 23, 2019  9:39 AM Richardo Priest, NT wrote: Has patient seen PCP for this complaint?  yes *If NO, is insurance requiring patient see PCP for this issue before PCP can refer them? Referral for which specialty: chiropractor  Preferred provider/office: Dr.Paul Brugger/ [facility] Reason for referral: pain has come back

## 2019-02-28 ENCOUNTER — Encounter: Payer: Self-pay | Admitting: Family Medicine

## 2019-03-07 ENCOUNTER — Other Ambulatory Visit: Payer: Self-pay | Admitting: Family Medicine

## 2019-03-07 NOTE — Telephone Encounter (Signed)
Forwarding medication refill request to the PCP for review.

## 2019-03-11 ENCOUNTER — Other Ambulatory Visit: Payer: Self-pay | Admitting: Family Medicine

## 2019-03-11 MED ORDER — LEVOTHYROXINE SODIUM 50 MCG PO TABS: ORAL_TABLET | ORAL | 3 refills | Status: DC

## 2019-04-07 ENCOUNTER — Other Ambulatory Visit: Payer: Self-pay | Admitting: Family Medicine

## 2019-04-07 NOTE — Telephone Encounter (Signed)
Requested Prescriptions  Pending Prescriptions Disp Refills  . PREMPRO 0.625-2.5 MG tablet [Pharmacy Med Name: PREMPRO 0.625-2.5 MG TABLET] 28 tablet 2    Sig: TAKE ONE TABLET BY MOUTH DAILY     OB/GYN:  Hormone Combinations Failed - 04/07/2019  5:04 PM      Failed - Last BP in normal range    BP Readings from Last 1 Encounters:  12/06/18 (!) 174/112         Passed - Mammogram is up-to-date per Health Maintenance      Passed - Valid encounter within last 12 months    Recent Outpatient Visits          2 months ago Hypothyroidism, unspecified type   [place], Megan P, DO   5 months ago Chronic pain of left knee   [facility], Megan P, DO   6 months ago Routine general medical examination at a health care facility   [place], [place], DO   8 months ago [place], [place], DO   9 months ago Hypothyroidism, unspecified type   [place], Barb Merino, DO      Future Appointments            In 3 months McGowan, Shannon A, [place]

## 2019-05-10 ENCOUNTER — Ambulatory Visit: Payer: Medicare Other | Attending: Internal Medicine

## 2019-05-10 DIAGNOSIS — Z23 Encounter for immunization: Secondary | ICD-10-CM

## 2019-05-10 NOTE — Progress Notes (Signed)
   Covid-19 Vaccination Clinic  Name:  Diane Pearson    MRN: [id number] DOB: 10-Nov-1955  05/10/2019  Diane Pearson was observed post Covid-19 immunization for 15 minutes without incident. She was provided with Vaccine Information Sheet and instruction to access the V-Safe system.   Diane Pearson was instructed to call 911 with any severe reactions post vaccine: Marland Kitchen Difficulty breathing  . Swelling of face and throat  . A fast heartbeat  . A bad rash all over body  . Dizziness and weakness   Immunizations Administered    Name Date Dose VIS Date Route   Pfizer COVID-19 Vaccine 05/10/2019 10:26 AM 0.3 mL 01/19/2019 Intramuscular   Manufacturer: [place]   Lot: [id number]   [place]: [id number]

## 2019-05-22 ENCOUNTER — Encounter: Payer: Self-pay | Admitting: Family Medicine

## 2019-05-24 ENCOUNTER — Ambulatory Visit: Payer: Medicare Other | Admitting: Family Medicine

## 2019-05-30 ENCOUNTER — Encounter: Payer: Self-pay | Admitting: Family Medicine

## 2019-05-30 ENCOUNTER — Telehealth (INDEPENDENT_AMBULATORY_CARE_PROVIDER_SITE_OTHER): Payer: Medicare Other | Admitting: Family Medicine

## 2019-05-30 DIAGNOSIS — M797 Fibromyalgia: Secondary | ICD-10-CM | POA: Diagnosis not present

## 2019-05-30 MED ORDER — PREGABALIN 100 MG PO CAPS: 100.0000 mg | ORAL_CAPSULE | Freq: Three times a day (TID) | ORAL | 1 refills | Status: DC

## 2019-05-30 MED ORDER — IBUPROFEN 800 MG PO TABS: 800.0000 mg | ORAL_TABLET | Freq: Three times a day (TID) | ORAL | 3 refills | Status: DC | PRN

## 2019-05-30 NOTE — Progress Notes (Signed)
LMP 09/28/2008 (Approximate)    Subjective:    Patient ID: Diane Pearson, female    DOB: 01-30-1956, 64 y.o.   MRN: [id number]  HPI: Diane Pearson is a 64 y.o. female  Chief Complaint  Patient presents with  . Pain    wants to increase ibuprofen to TID   FIBROMYALGIA Pain status: uncontrolled Satisfied with current treatment?: no Medication side effects: no Medication compliance: excellent compliance Duration: chronic Location: widespread Quality: aching and sore Current pain level: moderate Previous pain level: mild Alleviating factors: rest, ice, heat, laying, NSAIDs, APAP and muscle relaxer Previous pain specialty evaluation: yes Non-narcotic analgesic meds: yes Narcotic contract:no Treatments attempted: rest, ice, heat, APAP, ibuprofen and aleve    Relevant past medical, surgical, family and social history reviewed and updated as indicated. Interim medical history since our last visit reviewed. Allergies and medications reviewed and updated.  Review of Systems  Constitutional: Negative.   Respiratory: Negative.   Cardiovascular: Negative.   Gastrointestinal: Negative.   Musculoskeletal: Positive for arthralgias, back pain and myalgias. Negative for gait problem, joint swelling, neck pain and neck stiffness.  Skin: Negative.   Neurological: Negative.   Psychiatric/Behavioral: Negative.     Per HPI unless specifically indicated above     Objective:    LMP 09/28/2008 (Approximate)   Wt Readings from Last 3 Encounters:  01/09/19 168 lb (76.2 kg)  12/06/18 170 lb 8 oz (77.3 kg)  10/23/18 168 lb (76.2 kg)    Physical Exam Vitals and nursing note reviewed.  Constitutional:      General: She is not in acute distress.    Appearance: Normal appearance. She is not ill-appearing, toxic-appearing or diaphoretic.  HENT:     Head: Normocephalic and atraumatic.     Right Ear: External ear normal.     Left Ear: External ear normal.     Nose: Nose normal.   Mouth/Throat:     Mouth: Mucous membranes are moist.     Pharynx: Oropharynx is clear.  Eyes:     General: No scleral icterus.       Right eye: No discharge.        Left eye: No discharge.     Conjunctiva/sclera: Conjunctivae normal.     Pupils: Pupils are equal, round, and reactive to light.  Pulmonary:     Effort: Pulmonary effort is normal. No respiratory distress.     Comments: Speaking in full sentences Musculoskeletal:        General: Normal range of motion.     Cervical back: Normal range of motion.  Skin:    Coloration: Skin is not jaundiced or pale.     Findings: No bruising, erythema, lesion or rash.  Neurological:     Mental Status: She is alert and oriented to person, place, and time. Mental status is at baseline.  Psychiatric:        Mood and Affect: Mood normal.        Behavior: Behavior normal.        Thought Content: Thought content normal.        Judgment: Judgment normal.     Results for orders placed or performed in visit on 01/10/19  Microscopic Examination   URINE  Result Value Ref Range   WBC, UA 0-5 0 - 5 /hpf   RBC 3-10 (A) 0 - 2 /hpf   Epithelial Cells (non renal) 0-10 0 - 10 /hpf   Bacteria, UA Moderate (A) None seen/Few  Urine Culture, Reflex  URINE  Result Value Ref Range   Urine Culture, Routine Final report    Organism ID, Bacteria Comment   UA/M w/rflx Culture, Routine   Specimen: Urine   URINE  Result Value Ref Range   Specific Gravity, UA 1.025 1.005 - 1.030   pH, UA 6.0 5.0 - 7.5   Color, UA Yellow Yellow   Appearance Ur Clear Clear   Leukocytes,UA Negative Negative   Protein,UA Negative Negative/Trace   Glucose, UA Negative Negative   Ketones, UA Negative Negative   RBC, UA 2+ (A) Negative   Bilirubin, UA Negative Negative   Urobilinogen, Ur 0.2 0.2 - 1.0 mg/dL   Nitrite, UA Negative Negative   Microscopic Examination See below:    Urinalysis Reflex Comment   TSH  Result Value Ref Range   TSH 3.500 0.450 - 4.500 uIU/mL    Lipid Panel w/o Chol/HDL Ratio OUT  Result Value Ref Range   Cholesterol, Total 158 100 - 199 mg/dL   Triglycerides 182 (H) 0 - 149 mg/dL   HDL 38 (L) >39 mg/dL   VLDL Cholesterol Cal 31 5 - 40 mg/dL   LDL Chol Calc (NIH) 89 0 - 99 mg/dL  Comp Met (CMET)  Result Value Ref Range   Glucose 99 65 - 99 mg/dL   BUN 20 8 - 27 mg/dL   Creatinine, Ser 0.88 0.57 - 1.00 mg/dL   GFR calc non Af Amer 71 >59 mL/min/1.73   GFR calc Af Amer 81 >59 mL/min/1.73   BUN/Creatinine Ratio 23 12 - 28   Sodium 141 134 - 144 mmol/L   Potassium 4.1 3.5 - 5.2 mmol/L   Chloride 103 96 - 106 mmol/L   CO2 26 20 - 29 mmol/L   Calcium 9.3 8.7 - 10.3 mg/dL   Total Protein 6.6 6.0 - 8.5 g/dL   Albumin 3.7 (L) 3.8 - 4.8 g/dL   Globulin, Total 2.9 1.5 - 4.5 g/dL   Albumin/Globulin Ratio 1.3 1.2 - 2.2   Bilirubin Total <0.2 0.0 - 1.2 mg/dL   Alkaline Phosphatase 88 39 - 117 IU/L   AST 17 0 - 40 IU/L   ALT 14 0 - 32 IU/L  CBC with Differential OUT  Result Value Ref Range   WBC 7.4 3.4 - 10.8 x10E3/uL   RBC 3.60 (L) 3.77 - 5.28 x10E6/uL   Hemoglobin 11.4 11.1 - 15.9 g/dL   Hematocrit 33.9 (L) 34.0 - 46.6 %   MCV 94 79 - 97 fL   MCH 31.7 26.6 - 33.0 pg   MCHC 33.6 31.5 - 35.7 g/dL   RDW 12.1 11.7 - 15.4 %   Platelets 248 150 - 450 x10E3/uL   Neutrophils 56 Not Estab. %   Lymphs 31 Not Estab. %   Monocytes 7 Not Estab. %   Eos 5 Not Estab. %   Basos 1 Not Estab. %   Neutrophils Absolute 4.1 1.4 - 7.0 x10E3/uL   Lymphocytes Absolute 2.3 0.7 - 3.1 x10E3/uL   Monocytes Absolute 0.5 0.1 - 0.9 x10E3/uL   EOS (ABSOLUTE) 0.4 0.0 - 0.4 x10E3/uL   Basophils Absolute 0.1 0.0 - 0.2 x10E3/uL   Immature Granulocytes 0 Not Estab. %   Immature Grans (Abs) 0.0 0.0 - 0.1 x10E3/uL      Assessment & Plan:   Problem List Items Addressed This Visit      Other   Fibromyalgia - Primary    Not under good control. Will get her into pain management at her request  and increase her lyrica. Follow up in June as scheduled.  Call with any concerns.       Relevant Medications   ibuprofen (ADVIL) 800 MG tablet   pregabalin (LYRICA) 100 MG capsule   Other Relevant Orders   Ambulatory referral to Pain Clinic       Follow up plan: Return As scheduled.   . This visit was completed via MyChart due to the restrictions of the COVID-19 pandemic. All issues as above were discussed and addressed. Physical exam was done as above through visual confirmation on MyChart. If it was felt that the patient should be evaluated in the office, they were directed there. The patient verbally consented to this visit. . Location of the patient: home . Location of the provider: home . Those involved with this call:  . Provider: Park Liter, DO . CMA: Yvonna Alanis, [place] . Front Desk/Registration: Don Perking  . Time spent on call: 15 minutes with patient face to face via video conference. More than 50% of this time was spent in counseling and coordination of care. 23 minutes total spent in review of patient's record and preparation of their chart.

## 2019-05-30 NOTE — Assessment & Plan Note (Signed)
Not under good control. Will get her into pain management at her request and increase her lyrica. Follow up in June as scheduled. Call with any concerns.

## 2019-05-31 ENCOUNTER — Encounter: Payer: Self-pay | Admitting: Family Medicine

## 2019-06-06 ENCOUNTER — Ambulatory Visit: Payer: Medicare Other | Attending: Internal Medicine

## 2019-06-06 DIAGNOSIS — Z23 Encounter for immunization: Secondary | ICD-10-CM

## 2019-06-06 NOTE — Progress Notes (Signed)
   Covid-19 Vaccination Clinic  Name:  Diane Pearson    MRN: [id number] DOB: 1955-08-01  06/06/2019  [name] was observed post Covid-19 immunization for 15 minutes without incident. She was provided with Vaccine Information Sheet and instruction to access the V-Safe system.   [name] was instructed to call 911 with any severe reactions post vaccine: Marland Kitchen Difficulty breathing  . Swelling of face and throat  . A fast heartbeat  . A bad rash all over body  . Dizziness and weakness   Immunizations Administered    Name Date Dose VIS Date Route   Pfizer COVID-19 Vaccine 06/06/2019 10:37 AM 0.3 mL 04/04/2018 Intramuscular   Manufacturer: [place]   Lot: [id number]   [place]: [id number]

## 2019-06-11 ENCOUNTER — Other Ambulatory Visit: Payer: Self-pay | Admitting: Family Medicine

## 2019-06-11 DIAGNOSIS — Z1231 Encounter for screening mammogram for malignant neoplasm of breast: Secondary | ICD-10-CM

## 2019-06-13 ENCOUNTER — Other Ambulatory Visit: Payer: Self-pay | Admitting: Family Medicine

## 2019-07-12 DIAGNOSIS — K635 Polyp of colon: Secondary | ICD-10-CM | POA: Insufficient documentation

## 2019-07-12 DIAGNOSIS — G894 Chronic pain syndrome: Secondary | ICD-10-CM | POA: Diagnosis not present

## 2019-07-12 DIAGNOSIS — M1990 Unspecified osteoarthritis, unspecified site: Secondary | ICD-10-CM | POA: Diagnosis not present

## 2019-07-12 DIAGNOSIS — M797 Fibromyalgia: Secondary | ICD-10-CM | POA: Diagnosis not present

## 2019-07-13 ENCOUNTER — Other Ambulatory Visit: Payer: Self-pay | Admitting: Family Medicine

## 2019-07-13 ENCOUNTER — Encounter: Payer: Self-pay | Admitting: Family Medicine

## 2019-07-13 NOTE — Telephone Encounter (Signed)
Requested medication (s) are due for refill today: yes  Requested medication (s) are on the active medication list:yes  Last refill:  06/15/2019  Future visit scheduled: no  Notes to clinic:  this refill cannot be delegated    Requested Prescriptions  Pending Prescriptions Disp Refills   zolpidem (AMBIEN) 10 MG tablet [Pharmacy Med Name: ZOLPIDEM TARTRATE 10 MG TABLET] 30 tablet 4    Sig: TAKE ONE TABLET BY MOUTH AT BEDTIME      Not Delegated - Psychiatry:  Anxiolytics/Hypnotics Failed - 07/13/2019  8:51 AM      Failed - This refill cannot be delegated      Failed - Urine Drug Screen completed in last 360 days.      Passed - Valid encounter within last 6 months    Recent Outpatient Visits           1 month ago [place], DO   6 months ago Hypothyroidism, unspecified type   [place], Megan P, DO   8 months ago Chronic pain of left knee   [place], Megan P, DO   9 months ago Routine general medical examination at a health care facility   [facility], [place], DO   11 months ago [place], Barb Merino, DO       Future Appointments             In 1 week McGowan, Shannon A, [place]

## 2019-07-13 NOTE — Telephone Encounter (Signed)
Needs appt, then I'll send through enough to get to appt

## 2019-07-16 ENCOUNTER — Ambulatory Visit (HOSPITAL_COMMUNITY)
Admission: RE | Admit: 2019-07-16 | Discharge: 2019-07-16 | Disposition: A | Discharge status: Home / Self Care | Payer: Self-pay | Source: Ambulatory Visit

## 2019-07-16 ENCOUNTER — Ambulatory Visit
Admission: RE | Admit: 2019-07-16 | Discharge: 2019-07-16 | Disposition: A | Discharge status: Home / Self Care | Payer: Medicare Other | Source: Ambulatory Visit | Attending: Family Medicine | Admitting: Family Medicine

## 2019-07-16 DIAGNOSIS — Z1231 Encounter for screening mammogram for malignant neoplasm of breast: Secondary | ICD-10-CM | POA: Diagnosis not present

## 2019-07-16 NOTE — Telephone Encounter (Signed)
Scheduled mychart visit 6/9

## 2019-07-18 ENCOUNTER — Encounter: Payer: Self-pay | Admitting: Family Medicine

## 2019-07-18 ENCOUNTER — Telehealth (INDEPENDENT_AMBULATORY_CARE_PROVIDER_SITE_OTHER): Payer: Medicare Other | Admitting: Family Medicine

## 2019-07-18 VITALS — Temp 96.9°F

## 2019-07-18 DIAGNOSIS — M25562 Pain in left knee: Secondary | ICD-10-CM

## 2019-07-18 DIAGNOSIS — G250 Essential tremor: Secondary | ICD-10-CM | POA: Diagnosis not present

## 2019-07-18 DIAGNOSIS — D229 Melanocytic nevi, unspecified: Secondary | ICD-10-CM

## 2019-07-18 DIAGNOSIS — R21 Rash and other nonspecific skin eruption: Secondary | ICD-10-CM | POA: Diagnosis not present

## 2019-07-18 DIAGNOSIS — F5101 Primary insomnia: Secondary | ICD-10-CM | POA: Diagnosis not present

## 2019-07-18 DIAGNOSIS — M797 Fibromyalgia: Secondary | ICD-10-CM | POA: Diagnosis not present

## 2019-07-18 DIAGNOSIS — N951 Menopausal and female climacteric states: Secondary | ICD-10-CM | POA: Diagnosis not present

## 2019-07-18 DIAGNOSIS — M546 Pain in thoracic spine: Secondary | ICD-10-CM

## 2019-07-18 DIAGNOSIS — G8929 Other chronic pain: Secondary | ICD-10-CM

## 2019-07-18 DIAGNOSIS — M542 Cervicalgia: Secondary | ICD-10-CM

## 2019-07-18 NOTE — Progress Notes (Addendum)
Temp (!) 96.9 F (36.1 C)   LMP 09/28/2008 (Approximate)    Subjective:    Patient ID: Diane Pearson, female    DOB: 1956/01/05, 64 y.o.   MRN: [id number]  HPI: Diane Pearson is a 64 y.o. female  Chief Complaint  Patient presents with  . Insomnia  . Medication concerns    Lyrica larger dose is making her feel worse.   . Rash  . Constipation   FIBROMYALGIA- not tolerating the lyrica well. Has been making her very tired. No better on the higher dose. Seeing the pain management. She would like to go back to the BID dosing Pain status: stable Satisfied with current treatment?: no Medication side effects: yes Medication compliance: excellent compliance Duration: chronic Location: widespread Quality: aching and sore Current pain level: moderate Previous pain level: moderate Aggravating factors: certain movements Alleviating factors: medication Previous pain specialty evaluation: yes Non-narcotic analgesic meds: yes Narcotic contract:no  RASH- since starting the nabumetone Duration:  weeks  Location: widespread  Itching: yes Burning: no Redness: yes Oozing: no Scaling: no Blisters: no Painful: no Fevers: no Change in detergents/soaps/personal care products: no Recent illness: no Recent travel:no History of same: no Context: stable Alleviating factors: nothing Treatments attempted:nothing Shortness of breath: no  Throat/tongue swelling: no Myalgias/arthralgias: no  INSOMNIA- continues to do well with the ambien. No concerns.  Duration: chronic Satisfied with sleep quality: yes Difficulty falling asleep: no Difficulty staying asleep: no Waking a few hours after sleep onset: no Early morning awakenings: no Daytime hypersomnolence: no Wakes feeling refreshed: no Good sleep hygiene: yes Apnea: no Snoring: no Depressed/anxious mood: no Recent stress: no Restless legs/nocturnal leg cramps: yes Chronic pain/arthritis: yes Treatments attempted: melatonin,  uinsom, benadryl and ambien   Relevant past medical, surgical, family and social history reviewed and updated as indicated. Interim medical history since our last visit reviewed. Allergies and medications reviewed and updated.  Review of Systems  Constitutional: Negative.   Respiratory: Negative.   Cardiovascular: Negative.   Gastrointestinal: Negative.   Musculoskeletal: Positive for arthralgias, back pain and myalgias. Negative for gait problem, joint swelling, neck pain and neck stiffness.  Skin: Positive for rash. Negative for color change, pallor and wound.  Neurological: Negative.   Psychiatric/Behavioral: Negative.     Per HPI unless specifically indicated above     Objective:    Temp (!) 96.9 F (36.1 C)   LMP 09/28/2008 (Approximate)   Wt Readings from Last 3 Encounters:  01/09/19 168 lb (76.2 kg)  12/06/18 170 lb 8 oz (77.3 kg)  10/23/18 168 lb (76.2 kg)    Physical Exam Vitals and nursing note reviewed.  Constitutional:      General: She is not in acute distress.    Appearance: Normal appearance. She is not ill-appearing, toxic-appearing or diaphoretic.  HENT:     Head: Normocephalic and atraumatic.     Right Ear: External ear normal.     Left Ear: External ear normal.     Nose: Nose normal.     Mouth/Throat:     Mouth: Mucous membranes are moist.     Pharynx: Oropharynx is clear.  Eyes:     General: No scleral icterus.       Right eye: No discharge.        Left eye: No discharge.     Extraocular Movements: Extraocular movements intact.     Conjunctiva/sclera: Conjunctivae normal.     Pupils: Pupils are equal, round, and reactive to light.  Cardiovascular:  Rate and Rhythm: Normal rate and regular rhythm.     Pulses: Normal pulses.     Heart sounds: Normal heart sounds. No murmur heard.  No friction rub. No gallop.   Pulmonary:     Effort: Pulmonary effort is normal. No respiratory distress.     Breath sounds: Normal breath sounds. No stridor. No  wheezing, rhonchi or rales.  Chest:     Chest wall: No tenderness.  Musculoskeletal:        General: Normal range of motion.     Cervical back: Normal range of motion and neck supple.  Skin:    General: Skin is warm and dry.     Capillary Refill: Capillary refill takes less than 2 seconds.     Coloration: Skin is not jaundiced or pale.     Findings: No bruising, erythema, lesion or rash.  Neurological:     General: No focal deficit present.     Mental Status: She is alert and oriented to person, place, and time. Mental status is at baseline.  Psychiatric:        Mood and Affect: Mood normal.        Behavior: Behavior normal.        Thought Content: Thought content normal.        Judgment: Judgment normal.     Results for orders placed or performed in visit on 01/10/19  Microscopic Examination   URINE  Result Value Ref Range   WBC, UA 0-5 0 - 5 /hpf   RBC 3-10 (A) 0 - 2 /hpf   Epithelial Cells (non renal) 0-10 0 - 10 /hpf   Bacteria, UA Moderate (A) None seen/Few  Urine Culture, Reflex   URINE  Result Value Ref Range   Urine Culture, Routine Final report    Organism ID, Bacteria Comment   UA/M w/rflx Culture, Routine   Specimen: Urine   URINE  Result Value Ref Range   Specific Gravity, UA 1.025 1.005 - 1.030   pH, UA 6.0 5.0 - 7.5   Color, UA Yellow Yellow   Appearance Ur Clear Clear   Leukocytes,UA Negative Negative   Protein,UA Negative Negative/Trace   Glucose, UA Negative Negative   Ketones, UA Negative Negative   RBC, UA 2+ (A) Negative   Bilirubin, UA Negative Negative   Urobilinogen, Ur 0.2 0.2 - 1.0 mg/dL   Nitrite, UA Negative Negative   Microscopic Examination See below:    Urinalysis Reflex Comment   TSH  Result Value Ref Range   TSH 3.500 0.450 - 4.500 uIU/mL  Lipid Panel w/o Chol/HDL Ratio OUT  Result Value Ref Range   Cholesterol, Total 158 100 - 199 mg/dL   Triglycerides 182 (H) 0 - 149 mg/dL   HDL 38 (L) >39 mg/dL   VLDL Cholesterol Cal 31 5  - 40 mg/dL   LDL Chol Calc (NIH) 89 0 - 99 mg/dL  Comp Met (CMET)  Result Value Ref Range   Glucose 99 65 - 99 mg/dL   BUN 20 8 - 27 mg/dL   Creatinine, Ser 0.88 0.57 - 1.00 mg/dL   GFR calc non Af Amer 71 >59 mL/min/1.73   GFR calc Af Amer 81 >59 mL/min/1.73   BUN/Creatinine Ratio 23 12 - 28   Sodium 141 134 - 144 mmol/L   Potassium 4.1 3.5 - 5.2 mmol/L   Chloride 103 96 - 106 mmol/L   CO2 26 20 - 29 mmol/L   Calcium 9.3 8.7 - 10.3 mg/dL  Total Protein 6.6 6.0 - 8.5 g/dL   Albumin 3.7 (L) 3.8 - 4.8 g/dL   Globulin, Total 2.9 1.5 - 4.5 g/dL   Albumin/Globulin Ratio 1.3 1.2 - 2.2   Bilirubin Total <0.2 0.0 - 1.2 mg/dL   Alkaline Phosphatase 88 39 - 117 IU/L   AST 17 0 - 40 IU/L   ALT 14 0 - 32 IU/L  CBC with Differential OUT  Result Value Ref Range   WBC 7.4 3.4 - 10.8 x10E3/uL   RBC 3.60 (L) 3.77 - 5.28 x10E6/uL   Hemoglobin 11.4 11.1 - 15.9 g/dL   Hematocrit 33.9 (L) 34.0 - 46.6 %   MCV 94 79 - 97 fL   MCH 31.7 26.6 - 33.0 pg   MCHC 33.6 31 - 35 g/dL   RDW 12.1 11.7 - 15.4 %   Platelets 248 150 - 450 x10E3/uL   Neutrophils 56 Not Estab. %   Lymphs 31 Not Estab. %   Monocytes 7 Not Estab. %   Eos 5 Not Estab. %   Basos 1 Not Estab. %   Neutrophils Absolute 4.1 1 - 7 x10E3/uL   Lymphocytes Absolute 2.3 0 - 3 x10E3/uL   Monocytes Absolute 0.5 0 - 0 x10E3/uL   EOS (ABSOLUTE) 0.4 0.0 - 0.4 x10E3/uL   Basophils Absolute 0.1 0 - 0 x10E3/uL   Immature Granulocytes 0 Not Estab. %   Immature Grans (Abs) 0.0 0.0 - 0.1 x10E3/uL      Assessment & Plan:   Problem List Items Addressed This Visit      Nervous and Auditory   Benign essential tremor    Stable. Continue to monitor. Call with any concerns.         Other   Insomnia    Under good control on current regimen. Continue current regimen. Continue to monitor. Call with any concerns. Refills given. Recheck 6 months.        Symptomatic menopausal or female climacteric states    Under good control on current  regimen. Continue current regimen. Continue to monitor. Call with any concerns. Refills given.        Fibromyalgia - Primary    Not tolerating her higher dose of lyrica. Will go back to BID dosing and will follow up with pain management as needed. Call with any concerns.       Relevant Medications   nabumetone (RELAFEN) 500 MG tablet   pregabalin (LYRICA) 100 MG capsule   DULoxetine (CYMBALTA) 60 MG capsule   cyclobenzaprine (FLEXERIL) 10 MG tablet    Other Visit Diagnoses    Rash       Likely due to nambutone. Will stop and see if her rash resolves. If gone, will contact pain managment for an alternative.    Chronic pain of left knee       Would like to see ortho. Referral generated today.   Relevant Medications   nabumetone (RELAFEN) 500 MG tablet   pregabalin (LYRICA) 100 MG capsule   DULoxetine (CYMBALTA) 60 MG capsule   cyclobenzaprine (FLEXERIL) 10 MG tablet   Other Relevant Orders   Ambulatory referral to Orthopedic Surgery   Numerous moles       Needs referral to get back to see them. Referral given today.   Relevant Orders   Ambulatory referral to Dermatology       Follow up plan: Return in about 6 months (around 01/17/2020) for Physical.    . This visit was completed via MyChart due to the  restrictions of the COVID-19 pandemic. All issues as above were discussed and addressed. Physical exam was done as above through visual confirmation on MyChart. If it was felt that the patient should be evaluated in the office, they were directed there. The patient verbally consented to this visit. . Location of the patient: home . Location of the provider: work . Those involved with this call:  . Provider: Park Liter, DO . CMA: Tiffany Reel, CMA . Front Desk/Registration: Don Perking  . Time spent on call: 25 minutes with patient face to face via video conference. More than 50% of this time was spent in counseling and coordination of care. 40 minutes total spent in  review of patient's record and preparation of their chart.

## 2019-07-18 NOTE — Patient Instructions (Signed)
Prempro- start taking 1 tab daily for 3 days then skip a day for 1 month, then cut down to 1 tab daily for 2 days then skip a day for 1 month, then 1 tab every other day for a month, then every 3rd day for a month then stop

## 2019-07-20 ENCOUNTER — Ambulatory Visit: Payer: Medicare Other | Admitting: Urology

## 2019-07-20 ENCOUNTER — Encounter: Payer: Self-pay | Admitting: Family Medicine

## 2019-07-25 ENCOUNTER — Encounter: Payer: Self-pay | Admitting: Family Medicine

## 2019-07-25 MED ORDER — CYCLOBENZAPRINE HCL 10 MG PO TABS: ORAL_TABLET | ORAL | 0 refills | Status: DC

## 2019-07-25 MED ORDER — ZOLPIDEM TARTRATE 10 MG PO TABS: 10.0000 mg | ORAL_TABLET | Freq: Every day | ORAL | 5 refills | Status: DC

## 2019-07-25 MED ORDER — PREGABALIN 100 MG PO CAPS: 100.0000 mg | ORAL_CAPSULE | Freq: Two times a day (BID) | ORAL | 1 refills | Status: DC

## 2019-07-25 MED ORDER — DULOXETINE HCL 60 MG PO CPEP: 60.0000 mg | ORAL_CAPSULE | Freq: Every day | ORAL | 1 refills | Status: DC

## 2019-07-25 MED ORDER — METOPROLOL SUCCINATE ER 50 MG PO TB24: ORAL_TABLET | ORAL | 1 refills | Status: DC

## 2019-07-25 MED ORDER — HYDROXYZINE HCL 25 MG PO TABS: 25.0000 mg | ORAL_TABLET | Freq: Every day | ORAL | 1 refills | Status: DC

## 2019-07-25 NOTE — Assessment & Plan Note (Addendum)
Would like to come off her prempro. Instructions for coming off given today. Call with any concerns.

## 2019-07-25 NOTE — Assessment & Plan Note (Signed)
Not tolerating her higher dose of lyrica. Will go back to BID dosing and will follow up with pain management as needed. Call with any concerns.

## 2019-07-25 NOTE — Assessment & Plan Note (Signed)
Stable. Continue to monitor. Call with any concerns.  ?

## 2019-07-25 NOTE — Assessment & Plan Note (Signed)
Under good control on current regimen. Continue current regimen. Continue to monitor. Call with any concerns. Refills given. Recheck 6 months.   

## 2019-07-31 DIAGNOSIS — M4602 Spinal enthesopathy, cervical region: Secondary | ICD-10-CM | POA: Diagnosis not present

## 2019-07-31 DIAGNOSIS — M9901 Segmental and somatic dysfunction of cervical region: Secondary | ICD-10-CM | POA: Diagnosis not present

## 2019-08-10 ENCOUNTER — Telehealth: Payer: Self-pay

## 2019-08-10 NOTE — Telephone Encounter (Signed)
Message left notifying patient that it is time to schedule the low dose lung cancer screening CT scan.  Instructed patient to return call to Shawn Perkins at [phone] to verify information prior to CT scan being scheduled.    

## 2019-08-13 ENCOUNTER — Encounter: Payer: Self-pay | Admitting: Family Medicine

## 2019-08-14 NOTE — Telephone Encounter (Signed)
Spoke with pt, advised ehr to keep trying to reach Pain Specialist regarding medication. Pt stated upon self examination it is not a spider bite and she does not need to be seen at this time.

## 2019-08-14 NOTE — Telephone Encounter (Signed)
Please let patient know that that medication would have to be up to her pain specialist.  I would recommend an appointment to have the spider bite evaluated and to determine if she needs treatment for that.

## 2019-08-29 ENCOUNTER — Encounter: Payer: Self-pay | Admitting: Family Medicine

## 2019-09-03 ENCOUNTER — Telehealth: Payer: Self-pay

## 2019-09-03 NOTE — Telephone Encounter (Signed)
Message left notifying patient that it is time to schedule the low dose lung cancer screening CT scan.  Instructed patient to return call to Shawn Perkins at [phone] to verify information prior to CT scan being scheduled.    

## 2019-09-11 ENCOUNTER — Telehealth: Payer: Self-pay | Admitting: *Deleted

## 2019-09-11 DIAGNOSIS — Z87891 Personal history of nicotine dependence: Secondary | ICD-10-CM

## 2019-09-11 DIAGNOSIS — Z122 Encounter for screening for malignant neoplasm of respiratory organs: Secondary | ICD-10-CM

## 2019-09-11 NOTE — Telephone Encounter (Signed)
Left message for patient to notify them that it is time to schedule annual low dose lung cancer screening CT scan. Instructed patient to call back to verify information prior to the scan being scheduled.  

## 2019-09-18 NOTE — Addendum Note (Signed)
Addended by: Lieutenant Diego on: 09/18/2019 09:26 AM   Modules accepted: Orders

## 2019-09-18 NOTE — Telephone Encounter (Signed)
Contacted and scheduled 

## 2019-09-23 IMAGING — CT CT CHEST LUNG CANCER SCREENING LOW DOSE
2 of 5 series · 15 of 40 positions shown, 18 images · non-contrast
Comparison: 06/09/2017.

CLINICAL DATA: Lung cancer screening. Forty-six pack-year history.
Current asymptomatic smoker.

EXAM:
CT CHEST WITHOUT CONTRAST LOW-DOSE FOR LUNG CANCER SCREENING
TECHNIQUE: Multidetector CT imaging of the chest was performed following the
standard protocol without IV contrast.

[Series 3: lung · axial · 0.61mm/px · z∈[-1256,-965]mm · 12 of 325 slices shown, 15 images]
[im 17/325  mediastinal]
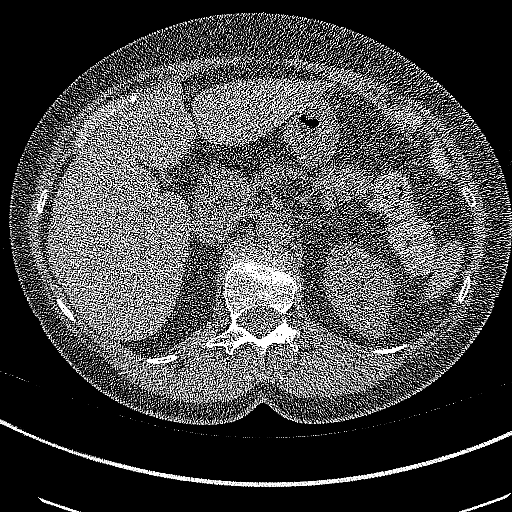
[im 17/325  lung]
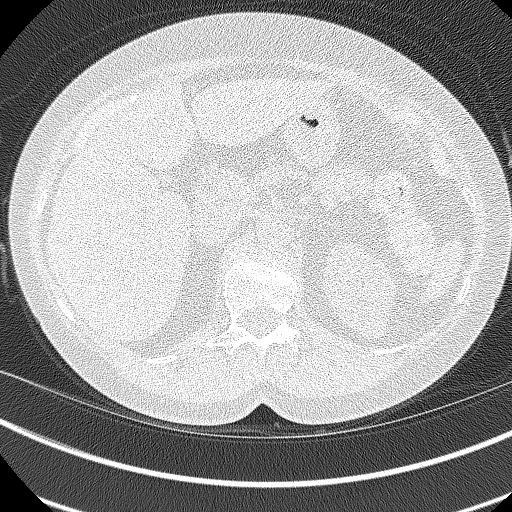
[im 49/325  lung]
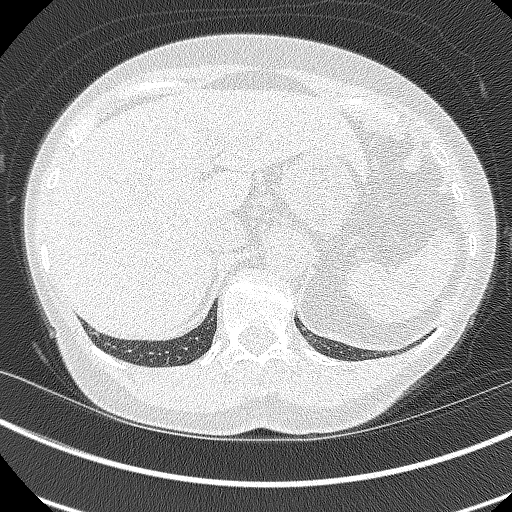
[im 65/325  lung]
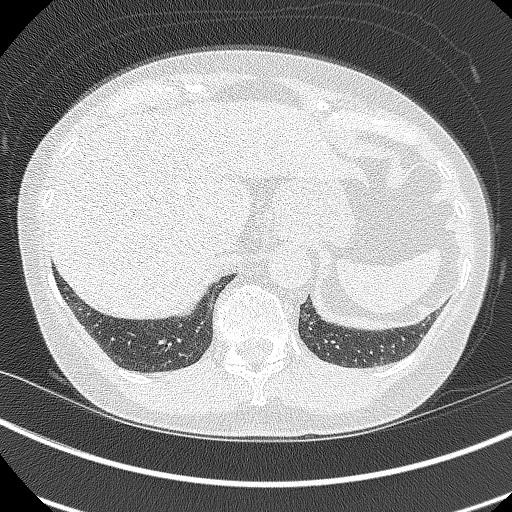
[im 98/325  lung]
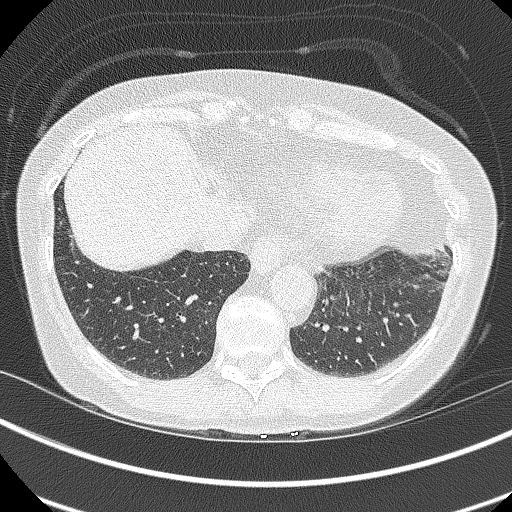
[im 130/325  mediastinal]
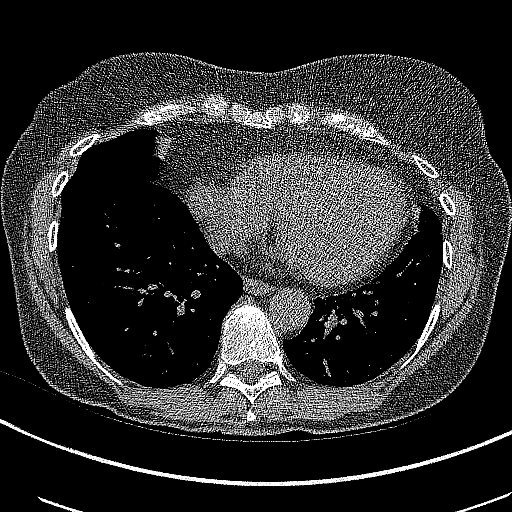
[im 130/325  lung]
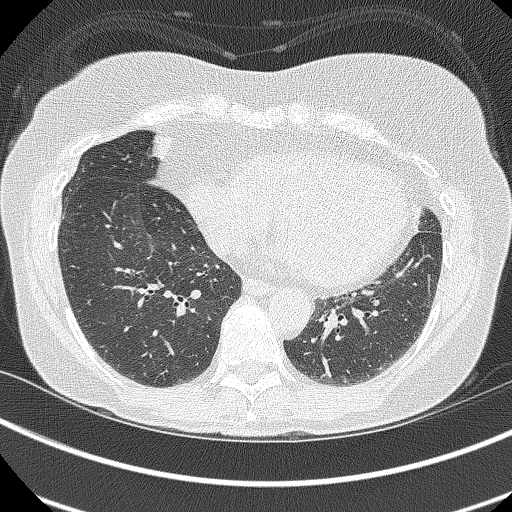
[im 146/325  lung]
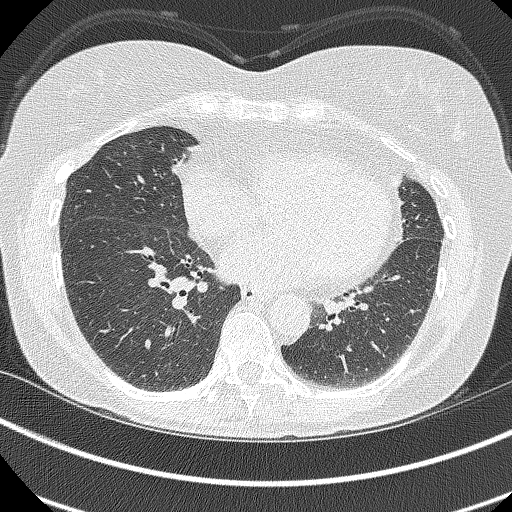
[im 179/325  lung]
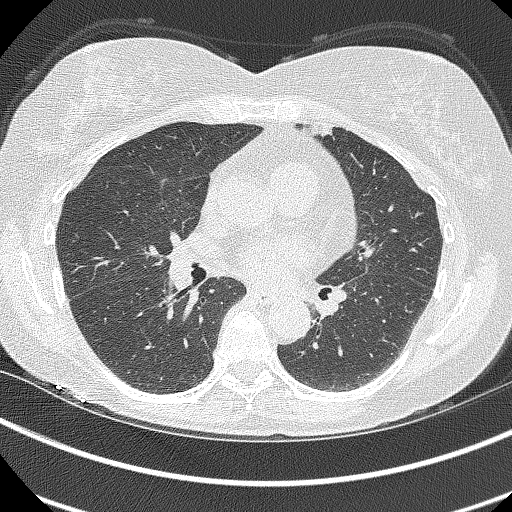
[im 195/325  lung]
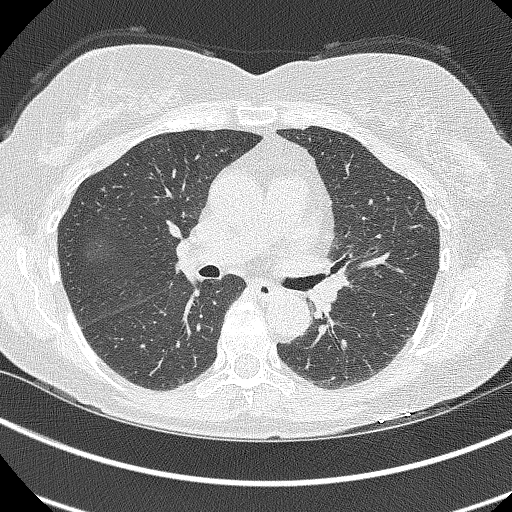
[im 227/325  mediastinal]
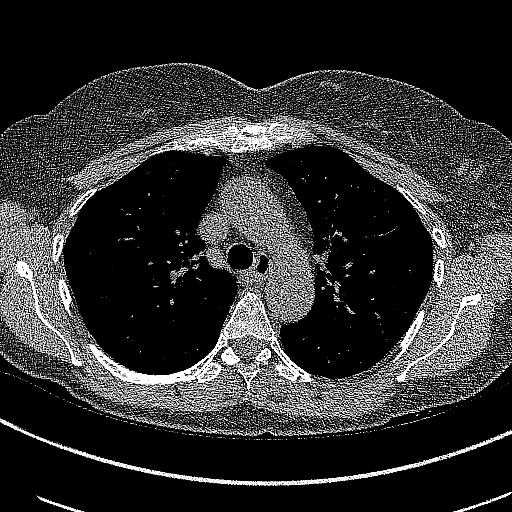
[im 227/325  lung]
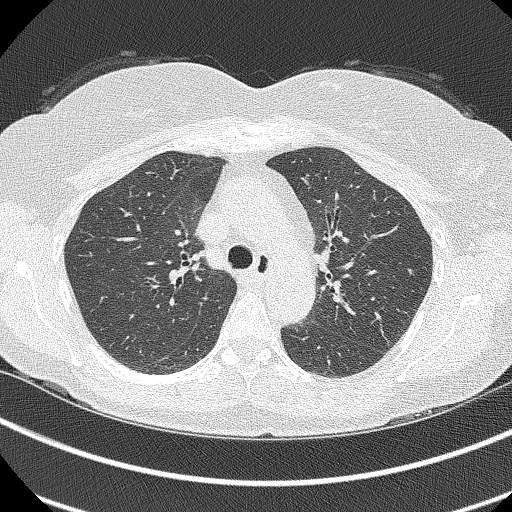
[im 260/325  lung]
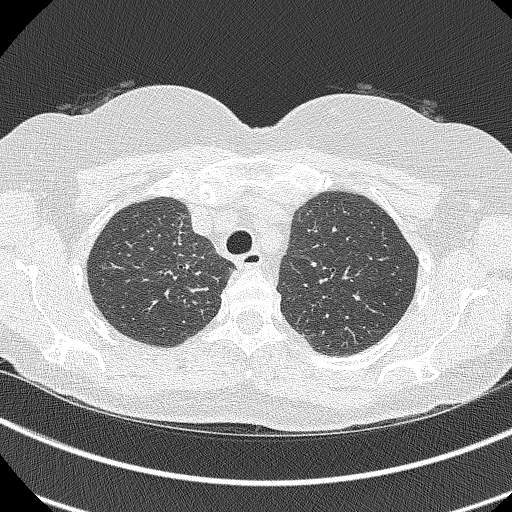
[im 276/325  lung]
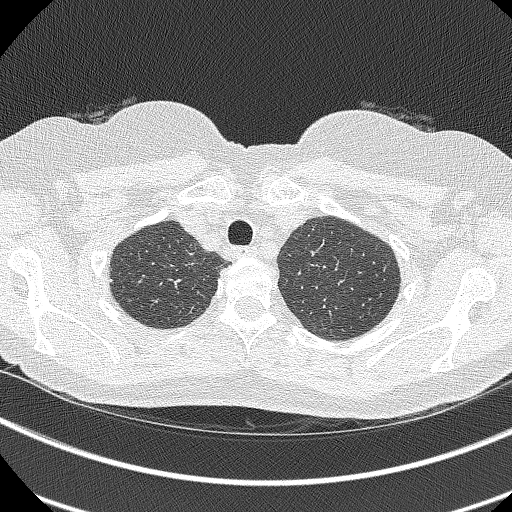
[im 308/325  lung]
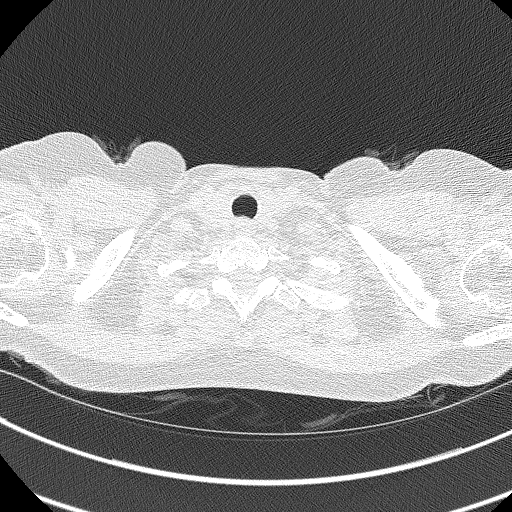

[Series 4: coronal lung · coronal · 0.61mm/px · 3 of 265 slices shown]
[im 53/265  lung]
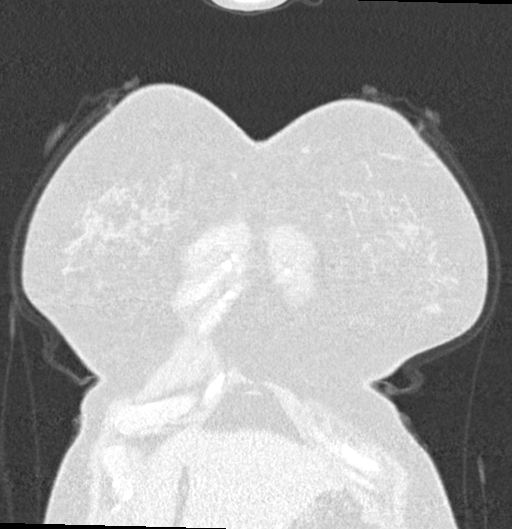
[im 106/265  lung]
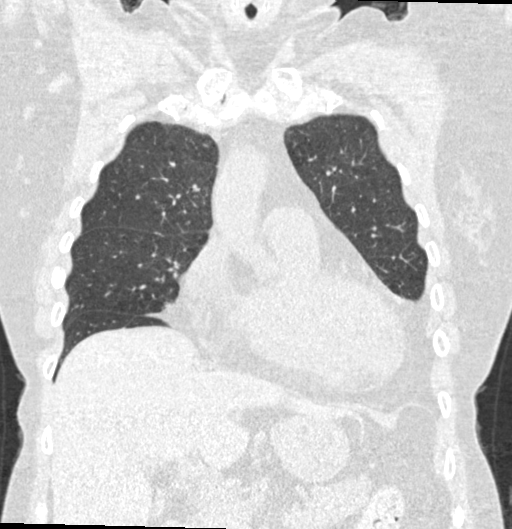
[im 159/265  lung]
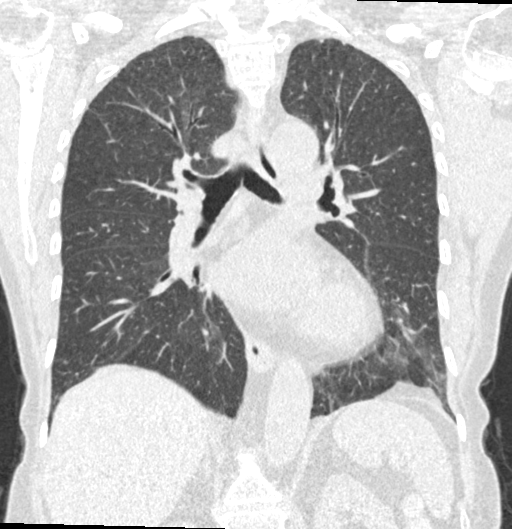

[15 of 40 positions shown; findings below may reference images not displayed]

FINDINGS: Cardiovascular: Heart size is normal. No pericardial effusion.
Calcifications within the LAD coronary artery noted.

Mediastinum/Nodes: Normal appearance of the thyroid gland. The
trachea appears patent and is midline. Small hiatal hernia.

No enlarged mediastinal or hilar lymph nodes.

Lungs/Pleura: Mild emphysema with bronchial wall thickening. No
pleural effusion, airspace consolidation or atelectasis. A few small
tiny scattered nodules are identified bilaterally. The appearance is
not significantly changed when compared with previous exam. The
largest nodule is in the posteromedial right lower lobe with an
equivalent diameter 4.5 mm.

Upper Abdomen: No acute abnormality.

Musculoskeletal: No acute or suspicious osseous abnormality. T5
sclerotic lesion is unchanged and favored to represent a benign bone
island.
IMPRESSION: 1. Lung-RADS 2, benign appearance or behavior. Continue annual
screening with low-dose chest CT without contrast in 12 months.
2. Aortic Atherosclerosis (I4UOI-WBW.W) and Emphysema ([id number].S).
3. Coronary artery calcifications.

## 2019-09-24 ENCOUNTER — Other Ambulatory Visit: Payer: Self-pay

## 2019-09-24 ENCOUNTER — Ambulatory Visit
Admission: RE | Admit: 2019-09-24 | Discharge: 2019-09-24 | Disposition: A | Discharge status: Home / Self Care | Payer: Medicare Other | Source: Ambulatory Visit | Attending: Nurse Practitioner | Admitting: Nurse Practitioner

## 2019-09-24 DIAGNOSIS — Z122 Encounter for screening for malignant neoplasm of respiratory organs: Secondary | ICD-10-CM

## 2019-09-24 DIAGNOSIS — Z87891 Personal history of nicotine dependence: Secondary | ICD-10-CM

## 2019-09-24 DIAGNOSIS — F17210 Nicotine dependence, cigarettes, uncomplicated: Secondary | ICD-10-CM | POA: Diagnosis not present

## 2019-09-27 ENCOUNTER — Encounter: Payer: Self-pay | Admitting: *Deleted

## 2019-09-28 DIAGNOSIS — M797 Fibromyalgia: Secondary | ICD-10-CM | POA: Diagnosis not present

## 2019-09-28 DIAGNOSIS — M1990 Unspecified osteoarthritis, unspecified site: Secondary | ICD-10-CM | POA: Diagnosis not present

## 2019-09-28 DIAGNOSIS — M7918 Myalgia, other site: Secondary | ICD-10-CM | POA: Diagnosis not present

## 2019-10-08 ENCOUNTER — Ambulatory Visit (INDEPENDENT_AMBULATORY_CARE_PROVIDER_SITE_OTHER): Payer: Medicare Other

## 2019-10-08 VITALS — Temp 96.9°F | Ht 66.0 in | Wt 170.0 lb

## 2019-10-08 DIAGNOSIS — Z0000 Encounter for general adult medical examination without abnormal findings: Secondary | ICD-10-CM | POA: Diagnosis not present

## 2019-10-08 NOTE — Patient Instructions (Signed)
Diane Pearson , Thank you for taking time to come for your Medicare Wellness Visit. I appreciate your ongoing commitment to your health goals. Please review the following plan we discussed and let me know if I can assist you in the future.   Screening recommendations/referrals: Colonoscopy: complete 09/29/2018 Mammogram: 07/16/2019 Bone Density: completed 07/28/2004 Recommended yearly ophthalmology/optometry visit for glaucoma screening and checkup Recommended yearly dental visit for hygiene and checkup  Vaccinations: Influenza vaccine: decline Pneumococcal vaccine: completed 01/14/2014 Tdap vaccine: due Shingles vaccine: discussed  Covid-19: 06/06/2019, 05/10/2019  Advanced directives: Advance directive discussed with you today.  Conditions/risks identified: smoking  Next appointment: Follow up in one year for your annual wellness visit.   Preventive Care 40-64 Years, Female Preventive care refers to lifestyle choices and visits with your health care provider that can promote health and wellness. What does preventive care include?  A yearly physical exam. This is also called an annual well check.  Dental exams once or twice a year.  Routine eye exams. Ask your health care provider how often you should have your eyes checked.  Personal lifestyle choices, including:  Daily care of your teeth and gums.  Regular physical activity.  Eating a healthy diet.  Avoiding tobacco and drug use.  Limiting alcohol use.  Practicing safe sex.  Taking low-dose aspirin daily starting at age 45.  Taking vitamin and mineral supplements as recommended by your health care provider. What happens during an annual well check? The services and screenings done by your health care provider during your annual well check will depend on your age, overall health, lifestyle risk factors, and family history of disease. Counseling  Your health care provider may ask you questions about your:  Alcohol  use.  Tobacco use.  Drug use.  Emotional well-being.  Home and relationship well-being.  Sexual activity.  Eating habits.  Work and work Statistician.  Method of birth control.  Menstrual cycle.  Pregnancy history. Screening  You may have the following tests or measurements:  Height, weight, and BMI.  Blood pressure.  Lipid and cholesterol levels. These may be checked every 5 years, or more frequently if you are over 83 years old.  Skin check.  Lung cancer screening. You may have this screening every year starting at age 69 if you have a 30-pack-year history of smoking and currently smoke or have quit within the past 15 years.  Fecal occult blood test (FOBT) of the stool. You may have this test every year starting at age 52.  Flexible sigmoidoscopy or colonoscopy. You may have a sigmoidoscopy every 5 years or a colonoscopy every 10 years starting at age 50.  Hepatitis C blood test.  Hepatitis B blood test.  Sexually transmitted disease (STD) testing.  Diabetes screening. This is done by checking your blood sugar (glucose) after you have not eaten for a while (fasting). You may have this done every 1-3 years.  Mammogram. This may be done every 1-2 years. Talk to your health care provider about when you should start having regular mammograms. This may depend on whether you have a family history of breast cancer.  BRCA-related cancer screening. This may be done if you have a family history of breast, ovarian, tubal, or peritoneal cancers.  Pelvic exam and Pap test. This may be done every 3 years starting at age 32. Starting at age 5, this may be done every 5 years if you have a Pap test in combination with an HPV test.  Bone density scan.  This is done to screen for osteoporosis. You may have this scan if you are at high risk for osteoporosis. Discuss your test results, treatment options, and if necessary, the need for more tests with your health care  provider. Vaccines  Your health care provider may recommend certain vaccines, such as:  Influenza vaccine. This is recommended every year.  Tetanus, diphtheria, and acellular pertussis (Tdap, Td) vaccine. You may need a Td booster every 10 years.  Zoster vaccine. You may need this after age 16.  Pneumococcal 13-valent conjugate (PCV13) vaccine. You may need this if you have certain conditions and were not previously vaccinated.  Pneumococcal polysaccharide (PPSV23) vaccine. You may need one or two doses if you smoke cigarettes or if you have certain conditions. Talk to your health care provider about which screenings and vaccines you need and how often you need them. This information is not intended to replace advice given to you by your health care provider. Make sure you discuss any questions you have with your health care provider. Document Released: 02/21/2015 Document Revised: 10/15/2015 Document Reviewed: 11/26/2014 Elsevier Interactive Patient Education  2017 [employer] Prevention in the Home Falls can cause injuries. They can happen to people of all ages. There are many things you can do to make your home safe and to help prevent falls. What can I do on the outside of my home?  Regularly fix the edges of walkways and driveways and fix any cracks.  Remove anything that might make you trip as you walk through a door, such as a raised step or threshold.  Trim any bushes or trees on the path to your home.  Use bright outdoor lighting.  Clear any walking paths of anything that might make someone trip, such as rocks or tools.  Regularly check to see if handrails are loose or broken. Make sure that both sides of any steps have handrails.  Any raised decks and porches should have guardrails on the edges.  Have any leaves, snow, or ice cleared regularly.  Use sand or salt on walking paths during winter.  Clean up any spills in your garage right away. This includes  oil or grease spills. What can I do in the bathroom?  Use night lights.  Install grab bars by the toilet and in the tub and shower. Do not use towel bars as grab bars.  Use non-skid mats or decals in the tub or shower.  If you need to sit down in the shower, use a plastic, non-slip stool.  Keep the floor dry. Clean up any water that spills on the floor as soon as it happens.  Remove soap buildup in the tub or shower regularly.  Attach bath mats securely with double-sided non-slip rug tape.  Do not have throw rugs and other things on the floor that can make you trip. What can I do in the bedroom?  Use night lights.  Make sure that you have a light by your bed that is easy to reach.  Do not use any sheets or blankets that are too big for your bed. They should not hang down onto the floor.  Have a firm chair that has side arms. You can use this for support while you get dressed.  Do not have throw rugs and other things on the floor that can make you trip. What can I do in the kitchen?  Clean up any spills right away.  Avoid walking on wet floors.  Keep items that you use a lot in easy-to-reach places.  If you need to reach something above you, use a strong step stool that has a grab bar.  Keep electrical cords out of the way.  Do not use floor polish or wax that makes floors slippery. If you must use wax, use non-skid floor wax.  Do not have throw rugs and other things on the floor that can make you trip. What can I do with my stairs?  Do not leave any items on the stairs.  Make sure that there are handrails on both sides of the stairs and use them. Fix handrails that are broken or loose. Make sure that handrails are as long as the stairways.  Check any carpeting to make sure that it is firmly attached to the stairs. Fix any carpet that is loose or worn.  Avoid having throw rugs at the top or bottom of the stairs. If you do have throw rugs, attach them to the floor  with carpet tape.  Make sure that you have a light switch at the top of the stairs and the bottom of the stairs. If you do not have them, ask someone to add them for you. What else can I do to help prevent falls?  Wear shoes that:  Do not have high heels.  Have rubber bottoms.  Are comfortable and fit you well.  Are closed at the toe. Do not wear sandals.  If you use a stepladder:  Make sure that it is fully opened. Do not climb a closed stepladder.  Make sure that both sides of the stepladder are locked into place.  Ask someone to hold it for you, if possible.  Clearly mark and make sure that you can see:  Any grab bars or handrails.  First and last steps.  Where the edge of each step is.  Use tools that help you move around (mobility aids) if they are needed. These include:  Canes.  Walkers.  Scooters.  Crutches.  Turn on the lights when you go into a dark area. Replace any light bulbs as soon as they burn out.  Set up your furniture so you have a clear path. Avoid moving your furniture around.  If any of your floors are uneven, fix them.  If there are any pets around you, be aware of where they are.  Review your medicines with your doctor. Some medicines can make you feel dizzy. This can increase your chance of falling. Ask your doctor what other things that you can do to help prevent falls. This information is not intended to replace advice given to you by your health care provider. Make sure you discuss any questions you have with your health care provider. Document Released: 11/21/2008 Document Revised: 07/03/2015 Document Reviewed: 03/01/2014 Elsevier Interactive Patient Education  2017 [employer].

## 2019-10-08 NOTE — Progress Notes (Signed)
I connected with Diane Pearson today by telephone and verified that I am speaking with the correct person using two identifiers. Location patient: home Location provider: work Persons participating in the virtual visit: Maud Rubendall, Glenna Durand LPN.   I discussed the limitations, risks, security and privacy concerns of performing an evaluation and management service by telephone and the availability of in person appointments. I also discussed with the patient that there may be a patient responsible charge related to this service. The patient expressed understanding and verbally consented to this telephonic visit.    Interactive audio and video telecommunications were attempted between this provider and patient, however failed, due to patient having technical difficulties OR patient did not have access to video capability.  We continued and completed visit with audio only.    Vital signs my be patient reported or missing.  Subjective:   Diane Pearson is a 64 y.o. female who presents for Medicare Annual (Subsequent) preventive examination.  Review of Systems     Cardiac Risk Factors include: dyslipidemia;smoking/ tobacco exposure;sedentary lifestyle     Objective:    Today's Vitals   10/08/19 0811  Weight: 170 lb (77.1 kg)  Height: 5\' 6"  (1.676 m)   Body mass index is 27.44 kg/m.  Advanced Directives 10/08/2019 09/29/2018 09/27/2018 11/29/2017  Does Patient Have a Medical Advance Directive? No No No No  Would patient like information on creating a medical advance directive? - No - Patient declined - No - Patient declined    Current Medications (verified) Outpatient Encounter Medications as of 10/08/2019  Medication Sig  . calcium carbonate (OS-CAL - DOSED IN MG OF ELEMENTAL CALCIUM) 1250 (500 Ca) MG tablet Take 1 tablet by mouth.  . cholecalciferol (VITAMIN D) 1000 UNITS tablet Take 1,000 Units by mouth daily.  . cyclobenzaprine (FLEXERIL) 10 MG tablet TAKE ONE TABLET BY MOUTH  THREE TIMES A DAY FOR SPASMS  . diclofenac (VOLTAREN) 50 MG EC tablet Take 50 mg by mouth 2 (two) times daily.  . diclofenac sodium (VOLTAREN) 1 % GEL Apply 2 g topically 4 (four) times daily.  . DULoxetine (CYMBALTA) 60 MG capsule Take 1 capsule (60 mg total) by mouth daily.  . famotidine (PEPCID) 10 MG tablet Take 10 mg by mouth at bedtime.  . hydrOXYzine (ATARAX/VISTARIL) 25 MG tablet Take 1 tablet (25 mg total) by mouth daily.  Marland Kitchen levothyroxine (SYNTHROID) 50 MCG tablet TAKE ONE TABLET BY MOUTH EVERY MORNING 30 MINUTES BEFORE BREAKFAST  . lidocaine (XYLOCAINE) 5 % ointment Apply 1 application topically 3 (three) times daily as needed.   . Magnesium 400 MG TABS Take 400 mg by mouth daily.  . metoprolol succinate (TOPROL-XL) 50 MG 24 hr tablet TAKE ONE TABLET BY MOUTH DAILY WITH OR IMMEDIATELY FOLLOWING A MEAL  . Multiple Vitamins-Minerals (CENTRUM SILVER ADULT 50+ PO) Take 1 tablet by mouth daily.  . Potassium 99 MG TABS Take 99 mg by mouth daily.  . pregabalin (LYRICA) 100 MG capsule Take 1 capsule (100 mg total) by mouth 2 (two) times daily.  Marland Kitchen PREMPRO 0.625-2.5 MG tablet TAKE ONE TABLET BY MOUTH DAILY  . vitamin B-12 (CYANOCOBALAMIN) 500 MCG tablet Take 500 mcg by mouth daily.  Marland Kitchen VITAMIN E PO Take by mouth daily.  Marland Kitchen zolpidem (AMBIEN) 10 MG tablet Take 1 tablet (10 mg total) by mouth at bedtime.  . nabumetone (RELAFEN) 500 MG tablet Take 500 mg by mouth 2 (two) times daily as needed. (Patient not taking: Reported on 10/08/2019)   No  facility-administered encounter medications on file as of 10/08/2019.    Allergies (verified) Bactrim [sulfamethoxazole-trimethoprim], Erythromycin, Gabapentin, Tamiflu [oseltamivir phosphate], Tramadol, and Nitrofurantoin   History: Past Medical History:  Diagnosis Date  . Arthritis   . CAD (coronary artery disease)    Diagnosed on CT scan  . Depression   . Depression   . Hypothyroidism   . Insomnia   . Menopause   . Osteoporosis   . Tremors of  nervous system    Past Surgical History:  Procedure Laterality Date  . BREAST BIOPSY Left 04/20/2016   COLUMNAR CELL CHANGE  . COLONOSCOPY WITH PROPOFOL N/A 09/29/2018   Procedure: COLONOSCOPY WITH PROPOFOL;  Surgeon: [name], MD;  Location: [facility] ENDOSCOPY;  Service: Endoscopy;  Laterality: N/A;  . varicose veins  1998   Family History  Problem Relation Age of Onset  . Cancer Father        Lung  . Tuberculosis Father   . Breast cancer Maternal Grandmother 22  . Ulcerative colitis Sister   . Anxiety disorder Daughter   . Alzheimer's disease Maternal Grandfather   . Heart disease Paternal Grandmother        MI  . Heart disease Paternal Grandfather        MI  . Breast cancer Maternal Aunt        70's   Social History   Socioeconomic History  . Marital status: Divorced    Spouse name: Not on file  . Number of children: 1  . Years of education: Not on file  . Highest education level: 12th grade  Occupational History  . Occupation: disability   Tobacco Use  . Smoking status: Current Every Day Smoker    Packs/day: 1.00    Years: 45.00    Pack years: 45.00    Types: Cigarettes  . Smokeless tobacco: Never Used  Vaping Use  . Vaping Use: Never used  Substance and Sexual Activity  . Alcohol use: No    Alcohol/week: 0.0 standard drinks  . Drug use: No  . Sexual activity: Not Currently  Other Topics Concern  . Not on file  Social History Narrative  . Not on file   Social Determinants of Health   Financial Resource Strain: Medium Risk  . Difficulty of Paying Living Expenses: Somewhat hard  Food Insecurity: No Food Insecurity  . Worried About Charity fundraiser in the Last Year: Never true  . Ran Out of Food in the Last Year: Never true  Transportation Needs: No Transportation Needs  . Lack of Transportation (Medical): No  . Lack of Transportation (Non-Medical): No  Physical Activity: Inactive  . Days of Exercise per Week: 0 days  . Minutes of Exercise  per Session: 0 min  Stress: No Stress Concern Present  . Feeling of Stress : Not at all  Social Connections:   . Frequency of Communication with Friends and Family: Not on file  . Frequency of Social Gatherings with Friends and Family: Not on file  . Attends Religious Services: Not on file  . Active Member of Clubs or Organizations: Not on file  . Attends Archivist Meetings: Not on file  . Marital Status: Not on file    Tobacco Counseling Ready to quit: No Counseling given: Not Answered   Clinical Intake:  Pre-visit preparation completed: Yes  Pain : No/denies pain     Nutritional Status: BMI 25 -29 Overweight Nutritional Risks: None Diabetes: No  How often do you need to  have someone help you when you read instructions, pamphlets, or other written materials from your doctor or pharmacy?: 1 - Never What is the last grade level you completed in school?: 12th grade  Diabetic? no  Interpreter Needed?: No  Information entered by :: NAllen LPN   Activities of Daily Living In your present state of health, do you have any difficulty performing the following activities: 10/08/2019  Hearing? N  Vision? Y  Comment eye sight changes  Difficulty concentrating or making decisions? Y  Walking or climbing stairs? Y  Comment some times can barely walk  Dressing or bathing? N  Doing errands, shopping? N  Preparing Food and eating ? N  Using the Toilet? N  In the past six months, have you accidently leaked urine? Y  Comment with hard cough or sneeze  Do you have problems with loss of bowel control? N  Managing your Medications? N  Managing your Finances? N  Housekeeping or managing your Housekeeping? N  Some recent data might be hidden    Patient Care Team: Valerie Roys, DO as PCP - General (Family Medicine) Rico Junker, RN as Registered Nurse Theodore Demark, RN as Registered Nurse  Indicate any recent Medical Services you may have received from  other than [facility] providers in the past year (date may be approximate).     Assessment:   This is a routine wellness examination for Jaedynn.  Hearing/Vision screen  Hearing Screening   125Hz  250Hz  500Hz  1000Hz  2000Hz  3000Hz  4000Hz  6000Hz  8000Hz   Right ear:           Left ear:           Vision Screening Comments: Regular eye exams, [facility]  Dietary issues and exercise activities discussed: Current Exercise Habits: The patient does not participate in regular exercise at present  Goals    . Patient Stated     10/08/2019, wants to not gain any more weight and maintain      Depression Screen PHQ 2/9 Scores 10/08/2019 05/30/2019 01/09/2019 10/23/2018 10/03/2018 09/27/2018 06/13/2018  PHQ - 2 Score 2 2 1 4 2  0 0  PHQ- 9 Score 8 5 - 11 11 - 5  Exception Documentation - - - - - - -  Not completed - - - - - - -    Fall Risk Fall Risk  10/08/2019 10/03/2018 09/27/2018 11/29/2017 05/30/2017  Falls in the past year? 1 1 0 No Yes  Comment trip over feet - - - -  Number falls in past yr: 0 1 - - 2 or more  Injury with Fall? 0 0 - - No  Risk for fall due to : Impaired balance/gait;Medication side effect History of fall(s) - - -  Follow up Falls evaluation completed;Education provided;Falls prevention discussed - - - Falls evaluation completed    Any stairs in or around the home? Yes  If so, are there any without handrails? No  Home free of loose throw rugs in walkways, pet beds, electrical cords, etc? Yes  Adequate lighting in your home to reduce risk of falls? Yes   ASSISTIVE DEVICES UTILIZED TO PREVENT FALLS:  Life alert? No  Use of a cane, walker or w/c? No  Grab bars in the bathroom? Yes  Shower chair or bench in shower? No  Elevated toilet seat or a handicapped toilet? No   TIMED UP AND GO:  Was the test performed? No .     Cognitive Function:     6CIT Screen  10/08/2019  What Year? 0 points  What month? 0 points  What time? 0 points  Count back from 20 0 points  Months in reverse  0 points  Repeat phrase 0 points  Total Score 0    Immunizations Immunization History  Administered Date(s) Administered  . PFIZER SARS-COV-2 Vaccination 05/10/2019, 06/06/2019  . Pneumococcal Polysaccharide-23 01/14/2014  . Td 02/08/2009    TDAP status: Due, Education has been provided regarding the importance of this vaccine. Advised may receive this vaccine at local pharmacy or Health Dept. Aware to provide a copy of the vaccination record if obtained from local pharmacy or Health Dept. Verbalized acceptance and understanding. Flu Vaccine status: Declined, Education has been provided regarding the importance of this vaccine but patient still declined. Advised may receive this vaccine at local pharmacy or Health Dept. Aware to provide a copy of the vaccination record if obtained from local pharmacy or Health Dept. Verbalized acceptance and understanding. Pneumococcal vaccine status: Up to date Covid-19 vaccine status: Completed vaccines  Qualifies for Shingles Vaccine? Yes   Zostavax completed No   Shingrix Completed?: Yes  Screening Tests Health Maintenance  Topic Date Due  . TETANUS/TDAP  02/09/2019  . INFLUENZA VACCINE  09/09/2019  . MAMMOGRAM  07/15/2021  . PAP SMEAR-Modifier  12/16/2022  . COLONOSCOPY  09/29/2023  . COVID-19 Vaccine  Completed  . Hepatitis C Screening  Completed  . HIV Screening  Completed    Health Maintenance  Health Maintenance Due  Topic Date Due  . TETANUS/TDAP  02/09/2019  . INFLUENZA VACCINE  09/09/2019    Colorectal cancer screening: Completed 09/29/2018. Repeat every 3 years Mammogram status: Completed 07/16/2019. Repeat every year Bone Density status: Completed 07/28/2004.   Lung Cancer Screening: (Low Dose CT Chest recommended if Age 80-80 years, 30 pack-year currently smoking OR have quit w/in 15years.) does qualify.   Lung Cancer Screening Referral: completed 09/24/2019  Additional Screening:  Hepatitis C Screening: does qualify;  Completed 09/25/2014  Vision Screening: Recommended annual ophthalmology exams for early detection of glaucoma and other disorders of the eye. Is the patient up to date with their annual eye exam?  Yes  Who is the provider or what is the name of the office in which the patient attends annual eye exams? [facility] If pt is not established with a provider, would they like to be referred to a provider to establish care? No .   Dental Screening: Recommended annual dental exams for proper oral hygiene  Community Resource Referral / Chronic Care Management: CRR required this visit?  No   CCM required this visit?  No      Plan:     I have personally reviewed and noted the following in the patient's chart:   . Medical and social history . Use of alcohol, tobacco or illicit drugs  . Current medications and supplements . Functional ability and status . Nutritional status . Physical activity . Advanced directives . List of other physicians . Hospitalizations, surgeries, and ER visits in previous 12 months . Vitals . Screenings to include cognitive, depression, and falls . Referrals and appointments  In addition, I have reviewed and discussed with patient certain preventive protocols, quality metrics, and best practice recommendations. A written personalized care plan for preventive services as well as general preventive health recommendations were provided to patient.     Kellie Simmering, LPN   [id number]   Nurse Notes:

## 2019-10-21 DIAGNOSIS — U071 COVID-19: Secondary | ICD-10-CM | POA: Diagnosis not present

## 2019-11-04 ENCOUNTER — Encounter: Payer: Self-pay | Admitting: Family Medicine

## 2019-11-05 MED ORDER — PREGABALIN 100 MG PO CAPS
100.0000 mg | ORAL_CAPSULE | Freq: Two times a day (BID) | ORAL | 1 refills | Status: DC
Start: 2019-11-05 — End: 2020-01-11

## 2019-11-05 MED ORDER — PREMPRO 0.625-2.5 MG PO TABS
1.0000 | ORAL_TABLET | Freq: Every day | ORAL | 1 refills | Status: DC
Start: 2019-11-05 — End: 2019-12-24

## 2019-12-11 DIAGNOSIS — M7918 Myalgia, other site: Secondary | ICD-10-CM | POA: Diagnosis not present

## 2019-12-11 DIAGNOSIS — M797 Fibromyalgia: Secondary | ICD-10-CM | POA: Diagnosis not present

## 2019-12-11 DIAGNOSIS — M1990 Unspecified osteoarthritis, unspecified site: Secondary | ICD-10-CM | POA: Diagnosis not present

## 2019-12-19 ENCOUNTER — Encounter: Payer: Self-pay | Admitting: Family Medicine

## 2019-12-19 DIAGNOSIS — R39198 Other difficulties with micturition: Secondary | ICD-10-CM

## 2019-12-21 ENCOUNTER — Encounter: Payer: Self-pay | Admitting: Family Medicine

## 2019-12-24 ENCOUNTER — Ambulatory Visit (INDEPENDENT_AMBULATORY_CARE_PROVIDER_SITE_OTHER): Payer: Medicare Other | Admitting: Family Medicine

## 2019-12-24 ENCOUNTER — Other Ambulatory Visit: Payer: Self-pay

## 2019-12-24 ENCOUNTER — Encounter: Payer: Self-pay | Admitting: Family Medicine

## 2019-12-24 VITALS — BP 165/92 | HR 92 | Temp 98.4°F | Ht 65.0 in | Wt 172.4 lb

## 2019-12-24 DIAGNOSIS — N952 Postmenopausal atrophic vaginitis: Secondary | ICD-10-CM | POA: Diagnosis not present

## 2019-12-24 DIAGNOSIS — N898 Other specified noninflammatory disorders of vagina: Secondary | ICD-10-CM | POA: Diagnosis not present

## 2019-12-24 DIAGNOSIS — N819 Female genital prolapse, unspecified: Secondary | ICD-10-CM | POA: Diagnosis not present

## 2019-12-24 LAB — WET PREP FOR TRICH, YEAST, CLUE
Clue Cell Exam: NEGATIVE
Trichomonas Exam: NEGATIVE
Yeast Exam: NEGATIVE

## 2019-12-24 MED ORDER — ESTROGENS, CONJUGATED 0.625 MG/GM VA CREA: 1.0000 | TOPICAL_CREAM | VAGINAL | 6 refills | Status: DC

## 2019-12-24 MED ORDER — PREMARIN 0.625 MG/GM VA CREA: 1.0000 | TOPICAL_CREAM | VAGINAL | 6 refills | Status: DC

## 2019-12-24 NOTE — Patient Instructions (Addendum)
Pelvic Organ Prolapse Pelvic organ prolapse is the stretching, bulging, or dropping of pelvic organs into an abnormal position. It happens when the muscles and tissues that surround and support pelvic structures become weak or stretched. Pelvic organ prolapse can involve the:  Vagina (vaginal prolapse).  Uterus (uterine prolapse).  Bladder (cystocele).  Rectum (rectocele).  Intestines (enterocele). When organs other than the vagina are involved, they often bulge into the vagina or protrude from the vagina, depending on how severe the prolapse is. What are the causes? This condition may be caused by:  Pregnancy, labor, and childbirth.  Past pelvic surgery.  Decreased production of the hormone estrogen associated with menopause.  Consistently lifting more than 50 lb (23 kg).  Obesity.  Long-term inability to pass stool (chronic constipation).  A cough that lasts a long time (chronic).  Buildup of fluid in the abdomen due to certain diseases and other conditions. What are the signs or symptoms? Symptoms of this condition include:  Passing a little urine (loss of bladder control) when you cough, sneeze, strain, and exercise (stress incontinence). This may be worse immediately after childbirth. It may gradually improve over time.  Feeling pressure in your pelvis or vagina. This pressure may increase when you cough or when you are passing stool.  A bulge that protrudes from the opening of your vagina.  Difficulty passing urine or stool.  Pain in your lower back.  Pain, discomfort, or disinterest in sex.  Repeated bladder infections (urinary tract infections).  Difficulty inserting a tampon. In some people, this condition causes no symptoms. How is this diagnosed? This condition may be diagnosed based on a vaginal and rectal exam. During the exam, you may be asked to cough and strain while you are lying down, sitting, and standing up. Your health care provider will  determine if other tests are required, such as bladder function tests. How is this treated? Treatment for this condition may depend on your symptoms. Treatment may include:  Lifestyle changes, such as changes to your diet.  Emptying your bladder at scheduled times (bladder training therapy). This can help reduce or avoid urinary incontinence.  Estrogen. Estrogen may help mild prolapse by increasing the strength and tone of pelvic floor muscles.  Kegel exercises. These may help mild cases of prolapse by strengthening and tightening the muscles of the pelvic floor.  A soft, flexible device that helps support the vaginal walls and keep pelvic organs in place (pessary). This is inserted into your vagina by your health care provider.  Surgery. This is often the only form of treatment for severe prolapse. Follow these instructions at home:  Avoid drinking beverages that contain caffeine or alcohol.  Increase your intake of high-fiber foods. This can help decrease constipation and straining during bowel movements.  Lose weight if recommended by your health care provider.  Wear a sanitary pad or adult diapers if you have urinary incontinence.  Avoid heavy lifting and straining with exercise and work. Do not hold your breath when you perform mild to moderate lifting and exercise activities. Limit your activities as directed by your health care provider.  Do Kegel exercises as directed by your health care provider. To do this: ? Squeeze your pelvic floor muscles tight. You should feel a tight lift in your rectal area and a tightness in your vaginal area. Keep your stomach, buttocks, and legs relaxed. ? Hold the muscles tight for up to 10 seconds. ? Relax your muscles. ? Repeat this exercise 50 times a day,   or as many times as told by your health care provider. Continue to do this exercise for at least 4-6 weeks, or for as long as told by your health care provider.  Take over-the-counter and  prescription medicines only as told by your health care provider.  If you have a pessary, take care of it as told by your health care provider.  Keep all follow-up visits as told by your health care provider. This is important. Contact a health care provider if you:  Have symptoms that interfere with your daily activities or sex life.  Need medicine to help with the discomfort.  Notice bleeding from your vagina that is not related to your period.  Have a fever.  Have pain or bleeding when you urinate.  Have bleeding when you pass stool.  Pass urine when you have sex.  Have chronic constipation.  Have a pessary that falls out.  Have bad smelling vaginal discharge.  Have an unusual, low pain in your abdomen. Summary  Pelvic organ prolapse is the stretching, bulging, or dropping of pelvic organs into an abnormal position. It happens when the muscles and tissues that surround and support pelvic structures become weak or stretched.  When organs other than the vagina are involved, they often bulge into the vagina or protrude from the vagina, depending on how severe the prolapse is.  In most cases, this condition needs to be treated only if it produces symptoms. Treatment may include lifestyle changes, estrogen, Kegel exercises, pessary insertion, or surgery.  Avoid heavy lifting and straining with exercise and work. Do not hold your breath when you perform mild to moderate lifting and exercise activities. Limit your activities as directed by your health care provider. This information is not intended to replace advice given to you by your health care provider. Make sure you discuss any questions you have with your health care provider. Document Revised: 02/16/2017 Document Reviewed: 02/16/2017 [employer] Patient Education  2020 [employer]  

## 2019-12-24 NOTE — Progress Notes (Signed)
BP (!) 165/92   Pulse 92   Temp 98.4 F (36.9 C) (Oral)   Ht '5\' 5"'  (1.651 m)   Wt 172 lb 6.4 oz (78.2 kg)   LMP 09/28/2008 (Approximate)   SpO2 100%   BMI 28.69 kg/m    Subjective:    Patient ID: Diane Pearson, female    DOB: April 15, 1955, 64 y.o.   MRN: [id number]  HPI: Diane Pearson is a 64 y.o. female  Chief Complaint  Patient presents with  . Vaginal Itching    comes and goes for 2 to 3 months  . vaginal swelling    feels like something down there is swollen and makes it hard to urinate for past 2 to 3 weeks ago and comes and goes   VAGINAL ITCHING Duration: 3 months off and on Discharge description: none  Pruritus: yes Dysuria: no Malodorous: no Urinary frequency: no Fevers: no Abdominal pain: no  History of sexually transmitted diseases: no Recent antibiotic use: no Context:: off on on Treatments attempted: none  Relevant past medical, surgical, family and social history reviewed and updated as indicated. Interim medical history since our last visit reviewed. Allergies and medications reviewed and updated.  Review of Systems  Constitutional: Negative.   Respiratory: Negative.   Cardiovascular: Negative.   Gastrointestinal: Negative.   Genitourinary: Positive for difficulty urinating. Negative for decreased urine volume, dyspareunia, dysuria, enuresis, flank pain, frequency, genital sores, hematuria, menstrual problem, pelvic pain, urgency, vaginal bleeding, vaginal discharge and vaginal pain.  Musculoskeletal: Negative.   Skin: Negative.   Psychiatric/Behavioral: Negative.     Per HPI unless specifically indicated above     Objective:    BP (!) 165/92   Pulse 92   Temp 98.4 F (36.9 C) (Oral)   Ht '5\' 5"'  (1.651 m)   Wt 172 lb 6.4 oz (78.2 kg)   LMP 09/28/2008 (Approximate)   SpO2 100%   BMI 28.69 kg/m   Wt Readings from Last 3 Encounters:  12/24/19 172 lb 6.4 oz (78.2 kg)  10/08/19 170 lb (77.1 kg)  09/24/19 170 lb (77.1 kg)    Physical  Exam Vitals and nursing note reviewed. Exam conducted with a chaperone present.  Constitutional:      General: She is not in acute distress.    Appearance: Normal appearance. She is not ill-appearing, toxic-appearing or diaphoretic.  HENT:     Head: Normocephalic and atraumatic.     Right Ear: External ear normal.     Left Ear: External ear normal.     Nose: Nose normal.     Mouth/Throat:     Mouth: Mucous membranes are moist.     Pharynx: Oropharynx is clear.  Eyes:     General: No scleral icterus.       Right eye: No discharge.        Left eye: No discharge.     Extraocular Movements: Extraocular movements intact.     Conjunctiva/sclera: Conjunctivae normal.     Pupils: Pupils are equal, round, and reactive to light.  Cardiovascular:     Rate and Rhythm: Normal rate and regular rhythm.     Pulses: Normal pulses.     Heart sounds: Normal heart sounds. No murmur heard.  No friction rub. No gallop.   Pulmonary:     Effort: Pulmonary effort is normal. No respiratory distress.     Breath sounds: Normal breath sounds. No stridor. No wheezing, rhonchi or rales.  Chest:     Chest wall:  No tenderness.  Genitourinary:    General: Normal vulva.     Labia:        Right: No rash, tenderness, lesion or injury.        Left: No rash, tenderness, lesion or injury.      Vagina: Prolapsed vaginal walls present.     Comments: Dry and silvery on vaginal walls Musculoskeletal:        General: Normal range of motion.     Cervical back: Normal range of motion and neck supple.  Skin:    General: Skin is warm and dry.     Capillary Refill: Capillary refill takes less than 2 seconds.     Coloration: Skin is not jaundiced or pale.     Findings: No bruising, erythema, lesion or rash.  Neurological:     General: No focal deficit present.     Mental Status: She is alert and oriented to person, place, and time. Mental status is at baseline.  Psychiatric:        Mood and Affect: Mood normal.         Behavior: Behavior normal.        Thought Content: Thought content normal.        Judgment: Judgment normal.     Results for orders placed or performed in visit on 01/10/19  Microscopic Examination   URINE  Result Value Ref Range   WBC, UA 0-5 0 - 5 /hpf   RBC 3-10 (A) 0 - 2 /hpf   Epithelial Cells (non renal) 0-10 0 - 10 /hpf   Bacteria, UA Moderate (A) None seen/Few  Urine Culture, Reflex   URINE  Result Value Ref Range   Urine Culture, Routine Final report    Organism ID, Bacteria Comment   UA/M w/rflx Culture, Routine   Specimen: Urine   URINE  Result Value Ref Range   Specific Gravity, UA 1.025 1.005 - 1.030   pH, UA 6.0 5.0 - 7.5   Color, UA Yellow Yellow   Appearance Ur Clear Clear   Leukocytes,UA Negative Negative   Protein,UA Negative Negative/Trace   Glucose, UA Negative Negative   Ketones, UA Negative Negative   RBC, UA 2+ (A) Negative   Bilirubin, UA Negative Negative   Urobilinogen, Ur 0.2 0.2 - 1.0 mg/dL   Nitrite, UA Negative Negative   Microscopic Examination See below:    Urinalysis Reflex Comment   TSH  Result Value Ref Range   TSH 3.500 0.450 - 4.500 uIU/mL  Lipid Panel w/o Chol/HDL Ratio OUT  Result Value Ref Range   Cholesterol, Total 158 100 - 199 mg/dL   Triglycerides 182 (H) 0 - 149 mg/dL   HDL 38 (L) >39 mg/dL   VLDL Cholesterol Cal 31 5 - 40 mg/dL   LDL Chol Calc (NIH) 89 0 - 99 mg/dL  Comp Met (CMET)  Result Value Ref Range   Glucose 99 65 - 99 mg/dL   BUN 20 8 - 27 mg/dL   Creatinine, Ser 0.88 0.57 - 1.00 mg/dL   GFR calc non Af Amer 71 >59 mL/min/1.73   GFR calc Af Amer 81 >59 mL/min/1.73   BUN/Creatinine Ratio 23 12 - 28   Sodium 141 134 - 144 mmol/L   Potassium 4.1 3.5 - 5.2 mmol/L   Chloride 103 96 - 106 mmol/L   CO2 26 20 - 29 mmol/L   Calcium 9.3 8.7 - 10.3 mg/dL   Total Protein 6.6 6.0 - 8.5 g/dL   Albumin 3.7 (  L) 3.8 - 4.8 g/dL   Globulin, Total 2.9 1.5 - 4.5 g/dL   Albumin/Globulin Ratio 1.3 1.2 - 2.2   Bilirubin  Total <0.2 0.0 - 1.2 mg/dL   Alkaline Phosphatase 88 39 - 117 IU/L   AST 17 0 - 40 IU/L   ALT 14 0 - 32 IU/L  CBC with Differential OUT  Result Value Ref Range   WBC 7.4 3.4 - 10.8 x10E3/uL   RBC 3.60 (L) 3.77 - 5.28 x10E6/uL   Hemoglobin 11.4 11.1 - 15.9 g/dL   Hematocrit 33.9 (L) 34.0 - 46.6 %   MCV 94 79 - 97 fL   MCH 31.7 26.6 - 33.0 pg   MCHC 33.6 31 - 35 g/dL   RDW 12.1 11.7 - 15.4 %   Platelets 248 150 - 450 x10E3/uL   Neutrophils 56 Not Estab. %   Lymphs 31 Not Estab. %   Monocytes 7 Not Estab. %   Eos 5 Not Estab. %   Basos 1 Not Estab. %   Neutrophils Absolute 4.1 1.40 - 7.00 x10E3/uL   Lymphocytes Absolute 2.3 0 - 3 x10E3/uL   Monocytes Absolute 0.5 0 - 0 x10E3/uL   EOS (ABSOLUTE) 0.4 0.0 - 0.4 x10E3/uL   Basophils Absolute 0.1 0 - 0 x10E3/uL   Immature Granulocytes 0 Not Estab. %   Immature Grans (Abs) 0.0 0.0 - 0.1 x10E3/uL      Assessment & Plan:   Problem List Items Addressed This Visit    None    Visit Diagnoses    Female genital prolapse, unspecified type    -  Primary   Will refer to GYN. Await their input.    Relevant Orders   Ambulatory referral to Obstetrics / Gynecology   Vaginal irritation       Due to vaginal atrophy. Will treat with premarin. Stop prempro.    Relevant Orders   Urinalysis, Routine w reflex microscopic   WET PREP FOR TRICH, YEAST, CLUE   Vaginal atrophy       Will treat with premarin. Stop prempro.   Relevant Orders   Ambulatory referral to Obstetrics / Gynecology       Follow up plan: No follow-ups on file.

## 2019-12-24 NOTE — Addendum Note (Signed)
Addended by: Donzetta Kohut A on: 12/24/2019 04:48 PM   Modules accepted: Orders

## 2019-12-25 ENCOUNTER — Encounter: Payer: Self-pay | Admitting: Family Medicine

## 2019-12-25 ENCOUNTER — Telehealth: Payer: Self-pay | Admitting: Obstetrics & Gynecology

## 2019-12-25 NOTE — Telephone Encounter (Signed)
[facility] referring for Female genital prolapse, unspecified type,Vaginal atrophy. Called and left voicemail for patient to call back to be scheduled. MD only

## 2020-01-01 ENCOUNTER — Ambulatory Visit (INDEPENDENT_AMBULATORY_CARE_PROVIDER_SITE_OTHER): Payer: Medicare Other | Admitting: Obstetrics and Gynecology

## 2020-01-01 ENCOUNTER — Other Ambulatory Visit: Payer: Self-pay

## 2020-01-01 ENCOUNTER — Encounter: Payer: Self-pay | Admitting: Obstetrics and Gynecology

## 2020-01-01 VITALS — BP 122/78 | Ht 66.0 in | Wt 170.0 lb

## 2020-01-01 DIAGNOSIS — N8111 Cystocele, midline: Secondary | ICD-10-CM

## 2020-01-01 DIAGNOSIS — N816 Rectocele: Secondary | ICD-10-CM | POA: Diagnosis not present

## 2020-01-01 DIAGNOSIS — R339 Retention of urine, unspecified: Secondary | ICD-10-CM | POA: Diagnosis not present

## 2020-01-01 NOTE — Progress Notes (Signed)
CC:  Difficulty voiding  Consultation requested by MD/practitioner: The patient is seen in referral at the request of Park Liter, DO, for genital prolapse.  Subjective  Personal goals:  Urinate without working too hard  She is  Single postmenopausal female  with the above chief complaint and personal goals.    She had 1 children born vaginally and 0 by c-section.  Largest baby weighed 6 lbs 8 oz.   She went through spontaneous menopause at age 64.  Her current symptoms began 6 months to 1 year ago.  These issues became much worse a month or so ago.  By pelvic floor systems, these include the following:  Urinary symptoms include:    She does endorse voiding difficulties.   If yes to above these symptoms include the following:   She does endorses voiding difficulties including:    positive Straining  negative Sense of incomplete emptying She does not report a history of UTIs  She does not have a history hematuria:  Pelvic organ prolapse symptoms include: She does endorse symptoms of pelvic organ prolapse.  If yes to the above these symptoms include the following:  She notices a vaginal bulge outside the vaginal opening She describes this as the size as a walnut   She  does not reports vaginal pressure She does not reduce the bulge in order to urinate  Or defecate Prior treatments include:    Pessary NO  Surgery NO  Colorectal symptoms include: She does not endorse anal incontinence, constipation, diarrhea, obstructed defecation, hematochezia or dyschezia.    Sexual function: Patient is not sexually active.      Patient does desire preservation of coital function.    Health Care Maintenance: Last colorectal cancer screening: This past summer. Normal apart from diverticulosis.  Last mammogram: 07/2019 - BiRads 1 Last pap smear: 12/2017 - NILM, HPV negative   Past Medical History:  Diagnosis Date  . Arthritis   . CAD (coronary artery disease)    Diagnosed  on CT scan  . Depression   . Depression   . Fibromyalgia   . Hypothyroidism   . Insomnia   . Menopause   . Osteoporosis   . Tremors of nervous system    Past Surgical History:  Procedure Laterality Date  . BREAST BIOPSY Left 04/20/2016   COLUMNAR CELL CHANGE  . COLONOSCOPY WITH PROPOFOL N/A 09/29/2018   Procedure: COLONOSCOPY WITH PROPOFOL;  Surgeon: [name], MD;  Location: [facility] ENDOSCOPY;  Service: Endoscopy;  Laterality: N/A;  . varicose veins  1998   Prior to Admission medications   Medication Sig Start Date End Date Taking? Authorizing Provider  estrogen, conjugated,-medroxyprogesterone (PREMPRO) 0.625-2.5 MG tablet Take 1 tablet by mouth daily.   Yes [provider]  Acetaminophen-Codeine 300-30 MG tablet Take 1 tablet by mouth daily as needed. 12/11/19   [provider]  calcium carbonate (OS-CAL - DOSED IN MG OF ELEMENTAL CALCIUM) 1250 (500 Ca) MG tablet Take 1 tablet by mouth.    [provider]  cholecalciferol (VITAMIN D) 1000 UNITS tablet Take 1,000 Units by mouth daily.    [provider]  conjugated estrogens (PREMARIN) vaginal cream Place 1 Applicatorful vaginally every other day. 12/24/19   Johnson, Megan P, DO  cyclobenzaprine (FLEXERIL) 10 MG tablet TAKE ONE TABLET BY MOUTH THREE TIMES A DAY FOR SPASMS 07/25/19   Johnson, Megan P, DO  diclofenac (VOLTAREN) 50 MG EC tablet Take 50 mg by mouth 2 (two) times daily.  Patient not taking: Reported on 12/24/2019 09/28/19   [provider]  diclofenac sodium (VOLTAREN) 1 % GEL Apply 2 g topically 4 (four) times daily. Patient not taking: Reported on 12/24/2019    [provider]  DULoxetine (CYMBALTA) 60 MG capsule Take 1 capsule (60 mg total) by mouth daily. 07/25/19   Johnson, Megan P, DO  famotidine (PEPCID) 10 MG tablet Take 10 mg by mouth at bedtime.    [provider]  hydrOXYzine (ATARAX/VISTARIL) 25 MG tablet Take 1 tablet (25 mg total) by mouth  daily. 07/25/19   Johnson, Megan P, DO  levothyroxine (SYNTHROID) 50 MCG tablet TAKE ONE TABLET BY MOUTH EVERY MORNING 30 MINUTES BEFORE BREAKFAST 03/11/19   Johnson, Megan P, DO  lidocaine (XYLOCAINE) 5 % ointment Apply 1 application topically 3 (three) times daily as needed.  06/19/15   [provider]  Magnesium 400 MG TABS Take 400 mg by mouth daily. 12/06/17   [provider]  meloxicam (MOBIC) 7.5 MG tablet Take one/day; max take 2/day if necessary 12/11/19   [provider]  metoprolol succinate (TOPROL-XL) 50 MG 24 hr tablet TAKE ONE TABLET BY MOUTH DAILY WITH OR IMMEDIATELY FOLLOWING A MEAL 07/25/19   Johnson, Megan P, DO  Multiple Vitamins-Minerals (CENTRUM SILVER ADULT 50+ PO) Take 1 tablet by mouth daily.    [provider]  Potassium 99 MG TABS Take 99 mg by mouth daily.    [provider]  pregabalin (LYRICA) 100 MG capsule Take 1 capsule (100 mg total) by mouth 2 (two) times daily. 11/05/19   Johnson, Megan P, DO  vitamin B-12 (CYANOCOBALAMIN) 500 MCG tablet Take 500 mcg by mouth daily.    [provider]  VITAMIN E PO Take by mouth daily.    [provider]  zolpidem (AMBIEN) 10 MG tablet Take 1 tablet (10 mg total) by mouth at bedtime. 07/25/19   Park Liter P, DO       Allergies  Allergen Reactions  . Bactrim [Sulfamethoxazole-Trimethoprim] Nausea And Vomiting  . Erythromycin Other (See Comments)    cramps  . Gabapentin Other (See Comments)    tremors  . Tamiflu [Oseltamivir Phosphate] Other (See Comments)    Cramps and stomach issues   . Tramadol     headaches  . Nabumetone Rash    itching  . Nitrofurantoin Diarrhea and Nausea And Vomiting    Family History  Problem Relation Age of Onset  . Cancer Father        Lung  . Tuberculosis Father   . Breast cancer Maternal Grandmother 7  . Ulcerative colitis Sister   . Anxiety disorder Daughter   . Alzheimer's disease Maternal Grandfather   . Heart disease  Paternal Grandmother        MI  . Heart disease Paternal Grandfather        MI  . Breast cancer Maternal Aunt        70's   Social History   Tobacco Use  . Smoking status: Current Every Day Smoker    Packs/day: 1.00    Years: 45.00    Pack years: 45.00    Types: Cigarettes  . Smokeless tobacco: Never Used  Substance Use Topics  . Alcohol use: No    Alcohol/week: 0.0 standard drinks   Physical Exam BP 122/78   Ht 5\' 6"  (1.676 m)   Wt 170 lb (77.1 kg)   LMP 09/28/2008 (Approximate)   BMI 27.44 kg/m  Physical Exam Constitutional:  General: She is not in acute distress.    Appearance: Normal appearance. She is well-developed.  HENT:     Head: Normocephalic and atraumatic.  Eyes:     General: No scleral icterus.    Conjunctiva/sclera: Conjunctivae normal.  Cardiovascular:     Rate and Rhythm: Normal rate and regular rhythm.     Heart sounds: No murmur heard.  No friction rub. No gallop.   Pulmonary:     Effort: Pulmonary effort is normal. No respiratory distress.     Breath sounds: Normal breath sounds. No wheezing or rales.  Abdominal:     General: Bowel sounds are normal. There is no distension.     Palpations: Abdomen is soft. There is no mass.     Tenderness: There is no abdominal tenderness. There is no guarding or rebound.  Musculoskeletal:        General: Normal range of motion.     Cervical back: Normal range of motion and neck supple.  Neurological:     General: No focal deficit present.     Mental Status: She is alert and oriented to person, place, and time.     Cranial Nerves: No cranial nerve deficit.  Skin:    General: Skin is warm and dry.     Findings: No erythema.  Psychiatric:        Mood and Affect: Mood normal.        Behavior: Behavior normal.        Judgment: Judgment normal.    Pelvic exam:  She had preserved perineal sensation and reflexes.     positive supine stress test  small   amount of urine  EG/BUS/vaginal:  Atrophic, no  lesions or discharge Cervix:   Normal, closed Uterus:  normal shape, position and consistency Adnexae:  No masses or fullness appreciated Anorectum: external hemorrhoids Anal Sphincter tone: 2/4  Pelvic floor muscle strength (Kegel) =     1 out of 4  POP-Q exam:      Aa = -1 Ba = -1 C = -5  gH = 5 pb = 4 TVL = 9  Ap = 0 BP = 0 D = x   Summary statement of POP-Q exam:    the anterior vaginal wall is just at or just inside the level of the hymen the cuff / cervix is 5 cm inside the level of the hymen,  and the posterior vaginal wall is at the level of the hymen  No void or UA performed.   Female chaperone present for pelvic exam:     ICD-10-CM   1. Urinary retention  R33.9 Ambulatory referral to Urogynecology  2. Prolapse of vaginal wall with midline cystocele  N81.11 Ambulatory referral to Urogynecology  3. Rectocele  N81.6 Ambulatory referral to Urogynecology   Discussed issues of what appears to be at least some degree of either urinary retention or hesitancy.  She also has a grade 2 rectocele and grade 2 cystocele.  It is unclear whether her urinary difficulties are due to her cystocele. Will refer to urogynecology for further evaluation.  Discussed my findings and she agrees to the referral to Urogyn.  A total of 45 minutes were spent face-to-face with the patient as well as preparation, review, communication, and documentation during this encounter.    Prentice Docker, MD, [facility], [facility] 01/01/2020 6:42 PM    CC: Valerie Roys, DO [place],  [place] [phone]

## 2020-01-05 ENCOUNTER — Encounter: Payer: Self-pay | Admitting: Family Medicine

## 2020-01-06 ENCOUNTER — Encounter: Payer: Self-pay | Admitting: Obstetrics and Gynecology

## 2020-01-07 MED ORDER — CYCLOBENZAPRINE HCL 10 MG PO TABS
ORAL_TABLET | ORAL | 0 refills | Status: DC
Start: 2020-01-07 — End: 2020-07-11

## 2020-01-07 MED ORDER — CYCLOBENZAPRINE HCL 10 MG PO TABS
ORAL_TABLET | ORAL | 0 refills | Status: DC
Start: 2020-01-07 — End: 2020-01-07

## 2020-01-09 ENCOUNTER — Encounter: Payer: Self-pay | Admitting: Obstetrics and Gynecology

## 2020-01-11 ENCOUNTER — Ambulatory Visit (INDEPENDENT_AMBULATORY_CARE_PROVIDER_SITE_OTHER): Payer: Medicare Other | Admitting: Family Medicine

## 2020-01-11 ENCOUNTER — Other Ambulatory Visit: Payer: Self-pay

## 2020-01-11 ENCOUNTER — Encounter: Payer: Self-pay | Admitting: Family Medicine

## 2020-01-11 ENCOUNTER — Telehealth: Payer: Self-pay

## 2020-01-11 VITALS — BP 151/85 | HR 83 | Temp 98.3°F | Ht 66.77 in | Wt 168.4 lb

## 2020-01-11 DIAGNOSIS — Z23 Encounter for immunization: Secondary | ICD-10-CM | POA: Diagnosis not present

## 2020-01-11 DIAGNOSIS — S41101A Unspecified open wound of right upper arm, initial encounter: Secondary | ICD-10-CM

## 2020-01-11 DIAGNOSIS — M797 Fibromyalgia: Secondary | ICD-10-CM

## 2020-01-11 DIAGNOSIS — R39198 Other difficulties with micturition: Secondary | ICD-10-CM

## 2020-01-11 DIAGNOSIS — E782 Mixed hyperlipidemia: Secondary | ICD-10-CM | POA: Diagnosis not present

## 2020-01-11 DIAGNOSIS — Z0000 Encounter for general adult medical examination without abnormal findings: Secondary | ICD-10-CM | POA: Diagnosis not present

## 2020-01-11 DIAGNOSIS — F332 Major depressive disorder, recurrent severe without psychotic features: Secondary | ICD-10-CM

## 2020-01-11 DIAGNOSIS — E039 Hypothyroidism, unspecified: Secondary | ICD-10-CM

## 2020-01-11 DIAGNOSIS — F5101 Primary insomnia: Secondary | ICD-10-CM

## 2020-01-11 LAB — URINALYSIS, ROUTINE W REFLEX MICROSCOPIC
Bilirubin, UA: NEGATIVE
Glucose, UA: NEGATIVE
Ketones, UA: NEGATIVE
Leukocytes,UA: NEGATIVE
Nitrite, UA: NEGATIVE
Specific Gravity, UA: 1.025 (ref 1.005–1.030)
Urobilinogen, Ur: 0.2 mg/dL (ref 0.2–1.0)
pH, UA: 6.5 (ref 5.0–7.5)

## 2020-01-11 LAB — MICROSCOPIC EXAMINATION

## 2020-01-11 MED ORDER — METOPROLOL SUCCINATE ER 50 MG PO TB24
ORAL_TABLET | ORAL | 1 refills | Status: DC
Start: 2020-01-11 — End: 2020-07-11

## 2020-01-11 MED ORDER — PREGABALIN 50 MG PO CAPS
ORAL_CAPSULE | ORAL | 1 refills | Status: DC
Start: 2020-01-11 — End: 2020-01-15

## 2020-01-11 MED ORDER — ZOLPIDEM TARTRATE 10 MG PO TABS
10.0000 mg | ORAL_TABLET | Freq: Every day | ORAL | 5 refills | Status: DC
Start: 2020-01-11 — End: 2020-07-11

## 2020-01-11 MED ORDER — MELOXICAM 7.5 MG PO TABS
ORAL_TABLET | ORAL | 1 refills | Status: DC
Start: 2020-01-11 — End: 2020-07-11

## 2020-01-11 MED ORDER — DULOXETINE HCL 60 MG PO CPEP
60.0000 mg | ORAL_CAPSULE | Freq: Every day | ORAL | 1 refills | Status: DC
Start: 2020-01-11 — End: 2020-07-11

## 2020-01-11 NOTE — Patient Instructions (Signed)
Health Maintenance, Female Adopting a healthy lifestyle and getting preventive care are important in promoting health and wellness. Ask your health care provider about:  The right schedule for you to have regular tests and exams.  Things you can do on your own to prevent diseases and keep yourself healthy. What should I know about diet, weight, and exercise? Eat a healthy diet   Eat a diet that includes plenty of vegetables, fruits, low-fat dairy products, and lean protein.  Do not eat a lot of foods that are high in solid fats, added sugars, or sodium. Maintain a healthy weight Body mass index (BMI) is used to identify weight problems. It estimates body fat based on height and weight. Your health care provider can help determine your BMI and help you achieve or maintain a healthy weight. Get regular exercise Get regular exercise. This is one of the most important things you can do for your health. Most adults should:  Exercise for at least 150 minutes each week. The exercise should increase your heart rate and make you sweat (moderate-intensity exercise).  Do strengthening exercises at least twice a week. This is in addition to the moderate-intensity exercise.  Spend less time sitting. Even light physical activity can be beneficial. Watch cholesterol and blood lipids Have your blood tested for lipids and cholesterol at 63 years of age, then have this test every 5 years. Have your cholesterol levels checked more often if:  Your lipid or cholesterol levels are high.  You are older than 63 years of age.  You are at high risk for heart disease. What should I know about cancer screening? Depending on your health history and family history, you may need to have cancer screening at various ages. This may include screening for:  Breast cancer.  Cervical cancer.  Colorectal cancer.  Skin cancer.  Lung cancer. What should I know about heart disease, diabetes, and high blood  pressure? Blood pressure and heart disease  High blood pressure causes heart disease and increases the risk of stroke. This is more likely to develop in people who have high blood pressure readings, are of African descent, or are overweight.  Have your blood pressure checked: ? Every 3-5 years if you are 18-39 years of age. ? Every year if you are 40 years old or older. Diabetes Have regular diabetes screenings. This checks your fasting blood sugar level. Have the screening done:  Once every three years after age 40 if you are at a normal weight and have a low risk for diabetes.  More often and at a younger age if you are overweight or have a high risk for diabetes. What should I know about preventing infection? Hepatitis B If you have a higher risk for hepatitis B, you should be screened for this virus. Talk with your health care provider to find out if you are at risk for hepatitis B infection. Hepatitis C Testing is recommended for:  Everyone born from 1945 through 1965.  Anyone with known risk factors for hepatitis C. Sexually transmitted infections (STIs)  Get screened for STIs, including gonorrhea and chlamydia, if: ? You are sexually active and are younger than 63 years of age. ? You are older than 63 years of age and your health care provider tells you that you are at risk for this type of infection. ? Your sexual activity has changed since you were last screened, and you are at increased risk for chlamydia or gonorrhea. Ask your health care provider if   you are at risk.  Ask your health care provider about whether you are at high risk for HIV. Your health care provider may recommend a prescription medicine to help prevent HIV infection. If you choose to take medicine to prevent HIV, you should first get tested for HIV. You should then be tested every 3 months for as long as you are taking the medicine. Pregnancy  If you are about to stop having your period (premenopausal) and  you may become pregnant, seek counseling before you get pregnant.  Take 400 to 800 micrograms (mcg) of folic acid every day if you become pregnant.  Ask for birth control (contraception) if you want to prevent pregnancy. Osteoporosis and menopause Osteoporosis is a disease in which the bones lose minerals and strength with aging. This can result in bone fractures. If you are 65 years old or older, or if you are at risk for osteoporosis and fractures, ask your health care provider if you should:  Be screened for bone loss.  Take a calcium or vitamin D supplement to lower your risk of fractures.  Be given hormone replacement therapy (HRT) to treat symptoms of menopause. Follow these instructions at home: Lifestyle  Do not use any products that contain nicotine or tobacco, such as cigarettes, e-cigarettes, and chewing tobacco. If you need help quitting, ask your health care provider.  Do not use street drugs.  Do not share needles.  Ask your health care provider for help if you need support or information about quitting drugs. Alcohol use  Do not drink alcohol if: ? Your health care provider tells you not to drink. ? You are pregnant, may be pregnant, or are planning to become pregnant.  If you drink alcohol: ? Limit how much you use to 0-1 drink a day. ? Limit intake if you are breastfeeding.  Be aware of how much alcohol is in your drink. In the [place] one drink equals one 12 oz bottle of beer (355 mL), one 5 oz glass of wine (148 mL), or one 1 oz glass of hard liquor (44 mL). General instructions  Schedule regular health, dental, and eye exams.  Stay current with your vaccines.  Tell your health care provider if: ? You often feel depressed. ? You have ever been abused or do not feel safe at home. Summary  Adopting a healthy lifestyle and getting preventive care are important in promoting health and wellness.  Follow your health care provider's instructions about healthy  diet, exercising, and getting tested or screened for diseases.  Follow your health care provider's instructions on monitoring your cholesterol and blood pressure. This information is not intended to replace advice given to you by your health care provider. Make sure you discuss any questions you have with your health care provider. Document Revised: 01/18/2018 Document Reviewed: 01/18/2018 Elsevier Patient Education  2020 [employer]  

## 2020-01-11 NOTE — Telephone Encounter (Signed)
Can this be switched to two prescriptions?

## 2020-01-11 NOTE — Telephone Encounter (Signed)
Copied from [place] [phone]. Topic: General - Inquiry >> Jan 11, 2020  3:18 PM Greggory Keen D wrote: Reason for CRM: pt called saying she was in the office today and needs the Lyrica written in two different prescriptions.  She said she needs 250 mg's a day but it cant be written in one prescription.   Only for one month.  [address]  CB#  [phone]

## 2020-01-11 NOTE — Progress Notes (Signed)
BP (!) 151/85   Pulse 83   Temp 98.3 F (36.8 C) (Oral)   Ht 5' 6.77" (1.696 m)   Wt 168 lb 6.4 oz (76.4 kg)   LMP 09/28/2008 (Approximate)   SpO2 98%   BMI 26.56 kg/m    Subjective:    Patient ID: Diane Pearson, female    DOB: Jul 06, 1955, 64 y.o.   MRN: [id number]  HPI: Diane Pearson is a 64 y.o. female presenting on 01/11/2020 for comprehensive medical examination. Current medical complaints include:  HYPOTHYROIDISM Thyroid control status:controlled Satisfied with current treatment? yes Medication side effects: no Medication compliance: excellent compliance Recent dose adjustment:no Fatigue: no Cold intolerance: no Heat intolerance: no Weight gain: no Weight loss: no Constipation: no Diarrhea/loose stools: yes Palpitations: no Lower extremity edema: no Anxiety/depressed mood: no  HYPERLIPIDEMIA Hyperlipidemia status: Stable Satisfied with current treatment?  yes Side effects:  no Medication compliance: not on anything Past cholesterol meds: none Supplements: none Aspirin:  no The 10-year ASCVD risk score Mikey Bussing DC Jr., et al., 2013) is: 12.5%   Values used to calculate the score:     Age: 37 years     Sex: Female     Is Non-Hispanic African American: No     Diabetic: No     Tobacco smoker: Yes     Systolic Blood Pressure: 440 mmHg     Is BP treated: No     HDL Cholesterol: 42 mg/dL     Total Cholesterol: 177 mg/dL Chest pain:  no Coronary artery disease:  no  DEPRESSION Mood status: controlled Satisfied with current treatment?: yes Symptom severity: mild  Duration of current treatment : chronic Side effects: no Medication compliance: excellent compliance Psychotherapy/counseling: no  Previous psychiatric medications: cymbalta Depressed mood: no Anxious mood: no Anhedonia: no Significant weight loss or gain: no Insomnia: yes hard to fall asleep Fatigue: yes Feelings of worthlessness or guilt: no Impaired concentration/indecisiveness:  no Suicidal ideations: no Hopelessness: no Crying spells: no Depression screen [facility] 2/9 10/08/2019 05/30/2019 01/09/2019 10/23/2018 10/03/2018  Decreased Interest 0 1 1 2 2   Down, Depressed, Hopeless 2 1 0 2 0  PHQ - 2 Score 2 2 1 4 2   Altered sleeping 0 1 - 2 3  Tired, decreased energy 3 1 - 2 3  Change in appetite 0 0 - 0 0  Feeling bad or failure about yourself  0 0 - 0 0  Trouble concentrating 3 1 - 3 3  Moving slowly or fidgety/restless 0 0 - 0 0  Suicidal thoughts 0 0 - 0 0  PHQ-9 Score 8 5 - 11 11  Difficult doing work/chores Somewhat difficult Somewhat difficult - Very difficult Somewhat difficult  Some recent data might be hidden   FIBROMYALGIA Pain status: stable Satisfied with current treatment?: yes Medication side effects: no Medication compliance: excellent compliance Duration: chronic Location: widespread Quality: aching and sore Current pain level: mild Previous pain level: moderate Aggravating factors: moving Alleviating factors: rest Non-narcotic analgesic meds: yes Narcotic contract:no Treatments attempted: rest, ice, heat, APAP, ibuprofen, aleve, physical therapy and HEP   INSOMNIA Duration: chronic Satisfied with sleep quality: no Difficulty falling asleep: no Difficulty staying asleep: yes Waking a few hours after sleep onset: yes Early morning awakenings: no Daytime hypersomnolence: no Wakes feeling refreshed: no Good sleep hygiene: no Apnea: no Snoring: no Depressed/anxious mood: no Recent stress: no Restless legs/nocturnal leg cramps: no Chronic pain/arthritis: yes History of sleep study: yes Treatments attempted: melatonin, uinsom, benadryl and ambien  Menopausal Symptoms: no  Depression Screen done today and results listed below:  Depression screen [facility] 2/9 10/08/2019 05/30/2019 01/09/2019 10/23/2018 10/03/2018  Decreased Interest 0 1 1 2 2   Down, Depressed, Hopeless 2 1 0 2 0  PHQ - 2 Score 2 2 1 4 2   Altered sleeping 0 1 - 2 3  Tired,  decreased energy 3 1 - 2 3  Change in appetite 0 0 - 0 0  Feeling bad or failure about yourself  0 0 - 0 0  Trouble concentrating 3 1 - 3 3  Moving slowly or fidgety/restless 0 0 - 0 0  Suicidal thoughts 0 0 - 0 0  PHQ-9 Score 8 5 - 11 11  Difficult doing work/chores Somewhat difficult Somewhat difficult - Very difficult Somewhat difficult  Some recent data might be hidden    Past Medical History:  Past Medical History:  Diagnosis Date  . Arthritis   . CAD (coronary artery disease)    Diagnosed on CT scan  . Depression   . Depression   . Family history of breast cancer   . Fibromyalgia   . Hypothyroidism   . Insomnia   . Menopause   . Osteoporosis   . Tremors of nervous system     Surgical History:  Past Surgical History:  Procedure Laterality Date  . BREAST BIOPSY Left 04/20/2016   COLUMNAR CELL CHANGE  . COLONOSCOPY WITH PROPOFOL N/A 09/29/2018   Procedure: COLONOSCOPY WITH PROPOFOL;  Surgeon: [name], MD;  Location: [facility] ENDOSCOPY;  Service: Endoscopy;  Laterality: N/A;  . varicose veins  1998    Medications:  Current Outpatient Medications on File Prior to Visit  Medication Sig  . Acetaminophen-Codeine 300-30 MG tablet Take 1 tablet by mouth daily as needed.  . calcium carbonate (OS-CAL - DOSED IN MG OF ELEMENTAL CALCIUM) 1250 (500 Ca) MG tablet Take 1 tablet by mouth.  . cholecalciferol (VITAMIN D) 1000 UNITS tablet Take 1,000 Units by mouth daily.  Marland Kitchen conjugated estrogens (PREMARIN) vaginal cream Place 1 Applicatorful vaginally every other day.  . cyclobenzaprine (FLEXERIL) 10 MG tablet TAKE ONE TABLET BY MOUTH THREE TIMES A DAY FOR SPASMS  . diclofenac (VOLTAREN) 50 MG EC tablet Take 50 mg by mouth 2 (two) times daily. (Patient not taking: Reported on 12/24/2019)  . diclofenac sodium (VOLTAREN) 1 % GEL Apply 2 g topically 4 (four) times daily. (Patient not taking: Reported on 12/24/2019)  . estrogen, conjugated,-medroxyprogesterone (PREMPRO) 0.625-2.5  MG tablet Take 1 tablet by mouth daily.  . famotidine (PEPCID) 10 MG tablet Take 10 mg by mouth at bedtime.  . hydrOXYzine (ATARAX/VISTARIL) 25 MG tablet Take 1 tablet (25 mg total) by mouth daily.  Marland Kitchen lidocaine (XYLOCAINE) 5 % ointment Apply 1 application topically 3 (three) times daily as needed.   . Magnesium 400 MG TABS Take 400 mg by mouth daily.  . Multiple Vitamins-Minerals (CENTRUM SILVER ADULT 50+ PO) Take 1 tablet by mouth daily.  . Potassium 99 MG TABS Take 99 mg by mouth daily.  . vitamin B-12 (CYANOCOBALAMIN) 500 MCG tablet Take 500 mcg by mouth daily.  Marland Kitchen VITAMIN E PO Take by mouth daily.   No current facility-administered medications on file prior to visit.    Allergies:  Allergies  Allergen Reactions  . Bactrim [Sulfamethoxazole-Trimethoprim] Nausea And Vomiting  . Erythromycin Other (See Comments)    cramps  . Gabapentin Other (See Comments)    tremors  . Tamiflu [Oseltamivir Phosphate] Other (See Comments)  Cramps and stomach issues   . Tramadol     headaches  . Nabumetone Rash    itching  . Nitrofurantoin Diarrhea and Nausea And Vomiting    Social History:  Social History   Socioeconomic History  . Marital status: Divorced    Spouse name: Not on file  . Number of children: 1  . Years of education: Not on file  . Highest education level: 12th grade  Occupational History  . Occupation: disability   Tobacco Use  . Smoking status: Current Every Day Smoker    Packs/day: 1.00    Years: 45.00    Pack years: 45.00    Types: Cigarettes  . Smokeless tobacco: Never Used  Vaping Use  . Vaping Use: Never used  Substance and Sexual Activity  . Alcohol use: No    Alcohol/week: 0.0 standard drinks  . Drug use: No  . Sexual activity: Not Currently  Other Topics Concern  . Not on file  Social History Narrative  . Not on file   Social Determinants of Health   Financial Resource Strain: Medium Risk  . Difficulty of Paying Living Expenses: Somewhat hard    Food Insecurity: No Food Insecurity  . Worried About Charity fundraiser in the Last Year: Never true  . Ran Out of Food in the Last Year: Never true  Transportation Needs: No Transportation Needs  . Lack of Transportation (Medical): No  . Lack of Transportation (Non-Medical): No  Physical Activity: Inactive  . Days of Exercise per Week: 0 days  . Minutes of Exercise per Session: 0 min  Stress: No Stress Concern Present  . Feeling of Stress : Not at all  Social Connections:   . Frequency of Communication with Friends and Family: Not on file  . Frequency of Social Gatherings with Friends and Family: Not on file  . Attends Religious Services: Not on file  . Active Member of Clubs or Organizations: Not on file  . Attends Archivist Meetings: Not on file  . Marital Status: Not on file  Intimate Partner Violence:   . Fear of Current or Ex-Partner: Not on file  . Emotionally Abused: Not on file  . Physically Abused: Not on file  . Sexually Abused: Not on file   Social History   Tobacco Use  Smoking Status Current Every Day Smoker  . Packs/day: 1.00  . Years: 45.00  . Pack years: 45.00  . Types: Cigarettes  Smokeless Tobacco Never Used   Social History   Substance and Sexual Activity  Alcohol Use No  . Alcohol/week: 0.0 standard drinks    Family History:  Family History  Problem Relation Age of Onset  . Cancer Father        Lung  . Tuberculosis Father   . Breast cancer Maternal Grandmother 57  . Ulcerative colitis Sister   . Anxiety disorder Daughter   . Alzheimer's disease Maternal Grandfather   . Heart disease Paternal Grandmother        MI  . Heart disease Paternal Grandfather        MI  . Breast cancer Maternal Aunt        70's    Past medical history, surgical history, medications, allergies, family history and social history reviewed with patient today and changes made to appropriate areas of the chart.   Review of Systems  Constitutional:  Positive for diaphoresis. Negative for chills, fever, malaise/fatigue and weight loss.  HENT: Negative.   Eyes: Positive  for blurred vision. Negative for double vision, photophobia, pain, discharge and redness.  Respiratory: Negative.   Cardiovascular: Positive for leg swelling. Negative for chest pain, palpitations, orthopnea, claudication and PND.  Gastrointestinal: Positive for diarrhea. Negative for abdominal pain, blood in stool, constipation, heartburn, melena, nausea and vomiting.  Genitourinary: Negative.   Musculoskeletal: Positive for back pain and myalgias. Negative for falls, joint pain and neck pain.  Skin: Negative.   Endo/Heme/Allergies: Positive for environmental allergies. Negative for polydipsia. Does not bruise/bleed easily.    All other ROS negative except what is listed above and in the HPI.      Objective:    BP (!) 151/85   Pulse 83   Temp 98.3 F (36.8 C) (Oral)   Ht 5' 6.77" (1.696 m)   Wt 168 lb 6.4 oz (76.4 kg)   LMP 09/28/2008 (Approximate)   SpO2 98%   BMI 26.56 kg/m   Wt Readings from Last 3 Encounters:  01/11/20 168 lb 6.4 oz (76.4 kg)  01/01/20 170 lb (77.1 kg)  12/24/19 172 lb 6.4 oz (78.2 kg)    Physical Exam Vitals and nursing note reviewed.  Constitutional:      General: She is not in acute distress.    Appearance: Normal appearance. She is not ill-appearing, toxic-appearing or diaphoretic.  HENT:     Head: Normocephalic and atraumatic.     Right Ear: Tympanic membrane, ear canal and external ear normal. There is no impacted cerumen.     Left Ear: Tympanic membrane, ear canal and external ear normal. There is no impacted cerumen.     Nose: Nose normal. No congestion or rhinorrhea.     Mouth/Throat:     Mouth: Mucous membranes are moist.     Pharynx: Oropharynx is clear. No oropharyngeal exudate or posterior oropharyngeal erythema.  Eyes:     General: No scleral icterus.       Right eye: No discharge.        Left eye: No discharge.      Extraocular Movements: Extraocular movements intact.     Conjunctiva/sclera: Conjunctivae normal.     Pupils: Pupils are equal, round, and reactive to light.  Neck:     Vascular: No carotid bruit.  Cardiovascular:     Rate and Rhythm: Normal rate and regular rhythm.     Pulses: Normal pulses.     Heart sounds: No murmur heard.  No friction rub. No gallop.   Pulmonary:     Effort: Pulmonary effort is normal. No respiratory distress.     Breath sounds: Normal breath sounds. No stridor. No wheezing, rhonchi or rales.  Chest:     Chest wall: No tenderness.  Abdominal:     General: Abdomen is flat. Bowel sounds are normal. There is no distension.     Palpations: Abdomen is soft. There is no mass.     Tenderness: There is no abdominal tenderness. There is no right CVA tenderness, left CVA tenderness, guarding or rebound.     Hernia: No hernia is present.  Genitourinary:    Comments: Breast and pelvic exams deferred with shared decision making Musculoskeletal:        General: No swelling, tenderness, deformity or signs of injury.     Cervical back: Normal range of motion and neck supple. No rigidity. No muscular tenderness.     Right lower leg: No edema.     Left lower leg: No edema.  Lymphadenopathy:     Cervical: No cervical adenopathy.  Skin:  General: Skin is warm and dry.     Capillary Refill: Capillary refill takes less than 2 seconds.     Coloration: Skin is not jaundiced or pale.     Findings: No bruising, erythema, lesion or rash.  Neurological:     General: No focal deficit present.     Mental Status: She is alert and oriented to person, place, and time. Mental status is at baseline.     Cranial Nerves: No cranial nerve deficit.     Sensory: No sensory deficit.     Motor: No weakness.     Coordination: Coordination normal.     Gait: Gait normal.     Deep Tendon Reflexes: Reflexes normal.  Psychiatric:        Mood and Affect: Mood normal.        Behavior: Behavior  normal.        Thought Content: Thought content normal.        Judgment: Judgment normal.     Results for orders placed or performed in visit on 01/11/20  Microscopic Examination   Urine  Result Value Ref Range   WBC, UA 0-5 0 - 5 /hpf   RBC 0-2 0 - 2 /hpf   Epithelial Cells (non renal) 0-10 0 - 10 /hpf   Bacteria, UA Moderate (A) None seen/Few  CBC with Differential/Platelet  Result Value Ref Range   WBC 7.5 3.4 - 10.8 x10E3/uL   RBC 3.73 (L) 3.77 - 5.28 x10E6/uL   Hemoglobin 11.9 11.1 - 15.9 g/dL   Hematocrit 34.4 34.0 - 46.6 %   MCV 92 79 - 97 fL   MCH 31.9 26.6 - 33.0 pg   MCHC 34.6 31 - 35 g/dL   RDW 12.6 11.7 - 15.4 %   Platelets 252 150 - 450 x10E3/uL   Neutrophils 47 Not Estab. %   Lymphs 39 Not Estab. %   Monocytes 7 Not Estab. %   Eos 6 Not Estab. %   Basos 1 Not Estab. %   Neutrophils Absolute 3.5 1.40 - 7.00 x10E3/uL   Lymphocytes Absolute 2.9 0 - 3 x10E3/uL   Monocytes Absolute 0.5 0 - 0 x10E3/uL   EOS (ABSOLUTE) 0.4 0.0 - 0.4 x10E3/uL   Basophils Absolute 0.1 0 - 0 x10E3/uL   Immature Granulocytes 0 Not Estab. %   Immature Grans (Abs) 0.0 0.0 - 0.1 x10E3/uL  Comprehensive metabolic panel  Result Value Ref Range   Glucose 70 65 - 99 mg/dL   BUN 16 8 - 27 mg/dL   Creatinine, Ser 0.84 0.57 - 1.00 mg/dL   GFR calc non Af Amer 74 >59 mL/min/1.73   GFR calc Af Amer 86 >59 mL/min/1.73   BUN/Creatinine Ratio 19 12 - 28   Sodium 140 134 - 144 mmol/L   Potassium 4.0 3.5 - 5.2 mmol/L   Chloride 104 96 - 106 mmol/L   CO2 22 20 - 29 mmol/L   Calcium 9.0 8.7 - 10.3 mg/dL   Total Protein 6.4 6.0 - 8.5 g/dL   Albumin 4.0 3.8 - 4.8 g/dL   Globulin, Total 2.4 1.5 - 4.5 g/dL   Albumin/Globulin Ratio 1.7 1.2 - 2.2   Bilirubin Total 0.3 0.0 - 1.2 mg/dL   Alkaline Phosphatase 101 44 - 121 IU/L   AST 23 0 - 40 IU/L   ALT 14 0 - 32 IU/L  Lipid Panel w/o Chol/HDL Ratio  Result Value Ref Range   Cholesterol, Total 177 100 - 199 mg/dL  Triglycerides 160 (H) 0 - 149  mg/dL   HDL 42 >39 mg/dL   VLDL Cholesterol Cal 28 5 - 40 mg/dL   LDL Chol Calc (NIH) 107 (H) 0 - 99 mg/dL  TSH  Result Value Ref Range   TSH 3.140 0.450 - 4.500 uIU/mL  Urinalysis, Routine w reflex microscopic  Result Value Ref Range   Specific Gravity, UA 1.025 1.005 - 1.030   pH, UA 6.5 5.0 - 7.5   Color, UA Yellow Yellow   Appearance Ur Clear Clear   Leukocytes,UA Negative Negative   Protein,UA Trace (A) Negative/Trace   Glucose, UA Negative Negative   Ketones, UA Negative Negative   RBC, UA 2+ (A) Negative   Bilirubin, UA Negative Negative   Urobilinogen, Ur 0.2 0.2 - 1.0 mg/dL   Nitrite, UA Negative Negative   Microscopic Examination See below:       Assessment & Plan:   Problem List Items Addressed This Visit      Endocrine   Hypothyroid    Rechecking labs today. Await results. Treat as needed.       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 Continue current regimen. Continue to monitor. Call with any concerns. Refills given.        Relevant Medications   DULoxetine (CYMBALTA) 60 MG capsule   Other Relevant Orders   CBC with Differential/Platelet (Completed)   Insomnia    Under good control on current regimen. Continue current regimen. Continue to monitor. Call with any concerns. Refills given for 6 months.      Fibromyalgia    Under good control on current regimen. Continue current regimen. Continue to monitor. Call with any concerns. Refills given.        Relevant Medications   meloxicam (MOBIC) 7.5 MG tablet   DULoxetine (CYMBALTA) 60 MG capsule   pregabalin (LYRICA) 50 MG capsule   pregabalin (LYRICA) 100 MG capsule   Hyperlipidemia    Rechecking labs today. Await results. Treat as needed.       Relevant Medications   metoprolol succinate (TOPROL-XL) 50 MG 24 hr tablet   Other  Relevant Orders   Comprehensive metabolic panel (Completed)   Lipid Panel w/o Chol/HDL Ratio (Completed)    Other Visit Diagnoses    Routine general medical examination at a health care facility    -  Primary   Vaccines up to date/declined. Screening labs checked today. Pap up to date. Mammogram and colonoscopy up to date. Continue diet and exercise. Call with concerns   Difficulty urinating       Following with uro/gyn- has appointment coming up. Checking UA today. Await results. Treat as needed.    Relevant Orders   Urinalysis, Routine w reflex microscopic (Completed)   Open wound of right upper arm, initial encounter       Consistent with bug bite. Due for Td. Given today.       Follow up plan: Return in about 3 months (around 04/10/2020).   LABORATORY TESTING:  - Pap smear: up to date  IMMUNIZATIONS:   - Tdap: Tetanus vaccination status reviewed: Td vaccination indicated and given today. - Influenza: Refused - Pneumovax: Up to date - Prevnar: Not applicable - COVID: Up to date  SCREENING: -Mammogram: Up to date  - Colonoscopy: Up to date  PATIENT COUNSELING:   Advised to take 1 mg of folate supplement per day if capable of pregnancy.   Sexuality: Discussed sexually transmitted diseases, partner selection, use of condoms, avoidance of unintended pregnancy  and contraceptive alternatives.   Advised to avoid cigarette smoking.  I discussed with the patient that most people either abstain from alcohol or drink within safe limits (<=14/week and <=4 drinks/occasion for males, <=7/weeks and <= 3 drinks/occasion for females) and that the risk for alcohol disorders and other health effects rises proportionally with the number of drinks per week and how often a drinker exceeds daily limits.  Discussed cessation/primary prevention of drug use and availability of treatment for abuse.   Diet: Encouraged to adjust caloric intake to maintain  or achieve ideal body weight, to reduce  intake of dietary saturated fat and total fat, to limit sodium intake by avoiding high sodium foods and not adding table salt, and to maintain adequate dietary potassium and calcium preferably from fresh fruits, vegetables, and low-fat dairy products.    stressed the importance of regular exercise  Injury prevention: Discussed safety belts, safety helmets, smoke detector, smoking near bedding or upholstery.   Dental health: Discussed importance of regular tooth brushing, flossing, and dental visits.    NEXT PREVENTATIVE PHYSICAL DUE IN 1 YEAR. Return in about 3 months (around 04/10/2020).

## 2020-01-12 LAB — CBC WITH DIFFERENTIAL/PLATELET
Basophils Absolute: 0.1 10*3/uL (ref 0.0–0.2)
Basos: 1 %
EOS (ABSOLUTE): 0.4 10*3/uL (ref 0.0–0.4)
Eos: 6 %
Hematocrit: 34.4 % (ref 34.0–46.6)
Hemoglobin: 11.9 g/dL (ref 11.1–15.9)
Immature Grans (Abs): 0 10*3/uL (ref 0.0–0.1)
Immature Granulocytes: 0 %
Lymphocytes Absolute: 2.9 10*3/uL (ref 0.7–3.1)
Lymphs: 39 %
MCH: 31.9 pg (ref 26.6–33.0)
MCHC: 34.6 g/dL (ref 31.5–35.7)
MCV: 92 fL (ref 79–97)
Monocytes Absolute: 0.5 10*3/uL (ref 0.1–0.9)
Monocytes: 7 %
Neutrophils Absolute: 3.5 10*3/uL (ref 1.4–7.0)
Neutrophils: 47 %
Platelets: 252 10*3/uL (ref 150–450)
RBC: 3.73 x10E6/uL — ABNORMAL LOW (ref 3.77–5.28)
RDW: 12.6 % (ref 11.7–15.4)
WBC: 7.5 10*3/uL (ref 3.4–10.8)

## 2020-01-12 LAB — COMPREHENSIVE METABOLIC PANEL
ALT: 14 IU/L (ref 0–32)
AST: 23 IU/L (ref 0–40)
Albumin/Globulin Ratio: 1.7 (ref 1.2–2.2)
Albumin: 4 g/dL (ref 3.8–4.8)
Alkaline Phosphatase: 101 IU/L (ref 44–121)
BUN/Creatinine Ratio: 19 (ref 12–28)
BUN: 16 mg/dL (ref 8–27)
Bilirubin Total: 0.3 mg/dL (ref 0.0–1.2)
CO2: 22 mmol/L (ref 20–29)
Calcium: 9 mg/dL (ref 8.7–10.3)
Chloride: 104 mmol/L (ref 96–106)
Creatinine, Ser: 0.84 mg/dL (ref 0.57–1.00)
GFR calc Af Amer: 86 mL/min/{1.73_m2} (ref >59)
GFR calc non Af Amer: 74 mL/min/{1.73_m2} (ref >59)
Globulin, Total: 2.4 g/dL (ref 1.5–4.5)
Glucose: 70 mg/dL (ref 65–99)
Potassium: 4 mmol/L (ref 3.5–5.2)
Sodium: 140 mmol/L (ref 134–144)
Total Protein: 6.4 g/dL (ref 6.0–8.5)

## 2020-01-12 LAB — TSH: TSH: 3.14 u[IU]/mL (ref 0.450–4.500)

## 2020-01-12 LAB — LIPID PANEL W/O CHOL/HDL RATIO
Cholesterol, Total: 177 mg/dL (ref 100–199)
HDL: 42 mg/dL (ref >39)
LDL Chol Calc (NIH): 107 mg/dL — ABNORMAL HIGH (ref 0–99)
Triglycerides: 160 mg/dL — ABNORMAL HIGH (ref 0–149)
VLDL Cholesterol Cal: 28 mg/dL (ref 5–40)

## 2020-01-14 ENCOUNTER — Telehealth: Payer: Self-pay

## 2020-01-14 ENCOUNTER — Encounter: Payer: Self-pay | Admitting: Family Medicine

## 2020-01-14 ENCOUNTER — Other Ambulatory Visit: Payer: Self-pay | Admitting: Family Medicine

## 2020-01-14 MED ORDER — LEVOTHYROXINE SODIUM 50 MCG PO TABS
ORAL_TABLET | ORAL | 3 refills | Status: DC
Start: 2020-01-14 — End: 2020-02-18

## 2020-01-14 NOTE — Telephone Encounter (Signed)
PA submitted via Cover my meds for pregabalin. Will await approval or denial. Key: [id number]

## 2020-01-15 ENCOUNTER — Encounter: Payer: Self-pay | Admitting: Family Medicine

## 2020-01-15 MED ORDER — PREGABALIN 100 MG PO CAPS: 100.0000 mg | ORAL_CAPSULE | Freq: Two times a day (BID) | ORAL | 1 refills | Status: DC

## 2020-01-15 MED ORDER — PREGABALIN 50 MG PO CAPS
50.0000 mg | ORAL_CAPSULE | Freq: Every day | ORAL | 1 refills | Status: DC
Start: 2020-01-15 — End: 2020-07-11

## 2020-01-15 NOTE — Assessment & Plan Note (Signed)
Under good control on current regimen. Continue current regimen. Continue to monitor. Call with any concerns. Refills given.   

## 2020-01-15 NOTE — Assessment & Plan Note (Signed)
Rechecking labs today. Await results. Treat as needed.  °

## 2020-01-15 NOTE — Assessment & Plan Note (Addendum)
Under good control on current regimen. Continue current regimen. Continue to monitor. Call with any concerns. Refills given for 6 months.   

## 2020-01-17 NOTE — Telephone Encounter (Signed)
Can I call the lyrica into the [place] location?

## 2020-01-17 NOTE — Telephone Encounter (Signed)
Medication called in 

## 2020-02-02 ENCOUNTER — Other Ambulatory Visit: Payer: Self-pay | Admitting: Family Medicine

## 2020-02-03 NOTE — Telephone Encounter (Signed)
Requested medication (s) are due for refill today: -  Requested medication (s) are on the active medication list: historical med  Last refill:  01/01/20  Future visit scheduled: yes  Notes to clinic:  historical provider (Dr Aquilla Solian) - Dr Glennon Mac was a consult.    Requested Prescriptions  Pending Prescriptions Disp Refills   PREMPRO 0.625-2.5 MG tablet [Pharmacy Med Name: PREMPRO 0.625MG /2.5MG  TAB28 PEACH] 28 tablet     Sig: TAKE 1 TABLET BY MOUTH DAILY      OB/GYN:  Hormone Combinations Failed - 02/02/2020  3:38 AM      Failed - Last BP in normal range    BP Readings from Last 1 Encounters:  01/11/20 (!) 151/85          Passed - Mammogram is up-to-date per Health Maintenance      Passed - Valid encounter within last 12 months    Recent Outpatient Visits           3 weeks ago Routine general medical examination at a health care facility   [place], [place], DO   1 month ago Female genital prolapse, unspecified type   [place], [place], DO   6 months ago [place], [place], DO   8 months ago [place], [place], DO   1 year ago Hypothyroidism, unspecified type   [facility] Valerie Roys, DO       Future Appointments             In 3 weeks Wannetta Sender Governor Rooks, MD Urogynecology at [facility], [facility]   In 1 month Brendolyn Patty, MD [place]   In 2 months Wynetta Emery, Barb Merino, DO [place], [place]   In 8 months  [employer], [place]

## 2020-02-04 ENCOUNTER — Other Ambulatory Visit: Payer: Self-pay | Admitting: Family Medicine

## 2020-02-04 NOTE — Telephone Encounter (Signed)
Can this be refilled? 

## 2020-02-04 NOTE — Telephone Encounter (Signed)
Requested medication (s) are due for refill today: yes  Requested medication (s) are on the active medication list: yes  Last refill:  01/11/20 #30  Future visit scheduled: yes  Notes to clinic:  Please review for refill. Refill not delegated protocol    Requested Prescriptions  Pending Prescriptions Disp Refills   zolpidem (AMBIEN) 10 MG tablet [Pharmacy Med Name: ZOLPIDEM 10MG  TABLETS] 30 tablet     Sig: TAKE 1 TABLET BY MOUTH EVERY NIGHT AT BEDTIME      Not Delegated - Psychiatry:  Anxiolytics/Hypnotics Failed - 02/04/2020 10:16 AM      Failed - This refill cannot be delegated      Failed - Urine Drug Screen completed in last 360 days      Passed - Valid encounter within last 6 months    Recent Outpatient Visits           3 weeks ago Routine general medical examination at a health care facility   [facility], [place], DO   1 month ago Female genital prolapse, unspecified type   [place], [place], DO   6 months ago [place], [place], DO   8 months ago [place], [place], DO   1 year ago Hypothyroidism, unspecified type   [facility] Valerie Roys, DO       Future Appointments             In 3 weeks Jaquita Folds, MD Urogynecology at [facility], [facility]   In 1 month Brendolyn Patty, MD [place]   In 2 months Wynetta Emery, Barb Merino, DO [place], [place]   In 8 months  [employer], [place]

## 2020-02-05 NOTE — Telephone Encounter (Signed)
I don't see reference to Dr. Jean Rosenthal writing that medicine at her appointment in November, and she was supposed to see uro/gyn. I would prefer it be written by the prescriber if she is supposed to be following up with them.

## 2020-02-05 NOTE — Telephone Encounter (Signed)
Patient notified

## 2020-02-14 ENCOUNTER — Encounter: Payer: Self-pay | Admitting: Family Medicine

## 2020-02-18 ENCOUNTER — Other Ambulatory Visit: Payer: Self-pay | Admitting: Family Medicine

## 2020-02-18 ENCOUNTER — Other Ambulatory Visit: Payer: Self-pay | Admitting: Obstetrics and Gynecology

## 2020-02-18 DIAGNOSIS — N951 Menopausal and female climacteric states: Secondary | ICD-10-CM

## 2020-02-18 MED ORDER — PREMPRO 0.45-1.5 MG PO TABS: 1.0000 | ORAL_TABLET | Freq: Every day | ORAL | 1 refills | Status: DC

## 2020-02-21 NOTE — Progress Notes (Addendum)
[facility] New Patient Evaluation and Consultation  Referring Provider: Will Bonnet, MD PCP: Valerie Roys, DO Date of Service: 02/27/2020  SUBJECTIVE Chief Complaint: New Patient (Initial Visit)  History of Present Illness: Diane Pearson is a 65 y.o. White or Caucasian female seen in consultation at the request of Dr. Glennon Mac for evaluation of prolapse.    Review of records significant for: Noticed a bulge in the vagina, had stage II prolapse on exam. Also reports difficulty with voiding.   Urinary Symptoms: Leaks urine with cough/ sneeze and continuously. Every night is able to go to the bathroom large amount.  Leaks several time(s) per day- very small amounts Pad use: none She is not bothered by her UI symptoms.  Day time voids 4.  Nocturia: 1-2 times per night to void. Voiding dysfunction: she empties her bladder well.  does not use a catheter to empty bladder.  When urinating, she feels difficulty starting urine stream and dribbling after finishing. Feels that bladder is full but unable to go.  Drinks: 4 cans mountain dew, 1 glass sweet tea, very little water per day Drinks water and mountain dew at night.   UTIs: 0 UTI's in the last year.   Denies history of blood in urine and kidney or bladder stones  Pelvic Organ Prolapse Symptoms:                  She Admits to a feeling of a bulge the vaginal area. It has been present for 3-4 months.  She Denies seeing a bulge.  This bulge is sometimes bothersome.  Bowel Symptom: Bowel movements: every few days Stool consistency: soft  Straining: no.  Splinting: no.  Incomplete evacuation: yes, sometimes She Admits to accidental bowel leakage / fecal incontinence  Occurs: 1-2 time(s) per year when she has too many mini wheats  Consistency with leakage: soft  Bowel regimen: sometimes takes stool softener Last colonoscopy: Date 07/2019, Results good  Sexual Function Sexually active: no.   Pelvic  Pain Denies pelvic pain   Past Medical History:  Past Medical History:  Diagnosis Date  . Arthritis   . CAD (coronary artery disease)    Diagnosed on CT scan  . Depression   . Depression   . Family history of breast cancer   . Fibromyalgia   . Hypothyroidism   . Insomnia   . Menopause   . Osteoporosis   . Tremors of nervous system      Past Surgical History:   Past Surgical History:  Procedure Laterality Date  . BREAST BIOPSY Left 04/20/2016   COLUMNAR CELL CHANGE  . COLONOSCOPY WITH PROPOFOL N/A 09/29/2018   Procedure: COLONOSCOPY WITH PROPOFOL;  Surgeon: [name], MD;  Location: [facility] ENDOSCOPY;  Service: Endoscopy;  Laterality: N/A;  . varicose veins  1998     Past OB/GYN History: G2 P1 Vaginal deliveries: 1,  Forceps/ Vacuum deliveries: 0, Cesarean section: 0 Menopausal: Yes, Denies vaginal bleeding since menopause Last pap smear was 2019- negative.  Any history of abnormal pap smears: yes but no cervical procedures   Medications: She has a current medication list which includes the following prescription(s): acetaminophen-codeine, calcium carbonate, cholecalciferol, conjugated estrogens, cyclobenzaprine, diclofenac, diclofenac sodium, duloxetine, prempro, famotidine, hydroxyzine, levothyroxine, lidocaine, magnesium, meloxicam, metoprolol succinate, multiple vitamins-minerals, potassium, pregabalin, pregabalin, vitamin b-12, vitamin e, and zolpidem.   Allergies: Patient is allergic to bactrim [sulfamethoxazole-trimethoprim], erythromycin, gabapentin, tamiflu [oseltamivir phosphate], tramadol, nabumetone, and nitrofurantoin.   Social History:  Social History  Tobacco Use  . Smoking status: Current Every Day Smoker    Packs/day: 1.00    Years: 45.00    Pack years: 45.00    Types: Cigarettes  . Smokeless tobacco: Never Used  Vaping Use  . Vaping Use: Never used  Substance Use Topics  . Alcohol use: No    Alcohol/week: 0.0 standard drinks  . Drug  use: No    Relationship status: long-term partner She lives with partner.   She is not employed- on disability. Regular exercise: Yes: rides the stationary bike  Family History:   Family History  Problem Relation Age of Onset  . Cancer Father        Lung  . Tuberculosis Father   . Breast cancer Maternal Grandmother 21  . Ulcerative colitis Sister   . Anxiety disorder Daughter   . Alzheimer's disease Maternal Grandfather   . Heart disease Paternal Grandmother        MI  . Heart disease Paternal Grandfather        MI  . Breast cancer Maternal Aunt        70's     Review of Systems: Review of Systems  Constitutional: Positive for malaise/fatigue. Negative for fever and weight loss.  Respiratory: Positive for shortness of breath. Negative for cough and wheezing.   Cardiovascular: Positive for leg swelling. Negative for chest pain and palpitations.  Gastrointestinal: Positive for abdominal pain. Negative for blood in stool.  Genitourinary: Negative for dysuria.  Musculoskeletal: Positive for myalgias.  Skin: Positive for rash.  Neurological: Positive for dizziness. Negative for headaches.  Endo/Heme/Allergies: Bruises/bleeds easily.  Psychiatric/Behavioral: Negative for depression. The patient is not nervous/anxious.      OBJECTIVE Physical Exam: Vitals:   02/27/20 1518  BP: (!) 179/102  Pulse: 88  Height: 5\' 6"  (1.676 m)    Physical Exam Constitutional:      General: She is not in acute distress. Pulmonary:     Effort: Pulmonary effort is normal.  Abdominal:     General: There is no distension.     Palpations: Abdomen is soft.     Tenderness: There is no abdominal tenderness. There is no rebound.  Musculoskeletal:        General: No swelling. Normal range of motion.  Skin:    General: Skin is warm and dry.     Findings: No rash.  Neurological:     Mental Status: She is alert and oriented to person, place, and time.  Psychiatric:        Mood and Affect:  Mood normal.        Behavior: Behavior normal.     GU / Detailed Urogynecologic Evaluation:  Pelvic Exam: Normal external female genitalia; Bartholin's and Skene's glands normal in appearance; urethral meatus normal in appearance, no urethral masses or discharge.   CST: negative  Speculum exam reveals normal vaginal mucosa without atrophy. Cervix normal appearance. Uterus normal single, nontender. Adnexa no mass, fullness, tenderness.    Pelvic floor strength I/V, puborectalis II/V external anal sphincter II/V  Pelvic floor musculature: Right levator non-tender, Right obturator non-tender, Left levator non-tender, Left obturator non-tender  POP-Q:   POP-Q  1.5                                            Aa   1.5  Ba  -5.5                                              C   4                                            Gh  3                                            Pb  9.5                                            tvl   -2.5                                            Ap  -2.5                                            Bp  -7                                              D     Rectal Exam:  Normal sphincter tone, no distal rectocele, enterocoele not present, no rectal masses, noted dyssynergia when asking the patient to bear down.  Post-Void Residual (PVR) by Bladder Scan: In order to evaluate bladder emptying, we discussed obtaining a postvoid residual and she agreed to this procedure. She was unable to void as she had voided before the appointment.  Procedure: The ultrasound unit was placed on the patient's abdomen in the suprapubic region after the patient had voided. A PVR of 27 ml was obtained by bladder scan.  Laboratory Results: POC urine: trace blood, trace protein I visualized the urine specimen, noting the specimen to be dark yellow  ASSESSMENT AND PLAN Ms. Sills is a 65 y.o. with:  1. Uterovaginal prolapse,  incomplete   2. Prolapse of anterior vaginal wall   3. Urinary frequency   4. Rectosphincteric dyssynergia     1. Stage III anterior, Stage I posterior, Stage I apical prolapse For treatment of pelvic organ prolapse, we discussed options for management including expectant management, conservative management, and surgical management, such as Kegels, a pessary, pelvic floor physical therapy, and specific surgical procedures. - She would like to consider her options and will let us know what she decides. She was given an IUGA handout about prolapse options. If she decides on surgery then she will need to undergo urodynamic testing.   2. Urinary frequency/ Nocturia We discussed the symptoms of overactive bladder (OAB), which include urinary urgency, urinary frequency, nocturia, with or without urge incontinence.  While we do not know the exact etiology  of OAB, several treatment options exist. We discussed management including behavioral therapy (decreasing bladder irritants, urge suppression strategies, timed voids, bladder retraining), physical therapy, medication. Other therapies are available for refractory cases.  - Discussed decreasing bladder irritants (mt dew, tea) and increasing water intake (ideally 60oz per day). List of bladder irritants provided.  - She should also avoid drinking at least 2 hours prior to bedtime.   3. Pelvic floor dyssynergia - Discussed that this can contribute to incomplete defecation. Offered physical therapy referral and she will consider this.   Patient will follow up with PCP today regarding elevated BP. She denies any symptoms of HA or dizziness.   She will notify us of what she decides. Follow up as needed.    Jaquita Folds, MD   Time spent: I spent 50 minutes dedicated to the care of this patient on the date of this encounter to include pre-visit review of records, face-to-face time with the patient and post visit documentation and ordering  medication/ testing.

## 2020-02-27 ENCOUNTER — Ambulatory Visit (INDEPENDENT_AMBULATORY_CARE_PROVIDER_SITE_OTHER): Payer: Medicare Other | Admitting: Obstetrics and Gynecology

## 2020-02-27 ENCOUNTER — Other Ambulatory Visit: Payer: Self-pay

## 2020-02-27 ENCOUNTER — Encounter: Payer: Self-pay | Admitting: Obstetrics and Gynecology

## 2020-02-27 VITALS — BP 179/102 | HR 88 | Ht 66.0 in

## 2020-02-27 DIAGNOSIS — N8110 Cystocele, unspecified: Secondary | ICD-10-CM

## 2020-02-27 DIAGNOSIS — R198 Other specified symptoms and signs involving the digestive system and abdomen: Secondary | ICD-10-CM

## 2020-02-27 DIAGNOSIS — N812 Incomplete uterovaginal prolapse: Secondary | ICD-10-CM

## 2020-02-27 DIAGNOSIS — R350 Frequency of micturition: Secondary | ICD-10-CM

## 2020-02-27 LAB — POCT URINALYSIS DIPSTICK
Bilirubin, UA: NEGATIVE
Glucose, UA: NEGATIVE
Ketones, UA: NEGATIVE
Leukocytes, UA: NEGATIVE
Nitrite, UA: NEGATIVE
Protein, UA: POSITIVE — AB
Spec Grav, UA: 1.02 (ref 1.010–1.025)
Urobilinogen, UA: 0.2 E.U./dL
pH, UA: 6 (ref 5.0–8.0)

## 2020-02-27 NOTE — Patient Instructions (Addendum)
Today we talked about ways to manage bladder urgency such as altering your diet to avoid irritative beverages and foods (bladder diet) as well as attempting to decrease stress and other exacerbating factors.    The Most Bothersome Foods* The Least Bothersome Foods*  Coffee - Regular & Decaf Tea - caffeinated Carbonated beverages - cola, non-colas, diet & caffeine-free Alcohols - Beer, Red Wine, White Wine, Champagne Fruits - Grapefruit, Lemon, Orange, Pineapple Fruit Juices - Cranberry, Grapefruit, Orange, Pineapple Vegetables - Tomato & Tomato Products Flavor Enhancers - Hot peppers, Spicy foods, Chili, Horseradish, Vinegar, Monosodium glutamate (MSG) Artificial Sweeteners - NutraSweet, Sweet 'N Low, Equal (sweetener), Saccharin Ethnic foods - Mexican, Thai, Indian food Water Milk - low-fat & whole Fruits - Bananas, Blueberries, Honeydew melon, Pears, Raisins, Watermelon Vegetables - Broccoli, Brussels Sprouts, Cabbage, Carrots, Cauliflower, Celery, Cucumber, Mushrooms, Peas, Radishes, Squash, Zucchini, White potatoes, Sweet potatoes & yams Poultry - Chicken, Eggs, Turkey, Meat - Beef, Pork, Lamb Seafood - Shrimp, Tuna fish, Salmon Grains - Oat, Rice Snacks - Pretzels, Popcorn  *Friedlander J. et al. Diet and its role in interstitial cystitis/bladder pain syndrome (IC/BPS) and comorbid conditions. [employer]. [employer] Int. 2012 Jan 11.   You have a stage 3 (out of 4) prolapse.  We discussed the fact that it is not life threatening but there are several treatment options. For treatment of pelvic organ prolapse, we discussed options for management including expectant management, conservative management, and surgical management, such as Kegels, a pessary, pelvic floor physical therapy, and specific surgical procedures.    

## 2020-03-07 DIAGNOSIS — M797 Fibromyalgia: Secondary | ICD-10-CM | POA: Diagnosis not present

## 2020-03-07 DIAGNOSIS — M7918 Myalgia, other site: Secondary | ICD-10-CM | POA: Diagnosis not present

## 2020-03-07 DIAGNOSIS — M25562 Pain in left knee: Secondary | ICD-10-CM | POA: Diagnosis not present

## 2020-03-07 DIAGNOSIS — M1990 Unspecified osteoarthritis, unspecified site: Secondary | ICD-10-CM | POA: Diagnosis not present

## 2020-03-07 DIAGNOSIS — G894 Chronic pain syndrome: Secondary | ICD-10-CM | POA: Diagnosis not present

## 2020-03-07 DIAGNOSIS — M25561 Pain in right knee: Secondary | ICD-10-CM | POA: Diagnosis not present

## 2020-03-07 NOTE — Addendum Note (Signed)
Addended by: Jaquita Folds on: 03/07/2020 05:09 PM   Modules accepted: Level of Service

## 2020-03-12 ENCOUNTER — Encounter: Payer: Self-pay | Admitting: Family Medicine

## 2020-03-14 ENCOUNTER — Ambulatory Visit (INDEPENDENT_AMBULATORY_CARE_PROVIDER_SITE_OTHER): Payer: Medicare Other | Admitting: Nurse Practitioner

## 2020-03-14 ENCOUNTER — Other Ambulatory Visit: Payer: Self-pay

## 2020-03-14 ENCOUNTER — Encounter: Payer: Self-pay | Admitting: Nurse Practitioner

## 2020-03-14 VITALS — BP 147/85 | HR 74 | Temp 98.2°F | Ht 66.0 in | Wt 170.4 lb

## 2020-03-14 DIAGNOSIS — R030 Elevated blood-pressure reading, without diagnosis of hypertension: Secondary | ICD-10-CM | POA: Diagnosis not present

## 2020-03-14 DIAGNOSIS — R42 Dizziness and giddiness: Secondary | ICD-10-CM | POA: Diagnosis not present

## 2020-03-14 NOTE — Progress Notes (Signed)
BP (!) 147/85   Pulse 74   Temp 98.2 F (36.8 C) (Oral)   Ht _0  (1.676 m)   Wt 170 lb 6.4 oz (77.3 kg)   LMP 09/28/2008 (Approximate)   SpO2 97%   BMI 27.50 kg/m    Subjective:    Patient ID: Diane Pearson, female    DOB: December 12, 1955, 65 y.o.   MRN: [id number]  HPI: Diane Pearson is a 65 y.o. female  Chief Complaint  Patient presents with  . Hypertension    Pt states she has noticed her BP being higher than normal, also notices more dizziness for about a month.   ELEVATED BLOOD PRESSURE Patient was seen at the dentist a couple of times and her blood pressure was 160s/100s.  Patient is currently on Metoprolol 42m for Tremors.  She has also been taking Meloxicam for pain recently.  Hypertension status: Patient has not been diagnosed with hypertension before.  The elevated blood pressures started after increasing her lyrica and taking the Mobic.  Satisfied with current treatment? Metoprolol is for an essential tremor. Duration of hypertension: New. BP monitoring frequency:  not checking BP range: Blood pressure has been elevated at office visits.  BP medication side effects:  no Medication compliance: excellent compliance Previous BP meds:Metoprolol Aspirin: no Recurrent headaches: no Visual changes: no Palpitations: no Dyspnea: no Chest pain: no Lower extremity edema: no Dizzy/lightheaded: yes  DIZZINESS  Patient does not drink a lot of water but does drink a good amount of mountain dew and sweet tea daily.  Duration: months.  Patient states it started when her Lyrica was increased and started taking Mobic, Description of symptoms: Patient feels like the room is spinning. Duration of episode: seconds Dizziness frequency: recurrent Provoking factors: patient notices the most when she closes her eyes in the shower. Aggravating factors:  none Triggered by rolling over in bed: no Triggered by bending over: no Aggravated by head movement: no Aggravated by  exertion, coughing, loud noises: no Recent head injury: no Recent or current viral symptoms: no History of vasovagal episodes: no Nausea: no Vomiting: no Tinnitus: no Hearing loss: no Aural fullness: no Headache: yes Photophobia/phonophobia: yes Unsteady gait: yes Postural instability: yes Diplopia, dysarthria, dysphagia or weakness: yes Related to exertion: no Pallor: no Diaphoresis: no Dyspnea: no Chest pain: no  Relevant past medical, surgical, family and social history reviewed and updated as indicated. Interim medical history since our last visit reviewed. Allergies and medications reviewed and updated.  Review of Systems  Per HPI unless specifically indicated above     Objective:    BP (!) 147/85   Pulse 74   Temp 98.2 F (36.8 C) (Oral)   Ht _1  (1.676 m)   Wt 170 lb 6.4 oz (77.3 kg)   LMP 09/28/2008 (Approximate)   SpO2 97%   BMI 27.50 kg/m   Wt Readings from Last 3 Encounters:  03/14/20 170 lb 6.4 oz (77.3 kg)  01/11/20 168 lb 6.4 oz (76.4 kg)  01/01/20 170 lb (77.1 kg)    Physical Exam  Results for orders placed or performed in visit on 02/27/20  POCT Urinalysis Dipstick  Result Value Ref Range   Color, UA dark yellow    Clarity, UA clear    Glucose, UA Negative Negative   Bilirubin, UA neg    Ketones, UA neg    Spec Grav, UA 1.020 1.010 - 1.025   Blood, UA trace    pH, UA 6.0 5.0 -  8.0   Protein, UA Positive (A) Negative   Urobilinogen, UA 0.2 0.2 or 1.0 E.U./dL   Nitrite, UA neg    Leukocytes, UA Negative Negative   Appearance norm    Odor none       Assessment & Plan:   Problem List Items Addressed This Visit   None   Visit Diagnoses    Elevated blood pressure reading    -  Primary   Patient has had elevated blood pressures since increasing Lyrica and taking Mobic daily.  Will stop mobic and recommend tylenol for pain.  Return in 1 week.   Dizziness       Stop taking mobic.  Decrease Mountain Dew and Sweet Tea intake.  Replace  with water.  Return to clinic in 1 week.  Labs ordered today.    Relevant Orders   Comp Met (CMET)   CBC       Follow up plan: Return in about 1 week (around 03/21/2020) for BP Check.   20 minutes spent face to face with the patient.  32 minutes total spent on this encounter.

## 2020-03-15 LAB — CBC
Hematocrit: 35.2 % (ref 34.0–46.6)
Hemoglobin: 11.9 g/dL (ref 11.1–15.9)
MCH: 30.7 pg (ref 26.6–33.0)
MCHC: 33.8 g/dL (ref 31.5–35.7)
MCV: 91 fL (ref 79–97)
Platelets: 257 10*3/uL (ref 150–450)
RBC: 3.87 x10E6/uL (ref 3.77–5.28)
RDW: 12.5 % (ref 11.7–15.4)
WBC: 6.9 10*3/uL (ref 3.4–10.8)

## 2020-03-15 LAB — COMPREHENSIVE METABOLIC PANEL
ALT: 14 IU/L (ref 0–32)
AST: 17 IU/L (ref 0–40)
Albumin/Globulin Ratio: 1.5 (ref 1.2–2.2)
Albumin: 4.1 g/dL (ref 3.8–4.8)
Alkaline Phosphatase: 111 IU/L (ref 44–121)
BUN/Creatinine Ratio: 15 (ref 12–28)
BUN: 13 mg/dL (ref 8–27)
Bilirubin Total: 0.4 mg/dL (ref 0.0–1.2)
CO2: 24 mmol/L (ref 20–29)
Calcium: 9 mg/dL (ref 8.7–10.3)
Chloride: 104 mmol/L (ref 96–106)
Creatinine, Ser: 0.84 mg/dL (ref 0.57–1.00)
GFR calc Af Amer: 86 mL/min/{1.73_m2} (ref >59)
GFR calc non Af Amer: 74 mL/min/{1.73_m2} (ref >59)
Globulin, Total: 2.7 g/dL (ref 1.5–4.5)
Glucose: 74 mg/dL (ref 65–99)
Potassium: 4 mmol/L (ref 3.5–5.2)
Sodium: 142 mmol/L (ref 134–144)
Total Protein: 6.8 g/dL (ref 6.0–8.5)

## 2020-03-17 NOTE — Progress Notes (Signed)
Blood work looks great.  There is no indication for patient's symptoms.  Recommend patient not take the mobic as discussed in the visit.  We will discuss treatment options further at patient's follow up appointment.

## 2020-03-20 ENCOUNTER — Other Ambulatory Visit: Payer: Self-pay | Admitting: Family Medicine

## 2020-03-21 ENCOUNTER — Telehealth: Payer: Self-pay

## 2020-03-21 ENCOUNTER — Encounter: Payer: Self-pay | Admitting: Family Medicine

## 2020-03-21 ENCOUNTER — Ambulatory Visit (INDEPENDENT_AMBULATORY_CARE_PROVIDER_SITE_OTHER): Payer: Medicare Other | Admitting: Family Medicine

## 2020-03-21 ENCOUNTER — Other Ambulatory Visit: Payer: Self-pay

## 2020-03-21 VITALS — BP 155/84 | HR 76 | Temp 97.6°F | Wt 168.4 lb

## 2020-03-21 DIAGNOSIS — I10 Essential (primary) hypertension: Secondary | ICD-10-CM | POA: Insufficient documentation

## 2020-03-21 DIAGNOSIS — M797 Fibromyalgia: Secondary | ICD-10-CM | POA: Diagnosis not present

## 2020-03-21 MED ORDER — AMLODIPINE BESYLATE 5 MG PO TABS: 5.0000 mg | ORAL_TABLET | Freq: Every day | ORAL | 3 refills | Status: DC

## 2020-03-21 NOTE — Telephone Encounter (Signed)
FYI

## 2020-03-21 NOTE — Progress Notes (Signed)
BP (!) 155/84   Pulse 76   Temp 97.6 F (36.4 C)   Wt 168 lb 6.4 oz (76.4 kg)   LMP 09/28/2008 (Approximate)   SpO2 98%   BMI 27.18 kg/m    Subjective:    Patient ID: Diane Pearson, female    DOB: August 11, 1955, 65 y.o.   MRN: [id number]  HPI: Diane Pearson is a 65 y.o. female  Chief Complaint  Patient presents with  . Hypertension    Blood pressure    With her mobic and lyrica she was without pain for the first time in years. She had been feeling well. She is feeling badly again after having to stop them.   HYPERTENSION Hypertension status: uncontrolled  Satisfied with current treatment? no Duration of hypertension: past few months BP monitoring frequency:  a few times a week BP range: 150s/80s BP medication side effects:  N/A Aspirin: no Recurrent headaches: no Visual changes: no Palpitations: no Dyspnea: no Chest pain: no Lower extremity edema: no Dizzy/lightheaded: no  Relevant past medical, surgical, family and social history reviewed and updated as indicated. Interim medical history since our last visit reviewed. Allergies and medications reviewed and updated.  Review of Systems  Constitutional: Negative.   Respiratory: Negative.   Cardiovascular: Negative.   Gastrointestinal: Negative.   Musculoskeletal: Positive for myalgias. Negative for arthralgias, back pain, gait problem, joint swelling, neck pain and neck stiffness.  Skin: Negative.   Psychiatric/Behavioral: Negative.     Per HPI unless specifically indicated above     Objective:    BP (!) 155/84   Pulse 76   Temp 97.6 F (36.4 C)   Wt 168 lb 6.4 oz (76.4 kg)   LMP 09/28/2008 (Approximate)   SpO2 98%   BMI 27.18 kg/m   Wt Readings from Last 3 Encounters:  03/21/20 168 lb 6.4 oz (76.4 kg)  03/14/20 170 lb 6.4 oz (77.3 kg)  01/11/20 168 lb 6.4 oz (76.4 kg)    Physical Exam Vitals and nursing note reviewed.  Constitutional:      General: She is not in acute distress.     Appearance: Normal appearance. She is not ill-appearing, toxic-appearing or diaphoretic.  HENT:     Head: Normocephalic and atraumatic.     Right Ear: External ear normal.     Left Ear: External ear normal.     Nose: Nose normal.     Mouth/Throat:     Mouth: Mucous membranes are moist.     Pharynx: Oropharynx is clear.  Eyes:     General: No scleral icterus.       Right eye: No discharge.        Left eye: No discharge.     Extraocular Movements: Extraocular movements intact.     Conjunctiva/sclera: Conjunctivae normal.     Pupils: Pupils are equal, round, and reactive to light.  Cardiovascular:     Rate and Rhythm: Normal rate and regular rhythm.     Pulses: Normal pulses.     Heart sounds: Normal heart sounds. No murmur heard. No friction rub. No gallop.   Pulmonary:     Effort: Pulmonary effort is normal. No respiratory distress.     Breath sounds: Normal breath sounds. No stridor. No wheezing, rhonchi or rales.  Chest:     Chest wall: No tenderness.  Musculoskeletal:        General: Normal range of motion.     Cervical back: Normal range of motion and neck supple.  Skin:    General: Skin is warm and dry.     Capillary Refill: Capillary refill takes less than 2 seconds.     Coloration: Skin is not jaundiced or pale.     Findings: No bruising, erythema, lesion or rash.  Neurological:     General: No focal deficit present.     Mental Status: She is alert and oriented to person, place, and time. Mental status is at baseline.  Psychiatric:        Mood and Affect: Mood normal.        Behavior: Behavior normal.        Thought Content: Thought content normal.        Judgment: Judgment normal.     Results for orders placed or performed in visit on 03/14/20  Comp Met (CMET)  Result Value Ref Range   Glucose 74 65 - 99 mg/dL   BUN 13 8 - 27 mg/dL   Creatinine, Ser 0.84 0.57 - 1.00 mg/dL   GFR calc non Af Amer 74 >59 mL/min/1.73   GFR calc Af Amer 86 >59 mL/min/1.73    BUN/Creatinine Ratio 15 12 - 28   Sodium 142 134 - 144 mmol/L   Potassium 4.0 3.5 - 5.2 mmol/L   Chloride 104 96 - 106 mmol/L   CO2 24 20 - 29 mmol/L   Calcium 9.0 8.7 - 10.3 mg/dL   Total Protein 6.8 6.0 - 8.5 g/dL   Albumin 4.1 3.8 - 4.8 g/dL   Globulin, Total 2.7 1.5 - 4.5 g/dL   Albumin/Globulin Ratio 1.5 1.2 - 2.2   Bilirubin Total 0.4 0.0 - 1.2 mg/dL   Alkaline Phosphatase 111 44 - 121 IU/L   AST 17 0 - 40 IU/L   ALT 14 0 - 32 IU/L  CBC  Result Value Ref Range   WBC 6.9 3.4 - 10.8 x10E3/uL   RBC 3.87 3.77 - 5.28 x10E6/uL   Hemoglobin 11.9 11.1 - 15.9 g/dL   Hematocrit 35.2 34.0 - 46.6 %   MCV 91 79 - 97 fL   MCH 30.7 26.6 - 33.0 pg   MCHC 33.8 31.5 - 35.7 g/dL   RDW 12.5 11.7 - 15.4 %   Platelets 257 150 - 450 x10E3/uL      Assessment & Plan:   Problem List Items Addressed This Visit      Cardiovascular and Mediastinum   Hypertension - Primary    Discussed risks and benefits- we decided it was better for her to start blood pressure medicine in order to be able to stay on her lyrica and mobic to help with her pain. Starting amlodipine today. Recheck 2 weeks.       Relevant Medications   amLODipine (NORVASC) 5 MG tablet     Other   Fibromyalgia    Discussed risks and benefits- we decided it was better for her to start blood pressure medicine in order to be able to stay on her lyrica and mobic to help with her pain. Starting amlodipine today. Recheck 2 weeks.           Follow up plan: Return in about 2 weeks (around 04/04/2020).

## 2020-03-21 NOTE — Telephone Encounter (Signed)
Copied from [place] [phone]. Topic: General - Other >> Mar 21, 2020  4:01 PM Erick Blinks wrote: Reason for CRM: Pt called back to report that she enough meloxicam, will not need a refill at this time.

## 2020-03-23 ENCOUNTER — Encounter: Payer: Self-pay | Admitting: Family Medicine

## 2020-03-23 NOTE — Assessment & Plan Note (Signed)
Discussed risks and benefits- we decided it was better for her to start blood pressure medicine in order to be able to stay on her lyrica and mobic to help with her pain. Starting amlodipine today. Recheck 2 weeks.

## 2020-04-01 ENCOUNTER — Other Ambulatory Visit: Payer: Self-pay

## 2020-04-01 ENCOUNTER — Ambulatory Visit (INDEPENDENT_AMBULATORY_CARE_PROVIDER_SITE_OTHER): Payer: Medicare Other | Admitting: Dermatology

## 2020-04-01 DIAGNOSIS — L820 Inflamed seborrheic keratosis: Secondary | ICD-10-CM | POA: Diagnosis not present

## 2020-04-01 DIAGNOSIS — L309 Dermatitis, unspecified: Secondary | ICD-10-CM | POA: Diagnosis not present

## 2020-04-01 DIAGNOSIS — L719 Rosacea, unspecified: Secondary | ICD-10-CM

## 2020-04-01 DIAGNOSIS — L821 Other seborrheic keratosis: Secondary | ICD-10-CM | POA: Diagnosis not present

## 2020-04-01 MED ORDER — MOMETASONE FUROATE 0.1 % EX CREA: TOPICAL_CREAM | CUTANEOUS | 1 refills | Status: DC

## 2020-04-01 MED ORDER — IVERMECTIN 1 % EX CREA: TOPICAL_CREAM | CUTANEOUS | 2 refills | Status: DC

## 2020-04-01 NOTE — Patient Instructions (Addendum)
Cryotherapy Aftercare  . Wash gently with soap and water everyday.   Marland Kitchen Apply Vaseline and Band-Aid daily until healed.   Rosacea  What is rosacea? Rosacea (say: ro-zay-sha) is a common skin disease that usually begins as a trend of flushing or blushing easily.  As rosacea progresses, a persistent redness in the center of the face will develop and may gradually spread beyond the nose and cheeks to the forehead and chin.  In some cases, the ears, chest, and back could be affected.  Rosacea may appear as tiny blood vessels or small red bumps that occur in crops.  Frequently they can contain pus, and are called "pustules".  If the bumps do not contain pus, they are referred to as "papules".  Rarely, in prolonged, untreated cases of rosacea, the oil glands of the nose and cheeks may become permanently enlarged.  This is called rhinophyma, and is seen more frequently in men.  Signs and Risks In its beginning stages, rosacea tends to come and go, which makes it difficult to recognize.  It can start as intermittent flushing of the face.  Eventually, blood vessels may become permanently visible.  Pustules and papules can appear, but can be mistaken for adult acne.  People of all races, ages, genders and ethnic groups are at risk of developing rosacea.  However, it is more common in women (especially around menopause) and adults with fair skin between the ages of 67 and 43.  Treatment Dermatologists typically recommend a combination of treatments to effectively manage rosacea.  Treatment can improve symptoms and may stop the progression of the rosacea.  Treatment may involve both topical and oral medications.  The tetracycline antibiotics are often used for their anti-inflammatory effect; however, because of the possibility of developing antibiotic resistance, they should not be used long term at full dose.  For dilated blood vessels the options include electrodessication (uses electric current through a small  needle), laser treatment, and cosmetics to hide the redness.   With all forms of treatment, improvement is a slow process, and patients may not see any results for the first 3-4 weeks.  It is very important to avoid the sun and other triggers.  Patients must wear sunscreen daily.  Skin Care Instructions: 1. Cleanse the skin with a mild soap such as CeraVe cleanser, Cetaphil cleanser, or Dove soap once or twice daily as needed. 2. Moisturize with Eucerin Redness Relief Daily Perfecting Lotion (has a subtle green tint), CeraVe Moisturizing Cream, or Oil of Olay Daily Moisturizer with sunscreen every morning and/or night as recommended. 3. Makeup should be "non-comedogenic" (won't clog pores) and be labeled "for sensitive skin". Good choices for cosmetics are: Neutrogena, Almay, and Physician's Formula.  Any product with a green tint tends to offset a red complexion. 4. If your eyes are dry and irritated, use artificial tears 2-3 times per day and cleanse the eyelids daily with baby shampoo.  Have your eyes examined at least every 2 years.  Be sure to tell your eye doctor that you have rosacea. 5. Alcoholic beverages tend to cause flushing of the skin, and may make rosacea worse. 6. Always wear sunscreen, protect your skin from extreme hot and cold temperatures, and avoid spicy foods, hot drinks, and mechanical irritation such as rubbing, scrubbing, or massaging the face.  Avoid harsh skin cleansers, cleansing masks, astringents, and exfoliation. If a particular product burns or makes your face feel tight, then it is likely to flare your rosacea. 7. If you are  having difficulty finding a sunscreen that you can tolerate, you may try switching to a chemical-free sunscreen.  These are ones whose active ingredient is zinc oxide or titanium dioxide only.  They should also be fragrance free, non-comedogenic, and labeled for sensitive skin. 8. Rosacea triggers may vary from person to person.  There are a variety of  foods that have been reported to trigger rosacea.  Some patients find that keeping a diary of what they were doing when they flared helps them avoid triggers.    Seborrheic Keratosis  What causes seborrheic keratoses? Seborrheic keratoses are harmless, common skin growths that first appear during adult life.  As time goes by, more growths appear.  Some people may develop a large number of them.  Seborrheic keratoses appear on both covered and uncovered body parts.  They are not caused by sunlight.  The tendency to develop seborrheic keratoses can be inherited.  They vary in color from skin-colored to gray, brown, or even black.  They can be either smooth or have a rough, warty surface.   Seborrheic keratoses are superficial and look as if they were stuck on the skin.  Under the microscope this type of keratosis looks like layers upon layers of skin.  That is why at times the top layer may seem to fall off, but the rest of the growth remains and re-grows.    Treatment Seborrheic keratoses do not need to be treated, but can easily be removed in the office.  Seborrheic keratoses often cause symptoms when they rub on clothing or jewelry.  Lesions can be in the way of shaving.  If they become inflamed, they can cause itching, soreness, or burning.  Removal of a seborrheic keratosis can be accomplished by freezing, burning, or surgery. If any spot bleeds, scabs, or grows rapidly, please return to have it checked, as these can be an indication of a skin cancer.     Gentle Skin Care Guide  1. Bathe no more than once a day.  2. Avoid bathing in hot water  3. Use a mild soap like Dove, Vanicream, Cetaphil, CeraVe. Can use Lever 2000 or Cetaphil antibacterial soap  4. Use soap only where you need it. On most days, use it under your arms, between your legs, and on your feet. Let the water rinse other areas unless visibly dirty.  5. When you get out of the bath/shower, use a towel to gently blot your  skin dry, don't rub it.  6. While your skin is still a little damp, apply a moisturizing cream such as Vanicream, CeraVe, Cetaphil, Eucerin, Sarna lotion or plain Vaseline Jelly. For hands apply Neutrogena [place] Hand Cream or Excipial Hand Cream.  7. Reapply moisturizer any time you start to itch or feel dry.  8. Sometimes using free and clear laundry detergents can be helpful. Fabric softener sheets should be avoided. Downy Free & Gentle liquid, or any liquid fabric softener that is free of dyes and perfumes, it acceptable to use  9. If your doctor has given you prescription creams you may apply moisturizers over them

## 2020-04-01 NOTE — Progress Notes (Signed)
   New Patient Visit  Subjective  Diane Pearson is a 65 y.o. female who presents for the following: Skin Problem (Pt reports possible eczema like rash on her back, hands, tops of feet, and legs. Clear on hands today. ) and Rosacea (Pt would like to know if there is anything we can do for her rosacea.). Itchy spots on back and hand.  Picks at.  No hx of skin cancer or dysplastic nevi.   Objective  Well appearing patient in no apparent distress; mood and affect are within normal limits.  A focused examination was performed including back, legs, feet, hands, face. Relevant physical exam findings are noted in the Assessment and Plan.  Objective  Left Lower Leg - Anterior: Pink scaly patch left pretibia  Objective  spinal lower back x 1, left hand dorsum x 1 (2): Erythematous keratotic or waxy stuck-on papule   Objective  nose, alar cheeks: Mid face erythema with telangiectasias   Assessment & Plan  Eczema, unspecified type Left Lower Leg - Anterior  Chronic condition with mild/mod flare on leg Start mometasone 0.1% cream. Apply QD-BID daily to aa's when red and itchy until clear  Recommend mild soap and moisturizing cream 1-2 times daily.    Topical steroids (such as triamcinolone, fluocinolone, fluocinonide, mometasone, clobetasol, halobetasol, betamethasone, hydrocortisone) can cause thinning and lightening of the skin if they are used for too long in the same area. Your physician has selected the right strength medicine for your problem and area affected on the body. Please use your medication only as directed by your physician to prevent side effects.    mometasone (ELOCON) 0.1 % cream - Left Lower Leg - Anterior  Inflamed seborrheic keratosis (2) spinal lower back x 1, left hand dorsum x 1  Prior to procedure, discussed risks of blister formation, small wound, skin dyspigmentation, or rare scar following cryotherapy.    Destruction of lesion - spinal lower back x 1, left  hand dorsum x 1  Destruction method: cryotherapy   Informed consent: discussed and consent obtained   Lesion destroyed using liquid nitrogen: Yes   Region frozen until ice ball extended beyond lesion: Yes   Outcome: patient tolerated procedure well with no complications   Post-procedure details: wound care instructions given    Rosacea nose, alar cheeks  Start Soolantra cream. Apply to affected areas on face qhs.   Recommend applying a moisturizer with SPF to the face in the mornings.   Rosacea is a chronic progressive skin condition usually affecting the face of adults, causing redness and/or acne bumps. It is treatable but not curable. It sometimes affects the eyes (ocular rosacea) as well. It may respond to topical and/or systemic medication and can flare with stress, sun exposure, alcohol, exercise and some foods.  Daily application of broad spectrum spf 30+ sunscreen to face is recommended to reduce flares.   Ivermectin (SOOLANTRA) 1 % CREA - nose, alar cheeks  Seborrheic Keratoses - Stuck-on, waxy, tan-brown papules and plaques  - Discussed benign etiology and prognosis. - Observe - Call for any changes  Return if symptoms worsen or fail to improve.   I, Harriett Sine, CMA, am acting as scribe for Brendolyn Patty, MD.  Documentation: I have reviewed the above documentation for accuracy and completeness, and I agree with the above.  Brendolyn Patty MD

## 2020-04-02 ENCOUNTER — Encounter: Payer: Self-pay | Admitting: Dermatology

## 2020-04-07 ENCOUNTER — Telehealth: Payer: Self-pay

## 2020-04-07 DIAGNOSIS — L719 Rosacea, unspecified: Secondary | ICD-10-CM

## 2020-04-07 MED ORDER — FINACEA 15 % EX FOAM: CUTANEOUS | 2 refills | Status: DC

## 2020-04-07 NOTE — Telephone Encounter (Signed)
Diane Pearson was denied by insurance. Pt must try and fail 2 of the following: - finacea foam or azeliac acid - rosadan or metronidazole cream

## 2020-04-07 NOTE — Telephone Encounter (Signed)
Rx sent and pt notified.

## 2020-04-07 NOTE — Telephone Encounter (Signed)
Can send in [place] foam qd/bid to mid face

## 2020-04-07 NOTE — Addendum Note (Signed)
Addended by: Harriett Sine on: 04/07/2020 01:48 PM   Modules accepted: Orders

## 2020-04-10 ENCOUNTER — Other Ambulatory Visit: Payer: Self-pay

## 2020-04-10 ENCOUNTER — Ambulatory Visit (INDEPENDENT_AMBULATORY_CARE_PROVIDER_SITE_OTHER): Payer: Medicare Other | Admitting: Family Medicine

## 2020-04-10 ENCOUNTER — Encounter: Payer: Self-pay | Admitting: Family Medicine

## 2020-04-10 VITALS — BP 125/80 | HR 76 | Temp 98.2°F | Wt 168.8 lb

## 2020-04-10 DIAGNOSIS — I10 Essential (primary) hypertension: Secondary | ICD-10-CM

## 2020-04-10 MED ORDER — AMLODIPINE BESYLATE 5 MG PO TABS: 5.0000 mg | ORAL_TABLET | Freq: Every day | ORAL | 1 refills | Status: DC

## 2020-04-10 NOTE — Progress Notes (Signed)
BP 125/80   Pulse 76   Temp 98.2 F (36.8 C)   Wt 168 lb 12.8 oz (76.6 kg)   LMP 09/28/2008 (Approximate)   SpO2 96%   BMI 27.25 kg/m    Subjective:    Patient ID: Diane Pearson, female    DOB: [birth year], 65 y.o.   MRN: [id number]  HPI: Diane Pearson is a 65 y.o. female  Chief Complaint  Patient presents with  . Hypertension   HYPERTENSION Hypertension status: better  Satisfied with current treatment? yes Duration of hypertension: chronic BP monitoring frequency:  not checking BP medication side effects:  no Medication compliance: excellent compliance Previous BP meds: metoprolol, amlodipine Aspirin: yes Recurrent headaches: no Visual changes: no Palpitations: no Dyspnea: no Chest pain: no Lower extremity edema: no Dizzy/lightheaded: no  Relevant past medical, surgical, family and social history reviewed and updated as indicated. Interim medical history since our last visit reviewed. Allergies and medications reviewed and updated.  Review of Systems  Constitutional: Negative.   Respiratory: Negative.   Cardiovascular: Negative.   Gastrointestinal: Negative.   Musculoskeletal: Positive for arthralgias and myalgias. Negative for back pain, gait problem, joint swelling, neck pain and neck stiffness.  Psychiatric/Behavioral: Negative.     Per HPI unless specifically indicated above     Objective:    BP 125/80   Pulse 76   Temp 98.2 F (36.8 C)   Wt 168 lb 12.8 oz (76.6 kg)   LMP 09/28/2008 (Approximate)   SpO2 96%   BMI 27.25 kg/m   Wt Readings from Last 3 Encounters:  04/10/20 168 lb 12.8 oz (76.6 kg)  03/21/20 168 lb 6.4 oz (76.4 kg)  03/14/20 170 lb 6.4 oz (77.3 kg)    Physical Exam Vitals and nursing note reviewed.  Constitutional:      General: She is not in acute distress.    Appearance: Normal appearance. She is not ill-appearing, toxic-appearing or diaphoretic.  HENT:     Head: Normocephalic and atraumatic.     Right Ear: External  ear normal.     Left Ear: External ear normal.     Nose: Nose normal.     Mouth/Throat:     Mouth: Mucous membranes are moist.     Pharynx: Oropharynx is clear.  Eyes:     General: No scleral icterus.       Right eye: No discharge.        Left eye: No discharge.     Extraocular Movements: Extraocular movements intact.     Conjunctiva/sclera: Conjunctivae normal.     Pupils: Pupils are equal, round, and reactive to light.  Cardiovascular:     Rate and Rhythm: Normal rate and regular rhythm.     Pulses: Normal pulses.     Heart sounds: Normal heart sounds. No murmur heard. No friction rub. No gallop.   Pulmonary:     Effort: Pulmonary effort is normal. No respiratory distress.     Breath sounds: Normal breath sounds. No stridor. No wheezing, rhonchi or rales.  Chest:     Chest wall: No tenderness.  Musculoskeletal:        General: Normal range of motion.     Cervical back: Normal range of motion and neck supple.  Skin:    General: Skin is warm and dry.     Capillary Refill: Capillary refill takes less than 2 seconds.     Coloration: Skin is not jaundiced or pale.     Findings: No bruising, erythema,  lesion or rash.  Neurological:     General: No focal deficit present.     Mental Status: She is alert and oriented to person, place, and time. Mental status is at baseline.  Psychiatric:        Mood and Affect: Mood normal.        Behavior: Behavior normal.        Thought Content: Thought content normal.        Judgment: Judgment normal.     Results for orders placed or performed in visit on 03/14/20  Comp Met (CMET)  Result Value Ref Range   Glucose 74 65 - 99 mg/dL   BUN 13 8 - 27 mg/dL   Creatinine, Ser 0.84 0.57 - 1.00 mg/dL   GFR calc non Af Amer 74 >59 mL/min/1.73   GFR calc Af Amer 86 >59 mL/min/1.73   BUN/Creatinine Ratio 15 12 - 28   Sodium 142 134 - 144 mmol/L   Potassium 4.0 3.5 - 5.2 mmol/L   Chloride 104 96 - 106 mmol/L   CO2 24 20 - 29 mmol/L   Calcium  9.0 8.7 - 10.3 mg/dL   Total Protein 6.8 6.0 - 8.5 g/dL   Albumin 4.1 3.8 - 4.8 g/dL   Globulin, Total 2.7 1.5 - 4.5 g/dL   Albumin/Globulin Ratio 1.5 1.2 - 2.2   Bilirubin Total 0.4 0.0 - 1.2 mg/dL   Alkaline Phosphatase 111 44 - 121 IU/L   AST 17 0 - 40 IU/L   ALT 14 0 - 32 IU/L  CBC  Result Value Ref Range   WBC 6.9 3.4 - 10.8 x10E3/uL   RBC 3.87 3.77 - 5.28 x10E6/uL   Hemoglobin 11.9 11.1 - 15.9 g/dL   Hematocrit 35.2 34.0 - 46.6 %   MCV 91 79 - 97 fL   MCH 30.7 26.6 - 33.0 pg   MCHC 33.8 31.5 - 35.7 g/dL   RDW 12.5 11.7 - 15.4 %   Platelets 257 150 - 450 x10E3/uL      Assessment & Plan:   Problem List Items Addressed This Visit      Cardiovascular and Mediastinum   Hypertension - Primary    Under good control on current regimen. Continue current regimen. Continue to monitor. Call with any concerns. Refills given. Labs drawn today.        Relevant Medications   amLODipine (NORVASC) 5 MG tablet   Other Relevant Orders   Basic metabolic panel       Follow up plan: Return in about 3 months (around 07/11/2020) for 6 month follow up.

## 2020-04-10 NOTE — Assessment & Plan Note (Signed)
Under good control on current regimen. Continue current regimen. Continue to monitor. Call with any concerns. Refills given. Labs drawn today.   

## 2020-04-11 LAB — BASIC METABOLIC PANEL
BUN/Creatinine Ratio: 22 (ref 12–28)
BUN: 21 mg/dL (ref 8–27)
CO2: 22 mmol/L (ref 20–29)
Calcium: 9.2 mg/dL (ref 8.7–10.3)
Chloride: 101 mmol/L (ref 96–106)
Creatinine, Ser: 0.94 mg/dL (ref 0.57–1.00)
Glucose: 83 mg/dL (ref 65–99)
Potassium: 4.8 mmol/L (ref 3.5–5.2)
Sodium: 141 mmol/L (ref 134–144)
eGFR: 68 mL/min/{1.73_m2} (ref >59)

## 2020-04-18 ENCOUNTER — Other Ambulatory Visit: Payer: Self-pay | Admitting: Family Medicine

## 2020-05-06 DIAGNOSIS — I700 Atherosclerosis of aorta: Secondary | ICD-10-CM | POA: Insufficient documentation

## 2020-05-19 ENCOUNTER — Other Ambulatory Visit: Payer: Self-pay | Admitting: Family Medicine

## 2020-05-28 DIAGNOSIS — M9903 Segmental and somatic dysfunction of lumbar region: Secondary | ICD-10-CM | POA: Diagnosis not present

## 2020-05-28 DIAGNOSIS — M545 Low back pain, unspecified: Secondary | ICD-10-CM | POA: Diagnosis not present

## 2020-06-03 DIAGNOSIS — M797 Fibromyalgia: Secondary | ICD-10-CM | POA: Diagnosis not present

## 2020-06-03 DIAGNOSIS — M7918 Myalgia, other site: Secondary | ICD-10-CM | POA: Diagnosis not present

## 2020-06-24 ENCOUNTER — Encounter: Payer: Self-pay | Admitting: Family Medicine

## 2020-07-11 ENCOUNTER — Encounter: Payer: Self-pay | Admitting: Family Medicine

## 2020-07-11 ENCOUNTER — Other Ambulatory Visit: Payer: Self-pay

## 2020-07-11 ENCOUNTER — Ambulatory Visit (INDEPENDENT_AMBULATORY_CARE_PROVIDER_SITE_OTHER): Payer: Medicare Other | Admitting: Family Medicine

## 2020-07-11 VITALS — BP 116/78 | HR 74 | Temp 98.2°F | Wt 169.2 lb

## 2020-07-11 DIAGNOSIS — F332 Major depressive disorder, recurrent severe without psychotic features: Secondary | ICD-10-CM

## 2020-07-11 DIAGNOSIS — I700 Atherosclerosis of aorta: Secondary | ICD-10-CM | POA: Diagnosis not present

## 2020-07-11 DIAGNOSIS — I10 Essential (primary) hypertension: Secondary | ICD-10-CM

## 2020-07-11 DIAGNOSIS — E039 Hypothyroidism, unspecified: Secondary | ICD-10-CM | POA: Diagnosis not present

## 2020-07-11 DIAGNOSIS — E782 Mixed hyperlipidemia: Secondary | ICD-10-CM

## 2020-07-11 DIAGNOSIS — M797 Fibromyalgia: Secondary | ICD-10-CM | POA: Diagnosis not present

## 2020-07-11 DIAGNOSIS — F5101 Primary insomnia: Secondary | ICD-10-CM

## 2020-07-11 MED ORDER — HYDROXYZINE HCL 25 MG PO TABS: 25.0000 mg | ORAL_TABLET | Freq: Every day | ORAL | 1 refills | Status: DC

## 2020-07-11 MED ORDER — MELOXICAM 7.5 MG PO TABS: ORAL_TABLET | ORAL | 1 refills | Status: DC

## 2020-07-11 MED ORDER — DULOXETINE HCL 60 MG PO CPEP: 60.0000 mg | ORAL_CAPSULE | Freq: Every day | ORAL | 1 refills | Status: DC

## 2020-07-11 MED ORDER — CYCLOBENZAPRINE HCL 10 MG PO TABS: ORAL_TABLET | ORAL | 0 refills | Status: DC

## 2020-07-11 MED ORDER — PREGABALIN 50 MG PO CAPS: 50.0000 mg | ORAL_CAPSULE | Freq: Every day | ORAL | 1 refills | Status: DC

## 2020-07-11 MED ORDER — METOPROLOL SUCCINATE ER 50 MG PO TB24: ORAL_TABLET | ORAL | 1 refills | Status: DC

## 2020-07-11 MED ORDER — ZOLPIDEM TARTRATE 10 MG PO TABS
10.0000 mg | ORAL_TABLET | Freq: Every day | ORAL | 5 refills | Status: DC
Start: 2020-07-11 — End: 2021-01-12

## 2020-07-11 MED ORDER — PREGABALIN 100 MG PO CAPS: 100.0000 mg | ORAL_CAPSULE | Freq: Two times a day (BID) | ORAL | 1 refills | Status: DC

## 2020-07-11 MED ORDER — AMLODIPINE BESYLATE 5 MG PO TABS: 5.0000 mg | ORAL_TABLET | Freq: Every day | ORAL | 1 refills | Status: DC

## 2020-07-11 NOTE — Assessment & Plan Note (Signed)
Will keep BP and cholesterol under good control. Continue to monitor. Call with any concerns.  

## 2020-07-11 NOTE — Assessment & Plan Note (Signed)
Under good control on current regimen. Continue current regimen. Continue to monitor. Call with any concerns. Refills given. Lab drawn today.

## 2020-07-11 NOTE — Assessment & Plan Note (Signed)
Under good control on current regimen. Continue current regimen. Continue to monitor. Call with any concerns. Refills given. Labs drawn today.   

## 2020-07-11 NOTE — Assessment & Plan Note (Signed)
Rechecking labs today. Await results. Treat as needed.  °

## 2020-07-11 NOTE — Progress Notes (Signed)
BP 116/78   Pulse 74   Temp 98.2 F (36.8 C) (Oral)   Wt 169 lb 3.2 oz (76.7 kg)   LMP 09/28/2008 (Approximate)   SpO2 98%   BMI 27.31 kg/m    Subjective:    Patient ID: Diane Pearson, female    DOB: 05-02-1955, 65 y.o.   MRN: [id number]  HPI: Diane Pearson is a 65 y.o. female  Chief Complaint  Patient presents with  . Hypertension   HYPERTENSION / [place] Satisfied with current treatment? yes Duration of hypertension: chronic BP monitoring frequency: not checking BP medication side effects: no Past BP meds: amlodipine Duration of hyperlipidemia: chronic Cholesterol medication side effects: no Cholesterol supplements: none Past cholesterol medications: none Medication compliance: excellent compliance Aspirin: no Recent stressors: no Recurrent headaches: no Visual changes: no Palpitations: no Dyspnea: no Chest pain: no Lower extremity edema: no Dizzy/lightheaded: no  HYPOTHYROIDISM Thyroid control status:controlled Satisfied with current treatment? yes Medication side effects: no Medication compliance: excellent compliance Etiology of hypothyroidism:  Recent dose adjustment:no Fatigue: no Cold intolerance: no Heat intolerance: no Weight gain: no Weight loss: no Constipation: no Diarrhea/loose stools: no Palpitations: no Lower extremity edema: no Anxiety/depressed mood: no  DEPRESSION Mood status: controlled Satisfied with current treatment?: yes Symptom severity: mild  Duration of current treatment : chronic Side effects: no Medication compliance: excellent compliance Psychotherapy/counseling: no  Previous psychiatric medications: cymbalta Depressed mood: yes Anxious mood: no Anhedonia: no Significant weight loss or gain: no Insomnia: no  Fatigue: yes Feelings of worthlessness or guilt: no Impaired concentration/indecisiveness: no Suicidal ideations: no Hopelessness: no Crying spells: no Depression screen [facility] 2/9 07/11/2020  04/10/2020 10/08/2019 05/30/2019 01/09/2019  Decreased Interest 0 0 0 1 1  Down, Depressed, Hopeless 0 0 2 1 0  PHQ - 2 Score 0 0 '2 2 1  ' Altered sleeping 0 1 0 1 -  Tired, decreased energy 0 0 3 1 -  Change in appetite 0 0 0 0 -  Feeling bad or failure about yourself  0 0 0 0 -  Trouble concentrating 0 '1 3 1 ' -  Moving slowly or fidgety/restless 0 0 0 0 -  Suicidal thoughts 0 0 0 0 -  PHQ-9 Score 0 '2 8 5 ' -  Difficult doing work/chores - Not difficult at all Somewhat difficult Somewhat difficult -  Some recent data might be hidden    FIBROMYALGIA Pain status: stable Satisfied with current treatment?: yes Medication side effects: no Medication compliance: excellent compliance Duration: chronic  Location: widespread Quality: aching and sore Current pain level: mild Previous pain level: moderate Aggravating factors: activity Alleviating factors: rest Previous pain specialty evaluation: no Non-narcotic analgesic meds: yes Narcotic contract:no Treatments attempted: rest, ice and heat   INSOMNIA Duration: years Satisfied with sleep quality: yes Difficulty falling asleep: yes Difficulty staying asleep: no Waking a few hours after sleep onset: no Early morning awakenings: no Daytime hypersomnolence: no Wakes feeling refreshed: no Good sleep hygiene: no Apnea: no Snoring: no Depressed/anxious mood: yes Recent stress: no Restless legs/nocturnal leg cramps: no Chronic pain/arthritis: no History of sleep study: no Treatments attempted: melatonin, uinsom, benadryl and ambien    Relevant past medical, surgical, family and social history reviewed and updated as indicated. Interim medical history since our last visit reviewed. Allergies and medications reviewed and updated.  Review of Systems  Constitutional: Negative.   Respiratory: Negative.   Cardiovascular: Negative.   Gastrointestinal: Negative.   Musculoskeletal: Positive for back pain. Negative for arthralgias, gait  problem, joint swelling, myalgias, neck pain and neck stiffness.  Skin: Negative.   Neurological: Negative.   Psychiatric/Behavioral: Negative.     Per HPI unless specifically indicated above     Objective:    BP 116/78   Pulse 74   Temp 98.2 F (36.8 C) (Oral)   Wt 169 lb 3.2 oz (76.7 kg)   LMP 09/28/2008 (Approximate)   SpO2 98%   BMI 27.31 kg/m   Wt Readings from Last 3 Encounters:  07/11/20 169 lb 3.2 oz (76.7 kg)  04/10/20 168 lb 12.8 oz (76.6 kg)  03/21/20 168 lb 6.4 oz (76.4 kg)    Physical Exam Vitals and nursing note reviewed.  Constitutional:      General: She is not in acute distress.    Appearance: Normal appearance. She is not ill-appearing, toxic-appearing or diaphoretic.  HENT:     Head: Normocephalic and atraumatic.     Right Ear: External ear normal.     Left Ear: External ear normal.     Nose: Nose normal.     Mouth/Throat:     Mouth: Mucous membranes are moist.     Pharynx: Oropharynx is clear.  Eyes:     General: No scleral icterus.       Right eye: No discharge.        Left eye: No discharge.     Extraocular Movements: Extraocular movements intact.     Conjunctiva/sclera: Conjunctivae normal.     Pupils: Pupils are equal, round, and reactive to light.  Cardiovascular:     Rate and Rhythm: Normal rate and regular rhythm.     Pulses: Normal pulses.     Heart sounds: Normal heart sounds. No murmur heard. No friction rub. No gallop.   Pulmonary:     Effort: Pulmonary effort is normal. No respiratory distress.     Breath sounds: Normal breath sounds. No stridor. No wheezing, rhonchi or rales.  Chest:     Chest wall: No tenderness.  Musculoskeletal:        General: Normal range of motion.     Cervical back: Normal range of motion and neck supple.  Skin:    General: Skin is warm and dry.     Capillary Refill: Capillary refill takes less than 2 seconds.     Coloration: Skin is not jaundiced or pale.     Findings: No bruising, erythema, lesion  or rash.  Neurological:     General: No focal deficit present.     Mental Status: She is alert and oriented to person, place, and time. Mental status is at baseline.  Psychiatric:        Mood and Affect: Mood normal.        Behavior: Behavior normal.        Thought Content: Thought content normal.        Judgment: Judgment normal.     Results for orders placed or performed in visit on 97/67/34  Basic metabolic panel  Result Value Ref Range   Glucose 83 65 - 99 mg/dL   BUN 21 8 - 27 mg/dL   Creatinine, Ser 0.94 0.57 - 1.00 mg/dL   eGFR 68 >59 mL/min/1.73   BUN/Creatinine Ratio 22 12 - 28   Sodium 141 134 - 144 mmol/L   Potassium 4.8 3.5 - 5.2 mmol/L   Chloride 101 96 - 106 mmol/L   CO2 22 20 - 29 mmol/L   Calcium 9.2 8.7 - 10.3 mg/dL      Assessment &  Plan:   Problem List Items Addressed This Visit      Cardiovascular and Mediastinum   Hypertension    Under good control on current regimen. Continue current regimen. Continue to monitor. Call with any concerns. Refills given. Lab drawn today.       Relevant Medications   metoprolol succinate (TOPROL-XL) 50 MG 24 hr tablet   amLODipine (NORVASC) 5 MG tablet   Other Relevant Orders   CBC with Differential/Platelet   Comprehensive metabolic panel   Aortic atherosclerosis ([place]) - Primary    Will keep BP and cholesterol under good control. Continue to monitor. Call with any concerns.       Relevant Medications   metoprolol succinate (TOPROL-XL) 50 MG 24 hr tablet   amLODipine (NORVASC) 5 MG tablet   Other Relevant Orders   CBC with Differential/Platelet   Comprehensive metabolic panel   Lipid Panel w/o Chol/HDL Ratio     Endocrine   Hypothyroid    Rechecking labs today. Await results. Treat as needed.       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 Continue current regimen. Continue to monitor. Call with any concerns. Refills given. Lab drawn today.      Relevant Medications   hydrOXYzine (ATARAX/VISTARIL) 25 MG tablet   DULoxetine (CYMBALTA) 60 MG capsule   Other Relevant Orders   CBC with Differential/Platelet   Comprehensive metabolic panel   Insomnia    Under good control on current regimen. Continue current regimen. Continue to monitor. Call with any concerns. Refills given for 5 months today.       Relevant Orders   CBC with Differential/Platelet   Comprehensive metabolic panel   Fibromyalgia    Under good control on current regimen. Continue current regimen. Continue to monitor. Call with any concerns. Refills given. Labs drawn today.      Relevant Medications   Acetaminophen-Codeine 300-30 MG tablet   pregabalin (LYRICA) 50 MG capsule   pregabalin (LYRICA) 100 MG capsule   meloxicam (MOBIC) 7.5 MG tablet   DULoxetine (CYMBALTA) 60 MG capsule   cyclobenzaprine (FLEXERIL) 10 MG tablet   Other Relevant Orders   CBC with Differential/Platelet   Comprehensive metabolic panel   Hyperlipidemia    Under good control on current regimen. Continue current regimen. Continue to monitor. Call with any concerns. Refills given. Labs drawn today.      Relevant Medications   metoprolol succinate (TOPROL-XL) 50 MG 24 hr tablet   amLODipine (NORVASC) 5 MG tablet   Other Relevant Orders   CBC with Differential/Platelet   Comprehensive metabolic panel   Lipid Panel w/o Chol/HDL Ratio       Follow up plan: Return in about 6 months (around 01/10/2021) for physical.

## 2020-07-11 NOTE — Assessment & Plan Note (Signed)
Under good control on current regimen. Continue current regimen. Continue to monitor. Call with any concerns. Refills given for 5 months today.

## 2020-07-12 LAB — CBC WITH DIFFERENTIAL/PLATELET
Basophils Absolute: 0.1 10*3/uL (ref 0.0–0.2)
Basos: 1 %
EOS (ABSOLUTE): 0.3 10*3/uL (ref 0.0–0.4)
Eos: 5 %
Hematocrit: 35.7 % (ref 34.0–46.6)
Hemoglobin: 11.6 g/dL (ref 11.1–15.9)
Immature Grans (Abs): 0 10*3/uL (ref 0.0–0.1)
Immature Granulocytes: 0 %
Lymphocytes Absolute: 2.4 10*3/uL (ref 0.7–3.1)
Lymphs: 37 %
MCH: 30.1 pg (ref 26.6–33.0)
MCHC: 32.5 g/dL (ref 31.5–35.7)
MCV: 93 fL (ref 79–97)
Monocytes Absolute: 0.4 10*3/uL (ref 0.1–0.9)
Monocytes: 6 %
Neutrophils Absolute: 3.4 10*3/uL (ref 1.4–7.0)
Neutrophils: 51 %
Platelets: 269 10*3/uL (ref 150–450)
RBC: 3.85 x10E6/uL (ref 3.77–5.28)
RDW: 12.4 % (ref 11.7–15.4)
WBC: 6.6 10*3/uL (ref 3.4–10.8)

## 2020-07-12 LAB — COMPREHENSIVE METABOLIC PANEL
ALT: 14 IU/L (ref 0–32)
AST: 19 IU/L (ref 0–40)
Albumin/Globulin Ratio: 1.4 (ref 1.2–2.2)
Albumin: 3.9 g/dL (ref 3.8–4.8)
Alkaline Phosphatase: 112 IU/L (ref 44–121)
BUN/Creatinine Ratio: 17 (ref 12–28)
BUN: 18 mg/dL (ref 8–27)
Bilirubin Total: 0.4 mg/dL (ref 0.0–1.2)
CO2: 23 mmol/L (ref 20–29)
Calcium: 9 mg/dL (ref 8.7–10.3)
Chloride: 101 mmol/L (ref 96–106)
Creatinine, Ser: 1.09 mg/dL — ABNORMAL HIGH (ref 0.57–1.00)
Globulin, Total: 2.7 g/dL (ref 1.5–4.5)
Glucose: 93 mg/dL (ref 65–99)
Potassium: 4.5 mmol/L (ref 3.5–5.2)
Sodium: 136 mmol/L (ref 134–144)
Total Protein: 6.6 g/dL (ref 6.0–8.5)
eGFR: 57 mL/min/{1.73_m2} — ABNORMAL LOW (ref >59)

## 2020-07-12 LAB — LIPID PANEL W/O CHOL/HDL RATIO
Cholesterol, Total: 176 mg/dL (ref 100–199)
HDL: 43 mg/dL (ref >39)
LDL Chol Calc (NIH): 109 mg/dL — ABNORMAL HIGH (ref 0–99)
Triglycerides: 136 mg/dL (ref 0–149)
VLDL Cholesterol Cal: 24 mg/dL (ref 5–40)

## 2020-07-12 LAB — TSH: TSH: 4.56 u[IU]/mL — ABNORMAL HIGH (ref 0.450–4.500)

## 2020-07-13 ENCOUNTER — Other Ambulatory Visit: Payer: Self-pay | Admitting: Obstetrics and Gynecology

## 2020-07-13 DIAGNOSIS — N951 Menopausal and female climacteric states: Secondary | ICD-10-CM

## 2020-07-18 ENCOUNTER — Other Ambulatory Visit: Payer: Self-pay | Admitting: Obstetrics and Gynecology

## 2020-07-18 ENCOUNTER — Other Ambulatory Visit: Payer: Self-pay | Admitting: Family Medicine

## 2020-07-18 DIAGNOSIS — N951 Menopausal and female climacteric states: Secondary | ICD-10-CM

## 2020-07-18 MED ORDER — LEVOTHYROXINE SODIUM 50 MCG PO TABS: ORAL_TABLET | ORAL | 1 refills | Status: DC

## 2020-07-18 NOTE — Telephone Encounter (Signed)
Scheduled 1/25

## 2020-07-18 NOTE — Telephone Encounter (Signed)
  Notes to clinic: medication was filled by a different provider  Review for continued use and refill    Requested Prescriptions  Pending Prescriptions Disp Refills   PREMPRO 0.45-1.5 MG tablet [Pharmacy Med Name: PREMPRO 0.45MG /1.5MG  TABS 28'S] 84 tablet     Sig: TAKE 1 TABLET BY MOUTH EVERY DAY      OB/GYN:  Hormone Combinations Passed - 07/18/2020  9:49 AM      Passed - Mammogram is up-to-date per Health Maintenance      Passed - Last BP in normal range    BP Readings from Last 1 Encounters:  07/11/20 116/78          Passed - Valid encounter within last 12 months    Recent Outpatient Visits           1 week ago Aortic atherosclerosis ([place])   [place], Megan P, DO   3 months ago Hypertension, unspecified type   [place], Megan P, DO   3 months ago Hypertension, unspecified type   [place], Megan P, DO   4 months ago Elevated blood pressure reading   [facility] Jon Billings, NP   6 months ago Routine general medical examination at a health care facility   [facility], Barb Merino, DO       Future Appointments             In 2 months  [place], [place]   In 5 months Wynetta Emery, Barb Merino, DO [employer]

## 2020-07-18 NOTE — Telephone Encounter (Signed)
Called pt to inform her that she would need to contact her ob doctor for refill no answer left vm

## 2020-07-25 NOTE — Telephone Encounter (Signed)
Called pt no answer left vm 

## 2020-08-15 ENCOUNTER — Encounter: Payer: Self-pay | Admitting: Family Medicine

## 2020-08-15 ENCOUNTER — Other Ambulatory Visit: Payer: Self-pay | Admitting: Family Medicine

## 2020-08-15 DIAGNOSIS — Z1231 Encounter for screening mammogram for malignant neoplasm of breast: Secondary | ICD-10-CM

## 2020-08-18 ENCOUNTER — Other Ambulatory Visit: Payer: Self-pay | Admitting: *Deleted

## 2020-08-18 DIAGNOSIS — F17200 Nicotine dependence, unspecified, uncomplicated: Secondary | ICD-10-CM

## 2020-08-18 DIAGNOSIS — Z87891 Personal history of nicotine dependence: Secondary | ICD-10-CM

## 2020-08-18 NOTE — Progress Notes (Signed)
Contacted and scheduled for annual lung screening scan. Patient is a current smoker with a 48 pack year history.

## 2020-09-02 DIAGNOSIS — M7918 Myalgia, other site: Secondary | ICD-10-CM | POA: Diagnosis not present

## 2020-09-02 DIAGNOSIS — M797 Fibromyalgia: Secondary | ICD-10-CM | POA: Diagnosis not present

## 2020-09-02 DIAGNOSIS — M722 Plantar fascial fibromatosis: Secondary | ICD-10-CM | POA: Diagnosis not present

## 2020-09-09 NOTE — Progress Notes (Deleted)
[facility] Return Visit  SUBJECTIVE  History of Present Illness: Diane Pearson is a 65 y.o. female seen in follow-up for prolapse. Plan at last visit was ***.     Past Medical History: Patient  has a past medical history of Arthritis, CAD (coronary artery disease), Depression, Depression, Family history of breast cancer, Fibromyalgia, Hypothyroidism, Insomnia, Menopause, Osteoporosis, and Tremors of nervous system.   Past Surgical History: She  has a past surgical history that includes varicose veins (1998); Colonoscopy with propofol (N/A, 09/29/2018); and Breast biopsy (Left, 04/20/2016).   Medications: She has a current medication list which includes the following prescription(s): acetaminophen-codeine, amlodipine, finacea, calcium carbonate, cholecalciferol, conjugated estrogens, cyclobenzaprine, duloxetine, prempro, famotidine, hydroxyzine, ivermectin, levothyroxine, lidocaine, magnesium, meloxicam, metoprolol succinate, mometasone, multiple vitamins-minerals, potassium, pregabalin, pregabalin, vitamin b-12, vitamin e, xiidra, and zolpidem.   Allergies: Patient is allergic to bactrim [sulfamethoxazole-trimethoprim], erythromycin, gabapentin, tamiflu [oseltamivir phosphate], tramadol, nabumetone, and nitrofurantoin.   Social History: Patient  reports that she has been smoking cigarettes. She has a 45.00 pack-year smoking history. She has never used smokeless tobacco. She reports that she does not drink alcohol and does not use drugs.      OBJECTIVE     Physical Exam: There were no vitals filed for this visit. Gen: No apparent distress, A&O x 3.  Detailed Urogynecologic Evaluation:  Deferred. Prior exam showed:  No flowsheet data found.     ASSESSMENT AND PLAN    Diane Pearson is a 65 y.o. with:  No diagnosis found.

## 2020-09-10 ENCOUNTER — Ambulatory Visit: Payer: Medicare Other | Admitting: Obstetrics and Gynecology

## 2020-09-12 ENCOUNTER — Ambulatory Visit (INDEPENDENT_AMBULATORY_CARE_PROVIDER_SITE_OTHER): Payer: Medicare Other | Admitting: Obstetrics and Gynecology

## 2020-09-12 ENCOUNTER — Encounter: Payer: Self-pay | Admitting: Obstetrics and Gynecology

## 2020-09-12 ENCOUNTER — Other Ambulatory Visit (HOSPITAL_COMMUNITY)
Admission: RE | Admit: 2020-09-12 | Discharge: 2020-09-12 | Disposition: A | Discharge status: Home / Self Care | Payer: Medicare Other | Source: Ambulatory Visit | Attending: Obstetrics and Gynecology | Admitting: Obstetrics and Gynecology

## 2020-09-12 ENCOUNTER — Other Ambulatory Visit: Payer: Self-pay

## 2020-09-12 VITALS — BP 158/93 | HR 80 | Wt 169.0 lb

## 2020-09-12 DIAGNOSIS — B9689 Other specified bacterial agents as the cause of diseases classified elsewhere: Secondary | ICD-10-CM | POA: Diagnosis not present

## 2020-09-12 DIAGNOSIS — N393 Stress incontinence (female) (male): Secondary | ICD-10-CM | POA: Diagnosis not present

## 2020-09-12 DIAGNOSIS — N898 Other specified noninflammatory disorders of vagina: Secondary | ICD-10-CM

## 2020-09-12 DIAGNOSIS — N8110 Cystocele, unspecified: Secondary | ICD-10-CM | POA: Diagnosis not present

## 2020-09-12 DIAGNOSIS — R198 Other specified symptoms and signs involving the digestive system and abdomen: Secondary | ICD-10-CM | POA: Diagnosis not present

## 2020-09-12 DIAGNOSIS — R350 Frequency of micturition: Secondary | ICD-10-CM | POA: Diagnosis not present

## 2020-09-12 DIAGNOSIS — N760 Acute vaginitis: Secondary | ICD-10-CM

## 2020-09-12 MED ORDER — PHENAZOPYRIDINE HCL 95 MG PO TABS: 95.0000 mg | ORAL_TABLET | Freq: Once | ORAL | 1 refills | Status: AC

## 2020-09-12 NOTE — Progress Notes (Signed)
[facility] Return Visit  SUBJECTIVE  History of Present Illness: Diane Pearson is a 65 y.o. female seen in follow-up for prolapse and incontinence.   Still has some slight leakage, small amounts. Wears mini pads and always feels moist. Unsure if she is leaking or if she has sweating, or if she has a vaginal infection. Stopped drinking mountain dew, still drinks tea.   Still feels she has constipation and difficulty having bowel movements.   Past Medical History: Patient  has a past medical history of Arthritis, CAD (coronary artery disease), Depression, Depression, Family history of breast cancer, Fibromyalgia, Hypothyroidism, Insomnia, Menopause, Osteoporosis, and Tremors of nervous system.   Past Surgical History: She  has a past surgical history that includes varicose veins (1998); Colonoscopy with propofol (N/A, 09/29/2018); and Breast biopsy (Left, 04/20/2016).   Medications: She has a current medication list which includes the following prescription(s): acetaminophen-codeine, amlodipine, finacea, calcium carbonate, cholecalciferol, conjugated estrogens, cyclobenzaprine, duloxetine, famotidine, ivermectin, lidocaine, magnesium, meloxicam, metoprolol succinate, mometasone, multiple vitamins-minerals, phenazopyridine, potassium, pregabalin, pregabalin, vitamin b-12, vitamin e, xiidra, zolpidem, prempro, hydroxyzine, and levothyroxine.   Allergies: Patient is allergic to bactrim [sulfamethoxazole-trimethoprim], erythromycin, gabapentin, tamiflu [oseltamivir phosphate], tramadol, nabumetone, and nitrofurantoin.   Social History: Patient  reports that she has been smoking cigarettes. She has a 45.00 pack-year smoking history. She has never used smokeless tobacco. She reports that she does not drink alcohol and does not use drugs.      OBJECTIVE     Physical Exam: Vitals:   09/12/20 1307  BP: (!) 158/93  Pulse: 80  Weight: 169 lb (76.7 kg)   Gen: No apparent  distress, A&O x 3.  Detailed Urogynecologic Evaluation:  Normal external genitalia.   Speculum revealed normal vaginal mucosa with atrophy, scant discharge present. Aptima swab obtained.    POP-Q  1.5                                            Aa   1.5                                           Ba  -6                                              C   4.5                                            Gh  3                                            Pb  8                                            tvl   -2  Ap  -2                                            Bp  -6                                              D      ASSESSMENT AND PLAN    Diane Pearson is a 65 y.o. with:  1. Stress incontinence of urine   2. Rectosphincteric dyssynergia   3. Prolapse of anterior vaginal wall   4. Urinary frequency   5. Vaginal discharge    SUI - For treatment of stress urinary incontinence,  non-surgical options include expectant management, weight loss, physical therapy, as well as a pessary.  Surgical options include a midurethral sling, Burch urethropexy, and transurethral injection of a bulking agent. - She is interested in physical therapy, referral placed.   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 Still continues to have issued with constipation. Referral to pelvic PT placed.   3. Vaginal discharge -aptima swab obtained - also will do pyridium pad test to r/o leakage. She is to take pyridium and wear a pad. If pad turns orange, then moistness is urine.   4. POP - she does not feel it is bothersome and prefers expectant management. Reassurance provided.   Follow up after PT if needed  Jaquita Folds, MD  Time spent: I spent 25 minutes dedicated to the care of this patient on the date of this encounter to include pre-visit review of records, face-to-face time with the patient and post visit documentation.

## 2020-09-12 NOTE — Patient Instructions (Signed)
Pad test: Take the pyridium and wear a pad. If the pad turns orange, then this is leakage of urine.

## 2020-09-16 LAB — CERVICOVAGINAL ANCILLARY ONLY
Bacterial Vaginitis (gardnerella): POSITIVE — AB
Candida Glabrata: NEGATIVE
Candida Vaginitis: NEGATIVE
Comment: NEGATIVE
Comment: NEGATIVE
Comment: NEGATIVE

## 2020-09-16 MED ORDER — METRONIDAZOLE 500 MG PO TABS: 500.0000 mg | ORAL_TABLET | Freq: Two times a day (BID) | ORAL | 0 refills | Status: AC

## 2020-09-16 NOTE — Addendum Note (Signed)
Addended by: Jaquita Folds on: 09/16/2020 01:49 PM   Modules accepted: Orders

## 2020-09-17 ENCOUNTER — Telehealth: Payer: Self-pay

## 2020-09-17 NOTE — Telephone Encounter (Signed)
Diane Pearson is a 65 y.o. female was contacted.  Pt verified using 2 identifiers. Confirmation that I am speaking with the correct person. Pt said she saw her results on my-chart and picked up her medication yesterday.

## 2020-09-17 NOTE — Telephone Encounter (Signed)
-----   Message from Jaquita Folds, MD sent at 09/16/2020  1:49 PM EDT ----- Please let patient know that the vaginal swab was positive for bacterial vaginosis and that I have sent an antimitotic to the pharmacy for her to take for 7 days.

## 2020-09-18 ENCOUNTER — Ambulatory Visit (HOSPITAL_COMMUNITY)
Admission: RE | Admit: 2020-09-18 | Discharge: 2020-09-18 | Disposition: A | Discharge status: Home / Self Care | Payer: Self-pay | Source: Ambulatory Visit

## 2020-09-18 ENCOUNTER — Other Ambulatory Visit: Payer: Self-pay

## 2020-09-18 ENCOUNTER — Ambulatory Visit
Admission: RE | Admit: 2020-09-18 | Discharge: 2020-09-18 | Disposition: A | Discharge status: Home / Self Care | Payer: Medicare Other | Source: Ambulatory Visit | Attending: Family Medicine | Admitting: Family Medicine

## 2020-09-18 DIAGNOSIS — Z1231 Encounter for screening mammogram for malignant neoplasm of breast: Secondary | ICD-10-CM | POA: Insufficient documentation

## 2020-09-19 ENCOUNTER — Ambulatory Visit (INDEPENDENT_AMBULATORY_CARE_PROVIDER_SITE_OTHER): Payer: Medicare Other | Admitting: Podiatry

## 2020-09-19 ENCOUNTER — Ambulatory Visit (INDEPENDENT_AMBULATORY_CARE_PROVIDER_SITE_OTHER): Payer: Medicare Other

## 2020-09-19 DIAGNOSIS — B351 Tinea unguium: Secondary | ICD-10-CM

## 2020-09-19 DIAGNOSIS — M2041 Other hammer toe(s) (acquired), right foot: Secondary | ICD-10-CM

## 2020-09-19 DIAGNOSIS — M79674 Pain in right toe(s): Secondary | ICD-10-CM | POA: Diagnosis not present

## 2020-09-19 DIAGNOSIS — M79675 Pain in left toe(s): Secondary | ICD-10-CM

## 2020-09-19 NOTE — Progress Notes (Signed)
   HPI: 65 y.o. female presenting today for new complaint today regarding a symptomatic hammertoe to the right second toe.  She was last seen in the office in 2020 where we lightly discussed the hammertoe and she said at that time it was not symptomatic.  Over the last few years it is slowly increased in pain and tenderness.  Aggravated by walking despite change in shoe gear.  She states that the deformity has slowly increased over time.  Patient also complains of thickened discolored fungal toenails.  She would like to have them addressed as well  Past Medical History:  Diagnosis Date   Arthritis    CAD (coronary artery disease)    Diagnosed on CT scan   Depression    Depression    Family history of breast cancer    Fibromyalgia    Hypothyroidism    Insomnia    Menopause    Osteoporosis    Tremors of nervous system       Objective: Physical Exam General: The patient is alert and oriented x3 in no acute distress.  Dermatology: Skin is cool, dry and supple bilateral lower extremities. Negative for open lesions or macerations.  Hyperkeratotic dystrophic discolored nails noted 1-5 bilateral  Vascular: Palpable pedal pulses bilaterally. No edema or erythema noted. Capillary refill within normal limits.  Neurological: Epicritic and protective threshold grossly intact bilaterally.   Musculoskeletal Exam: All pedal and ankle joints range of motion within normal limits bilateral. Muscle strength 5/5 in all groups bilateral. Hammertoe contracture deformity noted to digits second digit of the right foot.  Radiographic Exam: Hammertoe contracture deformity noted to the interphalangeal joints and MPJ of the respective hammertoe digits mentioned on clinical musculoskeletal exam.     Assessment: 1.  Hammertoe right second toe 2.  Onychomycosis of toenails 1-5 bilateral   Plan of Care:  1. Patient evaluated. X-Rays reviewed.  2. Today we discussed the conservative versus surgical  management of the presenting pathology. The patient opts for surgical management. All possible complications and details of the procedure were explained. All patient questions were answered. No guarantees were expressed or implied. 3. Authorization for surgery was initiated today. Surgery will consist of DIPJ arthroplasty with percutaneous pin fixation right second digit 4.  Tolcylen antifungal topical was provided for the patient today to apply to the nails daily.  She has tried oral Lamisil in the past with no improvement 5.  Return to clinic 1 week postop  *Going to a high school reunion in [place] Sept 2022     Edrick Kins, DPM [facility]  Dr. Edrick Kins, DPM    [address], [place] [address]                Office [phone]  Fax [phone]

## 2020-09-24 ENCOUNTER — Ambulatory Visit
Admission: RE | Admit: 2020-09-24 | Discharge: 2020-09-24 | Disposition: A | Discharge status: Home / Self Care | Payer: Medicare Other | Source: Ambulatory Visit | Attending: Acute Care | Admitting: Acute Care

## 2020-09-24 ENCOUNTER — Other Ambulatory Visit: Payer: Self-pay

## 2020-09-24 DIAGNOSIS — F17200 Nicotine dependence, unspecified, uncomplicated: Secondary | ICD-10-CM | POA: Insufficient documentation

## 2020-09-24 DIAGNOSIS — Z87891 Personal history of nicotine dependence: Secondary | ICD-10-CM | POA: Diagnosis not present

## 2020-09-24 DIAGNOSIS — F17210 Nicotine dependence, cigarettes, uncomplicated: Secondary | ICD-10-CM | POA: Diagnosis not present

## 2020-10-04 ENCOUNTER — Other Ambulatory Visit: Payer: Self-pay | Admitting: Family Medicine

## 2020-10-04 NOTE — Telephone Encounter (Signed)
pharmacist stated to dc this request they sent by error

## 2020-10-07 ENCOUNTER — Other Ambulatory Visit: Payer: Self-pay | Admitting: Family Medicine

## 2020-10-07 NOTE — Telephone Encounter (Signed)
Too soon last RF 07/11/20 #90 1 RF

## 2020-10-08 ENCOUNTER — Ambulatory Visit: Payer: Medicare Other

## 2020-10-09 NOTE — Progress Notes (Signed)
Please call patient and let them  know their  low dose Ct was read as a Lung RADS 2: nodules that are benign in appearance and behavior with a very low likelihood of becoming a clinically active cancer due to size or lack of growth. Recommendation per radiology is for a repeat LDCT in 12 months. .Please let them  know we will order and schedule their  annual screening scan for 09/2021. Please let them  know there was notation of CAD on their  scan.  Please remind the patient  that this is a non-gated exam therefore degree or severity of disease  cannot be determined. Please have them  follow up with their PCP regarding potential risk factor modification, dietary therapy or pharmacologic therapy if clinically indicated. Pt.  is not  currently on statin therapy. Please place order for annual  screening scan for  09/2021 and fax results to PCP. Thanks so much.  + CAD, age advanced, not on statin. No cards notes. Please have her follow up with PCP. Thanks

## 2020-10-10 ENCOUNTER — Encounter: Payer: Self-pay | Admitting: *Deleted

## 2020-10-10 DIAGNOSIS — F17210 Nicotine dependence, cigarettes, uncomplicated: Secondary | ICD-10-CM

## 2020-10-22 ENCOUNTER — Telehealth: Payer: Self-pay | Admitting: Urology

## 2020-10-22 NOTE — Telephone Encounter (Signed)
DOS - 11/13/20  HAMMERTOE REPAIR 2ND RIGHT --- [id number]  [employer] EFFECTIVE DATE - 02/09/20   PLAN DEDUCTIBLE - $0.00 OUT OF POCKET -  $7,550.00 W/ $7,541.33 REMAINING COINSURANCE - 20% COPAY - $0.00   SPOKE WITH BERNIE G. WITH [employer] AND SHE STATED THAT FOR CPT CODE 29937 NO PRIOR AUTH IS REQUIRED.   REF # [id number]

## 2020-10-27 ENCOUNTER — Ambulatory Visit (INDEPENDENT_AMBULATORY_CARE_PROVIDER_SITE_OTHER): Payer: Medicare Other

## 2020-10-27 VITALS — Ht 66.0 in | Wt 165.0 lb

## 2020-10-27 DIAGNOSIS — Z0000 Encounter for general adult medical examination without abnormal findings: Secondary | ICD-10-CM | POA: Diagnosis not present

## 2020-10-27 NOTE — Progress Notes (Signed)
I connected with Diane Pearson today by telephone and verified that I am speaking with the correct person using two identifiers. Location patient: home Location provider: work Persons participating in the virtual visit: Fatina Sprankle, Glenna Durand LPN.   I discussed the limitations, risks, security and privacy concerns of performing an evaluation and management service by telephone and the availability of in person appointments. I also discussed with the patient that there may be a patient responsible charge related to this service. The patient expressed understanding and verbally consented to this telephonic visit.    Interactive audio and video telecommunications were attempted between this provider and patient, however failed, due to patient having technical difficulties OR patient did not have access to video capability.  We continued and completed visit with audio only.     Vital signs may be patient reported or missing.  Subjective:   Diane Pearson is a 65 y.o. female who presents for Medicare Annual (Subsequent) preventive examination.  Review of Systems     Cardiac Risk Factors include: dyslipidemia;hypertension;smoking/ tobacco exposure     Objective:    Today's Vitals   10/27/20 1511  Weight: 165 lb (74.8 kg)  Height: 5\' 6"  (1.676 m)   Body mass index is 26.63 kg/m.  Advanced Directives 10/27/2020 10/08/2019 09/29/2018 09/27/2018 11/29/2017  Does Patient Have a Medical Advance Directive? No No No No No  Would patient like information on creating a medical advance directive? - - No - Patient declined - No - Patient declined    Current Medications (verified) Outpatient Encounter Medications as of 10/27/2020  Medication Sig   Acetaminophen-Codeine 300-30 MG tablet Take 1 tablet by mouth daily as needed.   amLODipine (NORVASC) 5 MG tablet Take 1 tablet (5 mg total) by mouth daily.   calcium carbonate (OS-CAL - DOSED IN MG OF ELEMENTAL CALCIUM) 1250 (500 Ca) MG tablet Take  1 tablet by mouth.   cholecalciferol (VITAMIN D) 1000 UNITS tablet Take 1,000 Units by mouth daily.   cyclobenzaprine (FLEXERIL) 10 MG tablet TAKE ONE TABLET BY MOUTH THREE TIMES A DAY FOR SPASMS   DULoxetine (CYMBALTA) 60 MG capsule Take 1 capsule (60 mg total) by mouth daily.   famotidine (PEPCID) 10 MG tablet Take 10 mg by mouth at bedtime.   levothyroxine (SYNTHROID) 50 MCG tablet TAKE 1 TABLET BY MOUTH EVERY MORNING 30 MINUTES BEFORE BREAKFAST   Magnesium 400 MG TABS Take 400 mg by mouth daily.   meloxicam (MOBIC) 7.5 MG tablet Take one/day; max take 2/day if necessary   metoprolol succinate (TOPROL-XL) 50 MG 24 hr tablet TAKE ONE TABLET BY MOUTH DAILY WITH OR IMMEDIATELY FOLLOWING A MEAL   Multiple Vitamins-Minerals (CENTRUM SILVER ADULT 50+ PO) Take 1 tablet by mouth daily.   Potassium 99 MG TABS Take 99 mg by mouth daily.   pregabalin (LYRICA) 100 MG capsule Take 1 capsule (100 mg total) by mouth 2 (two) times daily. To take an additional 50mg  in the PM   pregabalin (LYRICA) 50 MG capsule Take 1 capsule (50 mg total) by mouth at bedtime. With the 100mg    vitamin B-12 (CYANOCOBALAMIN) 500 MCG tablet Take 500 mcg by mouth daily.   VITAMIN E PO Take by mouth daily.   XIIDRA 5 % SOLN    zolpidem (AMBIEN) 10 MG tablet Take 1 tablet (10 mg total) by mouth at bedtime.   Azelaic Acid (FINACEA) 15 % FOAM Apply to mid face 1-2 times daily. (Patient not taking: Reported on 10/27/2020)   conjugated  estrogens (PREMARIN) vaginal cream Place 1 Applicatorful vaginally every other day. (Patient not taking: Reported on 10/27/2020)   estrogen, conjugated,-medroxyprogesterone (PREMPRO) 0.45-1.5 MG tablet Take 1 tablet by mouth daily. (Patient not taking: No sig reported)   hydrOXYzine (ATARAX/VISTARIL) 25 MG tablet Take 1 tablet (25 mg total) by mouth daily. (Patient not taking: No sig reported)   Ivermectin (SOOLANTRA) 1 % CREA Apply to affected areas on face at night daily. (Patient not taking: Reported on  10/27/2020)   lidocaine (XYLOCAINE) 5 % ointment Apply 1 application topically 3 (three) times daily as needed.    mometasone (ELOCON) 0.1 % cream Apply to affected areas as directed 1-2 times per day as needed. (Patient not taking: Reported on 10/27/2020)   No facility-administered encounter medications on file as of 10/27/2020.    Allergies (verified) Bactrim [sulfamethoxazole-trimethoprim], Erythromycin, Gabapentin, Tamiflu [oseltamivir phosphate], Tramadol, Nabumetone, and Nitrofurantoin   History: Past Medical History:  Diagnosis Date   Arthritis    CAD (coronary artery disease)    Diagnosed on CT scan   Depression    Depression    Family history of breast cancer    Fibromyalgia    Hypothyroidism    Insomnia    Menopause    Osteoporosis    Tremors of nervous system    Past Surgical History:  Procedure Laterality Date   BREAST BIOPSY Left 04/20/2016   COLUMNAR CELL CHANGE   COLONOSCOPY WITH PROPOFOL N/A 09/29/2018   Procedure: COLONOSCOPY WITH PROPOFOL;  Surgeon: [name], MD;  Location: [facility] ENDOSCOPY;  Service: Endoscopy;  Laterality: N/A;   varicose veins  1998   Family History  Problem Relation Age of Onset   Cancer Father        Lung   Tuberculosis Father    Breast cancer Maternal Grandmother 65   Ulcerative colitis Sister    Anxiety disorder Daughter    Alzheimer's disease Maternal Grandfather    Heart disease Paternal Grandmother        MI   Heart disease Paternal Grandfather        MI   Breast cancer Maternal Aunt        70's   Social History   Socioeconomic History   Marital status: Divorced    Spouse name: Not on file   Number of children: 1   Years of education: Not on file   Highest education level: 12th grade  Occupational History   Occupation: disability   Tobacco Use   Smoking status: Every Day    Packs/day: 1.00    Years: 45.00    Pack years: 45.00    Types: Cigarettes   Smokeless tobacco: Never  Vaping Use   Vaping Use:  Never used  Substance and Sexual Activity   Alcohol use: No    Alcohol/week: 0.0 standard drinks   Drug use: No   Sexual activity: Not Currently  Other Topics Concern   Not on file  Social History Narrative   Not on file   Social Determinants of Health   Financial Resource Strain: Low Risk    Difficulty of Paying Living Expenses: Not hard at all  Food Insecurity: No Food Insecurity   Worried About Charity fundraiser in the Last Year: Never true   Ran Out of Food in the Last Year: Never true  Transportation Needs: No Transportation Needs   Lack of Transportation (Medical): No   Lack of Transportation (Non-Medical): No  Physical Activity: Insufficiently Active   Days of Exercise per  Week: 3 days   Minutes of Exercise per Session: 30 min  Stress: No Stress Concern Present   Feeling of Stress : Not at all  Social Connections: Not on file    Tobacco Counseling Ready to quit: No Counseling given: Not Answered   Clinical Intake:  Pre-visit preparation completed: Yes  Pain : No/denies pain     Nutritional Status: BMI 25 -29 Overweight Nutritional Risks: None Diabetes: No  How often do you need to have someone help you when you read instructions, pamphlets, or other written materials from your doctor or pharmacy?: 1 - Never What is the last grade level you completed in school?: 12th grade  Diabetic? no  Interpreter Needed?: No  Information entered by :: NAllen LPN   Activities of Daily Living In your present state of health, do you have any difficulty performing the following activities: 10/27/2020  Hearing? N  Vision? Y  Comment has dry eyes  Difficulty concentrating or making decisions? Y  Walking or climbing stairs? N  Dressing or bathing? N  Doing errands, shopping? N  Preparing Food and eating ? N  Using the Toilet? N  In the past six months, have you accidently leaked urine? Y  Comment wears a pantyliner  Do you have problems with loss of bowel  control? N  Managing your Medications? N  Managing your Finances? N  Housekeeping or managing your Housekeeping? N  Some recent data might be hidden    Patient Care Team: Valerie Roys, DO as PCP - General (Family Medicine) Rico Junker, RN as Registered Nurse Theodore Demark, RN as Registered Nurse  Indicate any recent Medical Services you may have received from other than [facility] providers in the past year (date may be approximate).     Assessment:   This is a routine wellness examination for Diane Pearson.  Hearing/Vision screen No results found.  Dietary issues and exercise activities discussed: Current Exercise Habits: Home exercise routine, Type of exercise: Other - see comments (stationary bike), Time (Minutes): 30, Frequency (Times/Week): 3, Weekly Exercise (Minutes/Week): 90   Goals Addressed             This Visit's Progress    Patient Stated       10/27/2020, stop smoking in the future       Depression Screen PHQ 2/9 Scores 10/27/2020 07/11/2020 04/10/2020 10/08/2019 05/30/2019 01/09/2019 10/23/2018  PHQ - 2 Score 0 0 0 2 2 1 4   PHQ- 9 Score - 0 2 8 5  - 11  Exception Documentation - - - - - - -  Not completed - - - - - - -    Fall Risk Fall Risk  10/27/2020 07/11/2020 10/08/2019 10/03/2018 09/27/2018  Falls in the past year? 1 0 1 1 0  Comment tripped, lost balance, missed step - trip over feet - -  Number falls in past yr: 1 0 0 1 -  Injury with Fall? 0 0 0 0 -  Risk for fall due to : Medication side effect;Impaired balance/gait No Fall Risks Impaired balance/gait;Medication side effect History of fall(s) -  Follow up Falls evaluation completed;Education provided;Falls prevention discussed Falls evaluation completed Falls evaluation completed;Education provided;Falls prevention discussed - -    FALL RISK PREVENTION PERTAINING TO THE HOME:  Any stairs in or around the home? Yes  If so, are there any without handrails? No  Home free of loose throw rugs in walkways, pet  beds, electrical cords, etc? Yes  Adequate lighting in  your home to reduce risk of falls? Yes   ASSISTIVE DEVICES UTILIZED TO PREVENT FALLS:  Life alert? No  Use of a cane, walker or w/c? No  Grab bars in the bathroom? Yes  Shower chair or bench in shower? No  Elevated toilet seat or a handicapped toilet? No   TIMED UP AND GO:  Was the test performed? No .       Cognitive Function:     6CIT Screen 10/27/2020 10/08/2019  What Year? 0 points 0 points  What month? 0 points 0 points  What time? 0 points 0 points  Count back from 20 4 points 0 points  Months in reverse 0 points 0 points  Repeat phrase 4 points 0 points  Total Score 8 0    Immunizations Immunization History  Administered Date(s) Administered   PFIZER(Purple Top)SARS-COV-2 Vaccination 05/10/2019, 06/06/2019   Pneumococcal Polysaccharide-23 01/14/2014   Td 02/08/2009, 01/11/2020    TDAP status: Up to date  Flu Vaccine status: Declined, Education has been provided regarding the importance of this vaccine but patient still declined. Advised may receive this vaccine at local pharmacy or Health Dept. Aware to provide a copy of the vaccination record if obtained from local pharmacy or Health Dept. Verbalized acceptance and understanding.  Pneumococcal vaccine status: Up to date  Covid-19 vaccine status: Completed vaccines  Qualifies for Shingles Vaccine? Yes   Zostavax completed No   Shingrix Completed?: No.    Education has been provided regarding the importance of this vaccine. Patient has been advised to call insurance company to determine out of pocket expense if they have not yet received this vaccine. Advised may also receive vaccine at local pharmacy or Health Dept. Verbalized acceptance and understanding.  Screening Tests Health Maintenance  Topic Date Due   Zoster Vaccines- Shingrix (1 of 2) Never done   COVID-19 Vaccine (3 - Pfizer risk series) 07/04/2019   INFLUENZA VACCINE  Never done   MAMMOGRAM   09/19/2022   PAP SMEAR-Modifier  12/16/2022   COLONOSCOPY (Pts 45-39yrs Insurance coverage will need to be confirmed)  09/29/2023   TETANUS/TDAP  01/10/2030   Hepatitis C Screening  Completed   HIV Screening  Completed   HPV VACCINES  Aged Out    Health Maintenance  Health Maintenance Due  Topic Date Due   Zoster Vaccines- Shingrix (1 of 2) Never done   COVID-19 Vaccine (3 - Pfizer risk series) 07/04/2019   INFLUENZA VACCINE  Never done    Colorectal cancer screening: Type of screening: Colonoscopy. Completed 09/29/2018. Repeat every 5 years  Mammogram status: Completed 09/18/2020. Repeat every year  Bone Density status: n/a  Lung Cancer Screening: (Low Dose CT Chest recommended if Age 32-80 years, 30 pack-year currently smoking OR have quit w/in 15years.) does qualify.   Lung Cancer Screening Referral: CT scan 09/24/2020  Additional Screening:  Hepatitis C Screening: does qualify; Completed 09/25/2014  Vision Screening: Recommended annual ophthalmology exams for early detection of glaucoma and other disorders of the eye. Is the patient up to date with their annual eye exam?  Yes  Who is the provider or what is the name of the office in which the patient attends annual eye exams? [facility] If pt is not established with a provider, would they like to be referred to a provider to establish care? No .   Dental Screening: Recommended annual dental exams for proper oral hygiene  Community Resource Referral / Chronic Care Management: CRR required this visit?  No  CCM required this visit?  No      Plan:     I have personally reviewed and noted the following in the patient's chart:   Medical and social history Use of alcohol, tobacco or illicit drugs  Current medications and supplements including opioid prescriptions.  Functional ability and status Nutritional status Physical activity Advanced directives List of other physicians Hospitalizations, surgeries, and ER  visits in previous 12 months Vitals Screenings to include cognitive, depression, and falls Referrals and appointments  In addition, I have reviewed and discussed with patient certain preventive protocols, quality metrics, and best practice recommendations. A written personalized care plan for preventive services as well as general preventive health recommendations were provided to patient.     Kellie Simmering, LPN   7/97/2820   Nurse Notes:

## 2020-10-27 NOTE — Patient Instructions (Signed)
Diane Pearson , Thank you for taking time to come for your Medicare Wellness Visit. I appreciate your ongoing commitment to your health goals. Please review the following plan we discussed and let me know if I can assist you in the future.   Screening recommendations/referrals: Colonoscopy: completed 09/29/2018, due 09/29/2023 Mammogram: completed 09/18/2020 Bone Density: n/a Recommended yearly ophthalmology/optometry visit for glaucoma screening and checkup Recommended yearly dental visit for hygiene and checkup  Vaccinations: Influenza vaccine: decline Pneumococcal vaccine: completed 01/14/2014 Tdap vaccine: completed 01/11/2020, due 01/10/2030 Shingles vaccine: completed   Covid-19:  06/06/2019, 05/10/2019  Advanced directives: Advance directive discussed with you today.   Conditions/risks identified: smoking  Next appointment: Follow up in one year for your annual wellness visit.   Preventive Care 40-64 Years, Female Preventive care refers to lifestyle choices and visits with your health care provider that can promote health and wellness. What does preventive care include? A yearly physical exam. This is also called an annual well check. Dental exams once or twice a year. Routine eye exams. Ask your health care provider how often you should have your eyes checked. Personal lifestyle choices, including: Daily care of your teeth and gums. Regular physical activity. Eating a healthy diet. Avoiding tobacco and drug use. Limiting alcohol use. Practicing safe sex. Taking low-dose aspirin daily starting at age 97. Taking vitamin and mineral supplements as recommended by your health care provider. What happens during an annual well check? The services and screenings done by your health care provider during your annual well check will depend on your age, overall health, lifestyle risk factors, and family history of disease. Counseling  Your health care provider may ask you questions about  your: Alcohol use. Tobacco use. Drug use. Emotional well-being. Home and relationship well-being. Sexual activity. Eating habits. Work and work Statistician. Method of birth control. Menstrual cycle. Pregnancy history. Screening  You may have the following tests or measurements: Height, weight, and BMI. Blood pressure. Lipid and cholesterol levels. These may be checked every 5 years, or more frequently if you are over 79 years old. Skin check. Lung cancer screening. You may have this screening every year starting at age 61 if you have a 30-pack-year history of smoking and currently smoke or have quit within the past 15 years. Fecal occult blood test (FOBT) of the stool. You may have this test every year starting at age 14. Flexible sigmoidoscopy or colonoscopy. You may have a sigmoidoscopy every 5 years or a colonoscopy every 10 years starting at age 62. Hepatitis C blood test. Hepatitis B blood test. Sexually transmitted disease (STD) testing. Diabetes screening. This is done by checking your blood sugar (glucose) after you have not eaten for a while (fasting). You may have this done every 1-3 years. Mammogram. This may be done every 1-2 years. Talk to your health care provider about when you should start having regular mammograms. This may depend on whether you have a family history of breast cancer. BRCA-related cancer screening. This may be done if you have a family history of breast, ovarian, tubal, or peritoneal cancers. Pelvic exam and Pap test. This may be done every 3 years starting at age 2. Starting at age 67, this may be done every 5 years if you have a Pap test in combination with an HPV test. Bone density scan. This is done to screen for osteoporosis. You may have this scan if you are at high risk for osteoporosis. Discuss your test results, treatment options, and if necessary, the  need for more tests with your health care provider. Vaccines  Your health care provider may  recommend certain vaccines, such as: Influenza vaccine. This is recommended every year. Tetanus, diphtheria, and acellular pertussis (Tdap, Td) vaccine. You may need a Td booster every 10 years. Zoster vaccine. You may need this after age 47. Pneumococcal 13-valent conjugate (PCV13) vaccine. You may need this if you have certain conditions and were not previously vaccinated. Pneumococcal polysaccharide (PPSV23) vaccine. You may need one or two doses if you smoke cigarettes or if you have certain conditions. Talk to your health care provider about which screenings and vaccines you need and how often you need them. This information is not intended to replace advice given to you by your health care provider. Make sure you discuss any questions you have with your health care provider. Document Released: 02/21/2015 Document Revised: 10/15/2015 Document Reviewed: 11/26/2014 Elsevier Interactive Patient Education  2017 [place] Prevention in the Home Falls can cause injuries. They can happen to people of all ages. There are many things you can do to make your home safe and to help prevent falls. What can I do on the outside of my home? Regularly fix the edges of walkways and driveways and fix any cracks. Remove anything that might make you trip as you walk through a door, such as a raised step or threshold. Trim any bushes or trees on the path to your home. Use bright outdoor lighting. Clear any walking paths of anything that might make someone trip, such as rocks or tools. Regularly check to see if handrails are loose or broken. Make sure that both sides of any steps have handrails. Any raised decks and porches should have guardrails on the edges. Have any leaves, snow, or ice cleared regularly. Use sand or salt on walking paths during winter. Clean up any spills in your garage right away. This includes oil or grease spills. What can I do in the bathroom? Use night lights. Install  grab bars by the toilet and in the tub and shower. Do not use towel bars as grab bars. Use non-skid mats or decals in the tub or shower. If you need to sit down in the shower, use a plastic, non-slip stool. Keep the floor dry. Clean up any water that spills on the floor as soon as it happens. Remove soap buildup in the tub or shower regularly. Attach bath mats securely with double-sided non-slip rug tape. Do not have throw rugs and other things on the floor that can make you trip. What can I do in the bedroom? Use night lights. Make sure that you have a light by your bed that is easy to reach. Do not use any sheets or blankets that are too big for your bed. They should not hang down onto the floor. Have a firm chair that has side arms. You can use this for support while you get dressed. Do not have throw rugs and other things on the floor that can make you trip. What can I do in the kitchen? Clean up any spills right away. Avoid walking on wet floors. Keep items that you use a lot in easy-to-reach places. If you need to reach something above you, use a strong step stool that has a grab bar. Keep electrical cords out of the way. Do not use floor polish or wax that makes floors slippery. If you must use wax, use non-skid floor wax. Do not have throw rugs and  other things on the floor that can make you trip. What can I do with my stairs? Do not leave any items on the stairs. Make sure that there are handrails on both sides of the stairs and use them. Fix handrails that are broken or loose. Make sure that handrails are as long as the stairways. Check any carpeting to make sure that it is firmly attached to the stairs. Fix any carpet that is loose or worn. Avoid having throw rugs at the top or bottom of the stairs. If you do have throw rugs, attach them to the floor with carpet tape. Make sure that you have a light switch at the top of the stairs and the bottom of the stairs. If you do not have  them, ask someone to add them for you. What else can I do to help prevent falls? Wear shoes that: Do not have high heels. Have rubber bottoms. Are comfortable and fit you well. Are closed at the toe. Do not wear sandals. If you use a stepladder: Make sure that it is fully opened. Do not climb a closed stepladder. Make sure that both sides of the stepladder are locked into place. Ask someone to hold it for you, if possible. Clearly mark and make sure that you can see: Any grab bars or handrails. First and last steps. Where the edge of each step is. Use tools that help you move around (mobility aids) if they are needed. These include: Canes. Walkers. Scooters. Crutches. Turn on the lights when you go into a dark area. Replace any light bulbs as soon as they burn out. Set up your furniture so you have a clear path. Avoid moving your furniture around. If any of your floors are uneven, fix them. If there are any pets around you, be aware of where they are. Review your medicines with your doctor. Some medicines can make you feel dizzy. This can increase your chance of falling. Ask your doctor what other things that you can do to help prevent falls. This information is not intended to replace advice given to you by your health care provider. Make sure you discuss any questions you have with your health care provider. Document Released: 11/21/2008 Document Revised: 07/03/2015 Document Reviewed: 03/01/2014 Elsevier Interactive Patient Education  2017 [employer].

## 2020-11-13 ENCOUNTER — Encounter: Payer: Self-pay | Admitting: Podiatry

## 2020-11-13 ENCOUNTER — Other Ambulatory Visit: Payer: Self-pay | Admitting: Podiatry

## 2020-11-13 DIAGNOSIS — M2042 Other hammer toe(s) (acquired), left foot: Secondary | ICD-10-CM | POA: Diagnosis not present

## 2020-11-13 DIAGNOSIS — M2041 Other hammer toe(s) (acquired), right foot: Secondary | ICD-10-CM | POA: Diagnosis not present

## 2020-11-13 HISTORY — PX: FOOT SURGERY: SHX648

## 2020-11-13 MED ORDER — OXYCODONE-ACETAMINOPHEN 5-325 MG PO TABS: 1.0000 | ORAL_TABLET | ORAL | 0 refills | Status: DC | PRN

## 2020-11-13 NOTE — Progress Notes (Signed)
PRN postop 

## 2020-11-20 ENCOUNTER — Other Ambulatory Visit: Payer: Self-pay

## 2020-11-20 ENCOUNTER — Ambulatory Visit (INDEPENDENT_AMBULATORY_CARE_PROVIDER_SITE_OTHER): Payer: Medicare Other

## 2020-11-20 ENCOUNTER — Telehealth: Payer: Self-pay

## 2020-11-20 ENCOUNTER — Ambulatory Visit (INDEPENDENT_AMBULATORY_CARE_PROVIDER_SITE_OTHER): Payer: Medicare Other | Admitting: Podiatry

## 2020-11-20 DIAGNOSIS — M2041 Other hammer toe(s) (acquired), right foot: Secondary | ICD-10-CM

## 2020-11-20 DIAGNOSIS — Z9889 Other specified postprocedural states: Secondary | ICD-10-CM

## 2020-11-20 MED ORDER — OXYCODONE-ACETAMINOPHEN 5-325 MG PO TABS: 1.0000 | ORAL_TABLET | ORAL | 0 refills | Status: DC | PRN

## 2020-11-20 NOTE — Progress Notes (Signed)
Subjective:  Patient ID: Diane Pearson, female    DOB: Feb 15, 1955,  MRN: [id number]  No chief complaint on file.   DOS: 11-13-20 Procedure: Right second digit hammertoe repair  65 y.o. female returns for post-op check.  Patient is doing well.  Occasional pain.  She states that she is doing well on medication.  She would like a refill.  She has bandages clean dry and intact.  She is ambulating with surgical shoe  Review of Systems: Negative except as noted in the HPI. Denies N/V/F/Ch.  Past Medical History:  Diagnosis Date   Arthritis    CAD (coronary artery disease)    Diagnosed on CT scan   Depression    Depression    Family history of breast cancer    Fibromyalgia    Hypothyroidism    Insomnia    Menopause    Osteoporosis    Tremors of nervous system     Current Outpatient Medications:    oxyCODONE-acetaminophen (PERCOCET) 5-325 MG tablet, Take 1 tablet by mouth every 4 (four) hours as needed for severe pain., Disp: 30 tablet, Rfl: 0   Acetaminophen-Codeine 300-30 MG tablet, Take 1 tablet by mouth daily as needed., Disp: , Rfl:    amLODipine (NORVASC) 5 MG tablet, Take 1 tablet (5 mg total) by mouth daily., Disp: 90 tablet, Rfl: 1   Azelaic Acid (FINACEA) 15 % FOAM, Apply to mid face 1-2 times daily. (Patient not taking: Reported on 10/27/2020), Disp: 50 g, Rfl: 2   calcium carbonate (OS-CAL - DOSED IN MG OF ELEMENTAL CALCIUM) 1250 (500 Ca) MG tablet, Take 1 tablet by mouth., Disp: , Rfl:    cholecalciferol (VITAMIN D) 1000 UNITS tablet, Take 1,000 Units by mouth daily., Disp: , Rfl:    conjugated estrogens (PREMARIN) vaginal cream, Place 1 Applicatorful vaginally every other day. (Patient not taking: Reported on 10/27/2020), Disp: 42.5 g, Rfl: 6   cyclobenzaprine (FLEXERIL) 10 MG tablet, TAKE ONE TABLET BY MOUTH THREE TIMES A DAY FOR SPASMS, Disp: 270 tablet, Rfl: 0   DULoxetine (CYMBALTA) 60 MG capsule, Take 1 capsule (60 mg total) by mouth daily., Disp: 90 capsule, Rfl: 1    estrogen, conjugated,-medroxyprogesterone (PREMPRO) 0.45-1.5 MG tablet, Take 1 tablet by mouth daily. (Patient not taking: No sig reported), Disp: 90 tablet, Rfl: 1   famotidine (PEPCID) 10 MG tablet, Take 10 mg by mouth at bedtime., Disp: , Rfl:    hydrOXYzine (ATARAX/VISTARIL) 25 MG tablet, Take 1 tablet (25 mg total) by mouth daily. (Patient not taking: No sig reported), Disp: 90 tablet, Rfl: 1   Ivermectin (SOOLANTRA) 1 % CREA, Apply to affected areas on face at night daily. (Patient not taking: Reported on 10/27/2020), Disp: 30 g, Rfl: 2   levothyroxine (SYNTHROID) 50 MCG tablet, TAKE 1 TABLET BY MOUTH EVERY MORNING 30 MINUTES BEFORE BREAKFAST, Disp: 90 tablet, Rfl: 1   lidocaine (XYLOCAINE) 5 % ointment, Apply 1 application topically 3 (three) times daily as needed. , Disp: , Rfl: 1   Magnesium 400 MG TABS, Take 400 mg by mouth daily., Disp: , Rfl:    meloxicam (MOBIC) 7.5 MG tablet, Take one/day; max take 2/day if necessary, Disp: 180 tablet, Rfl: 1   metoprolol succinate (TOPROL-XL) 50 MG 24 hr tablet, TAKE ONE TABLET BY MOUTH DAILY WITH OR IMMEDIATELY FOLLOWING A MEAL, Disp: 90 tablet, Rfl: 1   mometasone (ELOCON) 0.1 % cream, Apply to affected areas as directed 1-2 times per day as needed. (Patient not taking: Reported on 10/27/2020),  Disp: 45 g, Rfl: 1   Multiple Vitamins-Minerals (CENTRUM SILVER ADULT 50+ PO), Take 1 tablet by mouth daily., Disp: , Rfl:    oxyCODONE-acetaminophen (PERCOCET) 5-325 MG tablet, Take 1 tablet by mouth every 4 (four) hours as needed for severe pain., Disp: 30 tablet, Rfl: 0   Potassium 99 MG TABS, Take 99 mg by mouth daily., Disp: , Rfl:    pregabalin (LYRICA) 100 MG capsule, Take 1 capsule (100 mg total) by mouth 2 (two) times daily. To take an additional 50mg  in the PM, Disp: 180 capsule, Rfl: 1   pregabalin (LYRICA) 50 MG capsule, Take 1 capsule (50 mg total) by mouth at bedtime. With the 100mg , Disp: 90 capsule, Rfl: 1   vitamin B-12 (CYANOCOBALAMIN) 500 MCG  tablet, Take 500 mcg by mouth daily., Disp: , Rfl:    VITAMIN E PO, Take by mouth daily., Disp: , Rfl:    XIIDRA 5 % SOLN, , Disp: , Rfl:    zolpidem (AMBIEN) 10 MG tablet, Take 1 tablet (10 mg total) by mouth at bedtime., Disp: 30 tablet, Rfl: 5  Social History   Tobacco Use  Smoking Status Every Day   Packs/day: 1.00   Years: 45.00   Pack years: 45.00   Types: Cigarettes  Smokeless Tobacco Never    Allergies  Allergen Reactions   Bactrim [Sulfamethoxazole-Trimethoprim] Nausea And Vomiting   Erythromycin Other (See Comments)    cramps   Gabapentin Other (See Comments)    tremors   Tamiflu [Oseltamivir Phosphate] Other (See Comments)    Cramps and stomach issues    Tramadol     headaches   Nabumetone Rash    itching   Nitrofurantoin Diarrhea and Nausea And Vomiting   Objective:  There were no vitals filed for this visit. There is no height or weight on file to calculate BMI. Constitutional Well developed. Well nourished.  Vascular Foot warm and well perfused. Capillary refill normal to all digits.   Neurologic Normal speech. Oriented to person, place, and time. Epicritic sensation to light touch grossly present bilaterally.  Dermatologic Skin healing well without signs of infection. Skin edges well coapted without signs of infection.  Orthopedic: Tenderness to palpation noted about the surgical site.   Radiographs: 3 views of skeletally mature the right foot: Good correction alignment noted.  Hardware is intact.  No signs of loosening or backing noted. Assessment:   1. Hammertoe of second toe of right foot   2. Status post foot surgery    Plan:  Patient was evaluated and treated and all questions answered.  S/p foot surgery right -Progressing as expected post-operatively. -XR: See above -WB Status: Weightbearing as tolerated in surgical shoe -Sutures: Intact.  No clinical signs of dehiscence noted no complication noted. -Medications: None -Foot  redressed.  No follow-ups on file.

## 2020-11-21 ENCOUNTER — Encounter: Payer: Medicare Other | Admitting: Podiatry

## 2020-11-21 MED ORDER — OXYCODONE-ACETAMINOPHEN 5-325 MG PO TABS: 1.0000 | ORAL_TABLET | ORAL | 0 refills | Status: DC | PRN

## 2020-11-24 DIAGNOSIS — M722 Plantar fascial fibromatosis: Secondary | ICD-10-CM | POA: Diagnosis not present

## 2020-11-24 DIAGNOSIS — M7918 Myalgia, other site: Secondary | ICD-10-CM | POA: Diagnosis not present

## 2020-11-24 DIAGNOSIS — M797 Fibromyalgia: Secondary | ICD-10-CM | POA: Diagnosis not present

## 2020-11-28 ENCOUNTER — Other Ambulatory Visit: Payer: Self-pay

## 2020-11-28 ENCOUNTER — Ambulatory Visit (INDEPENDENT_AMBULATORY_CARE_PROVIDER_SITE_OTHER): Payer: Medicare Other | Admitting: Podiatry

## 2020-11-28 DIAGNOSIS — Z9889 Other specified postprocedural states: Secondary | ICD-10-CM

## 2020-12-11 ENCOUNTER — Ambulatory Visit (INDEPENDENT_AMBULATORY_CARE_PROVIDER_SITE_OTHER): Payer: Medicare Other | Admitting: Obstetrics and Gynecology

## 2020-12-11 ENCOUNTER — Other Ambulatory Visit: Payer: Self-pay

## 2020-12-11 ENCOUNTER — Encounter: Payer: Self-pay | Admitting: Obstetrics and Gynecology

## 2020-12-11 VITALS — BP 130/80 | HR 82 | Wt 169.0 lb

## 2020-12-11 DIAGNOSIS — N8110 Cystocele, unspecified: Secondary | ICD-10-CM

## 2020-12-11 DIAGNOSIS — N393 Stress incontinence (female) (male): Secondary | ICD-10-CM | POA: Diagnosis not present

## 2020-12-11 DIAGNOSIS — K5904 Chronic idiopathic constipation: Secondary | ICD-10-CM

## 2020-12-11 DIAGNOSIS — N812 Incomplete uterovaginal prolapse: Secondary | ICD-10-CM | POA: Diagnosis not present

## 2020-12-11 MED ORDER — PHENAZOPYRIDINE HCL 95 MG PO TABS: 95.0000 mg | ORAL_TABLET | Freq: Once | ORAL | 0 refills | Status: DC | PRN

## 2020-12-11 NOTE — Progress Notes (Signed)
[facility] Return Visit  SUBJECTIVE  History of Present Illness: Diane Pearson is a 65 y.o. female seen in follow-up for prolapse and incontinence.   Still having an odor and discharge. Unsure if it is urine. Did not do the pyridium test. Was treated at last visit for bacterial vaginosis.  Only having bowel movements every 4 days, but the stool is soft. Feels she can urinate better after bowel movements. Has been drinking more water, but not on any bowel regimen.   Past Medical History: Patient  has a past medical history of Arthritis, CAD (coronary artery disease), Depression, Depression, Family history of breast cancer, Fibromyalgia, Hypothyroidism, Insomnia, Menopause, Osteoporosis, and Tremors of nervous system.   Past Surgical History: She  has a past surgical history that includes varicose veins (1998); Colonoscopy with propofol (N/A, 09/29/2018); and Breast biopsy (Left, 04/20/2016).   Medications: She has a current medication list which includes the following prescription(s): acetaminophen-codeine, amlodipine, calcium carbonate, cholecalciferol, cyclobenzaprine, duloxetine, famotidine, ivermectin, levothyroxine, lidocaine, magnesium, meloxicam, metoprolol succinate, multiple vitamins-minerals, oxycodone-acetaminophen, oxycodone-acetaminophen, phenazopyridine, potassium, pregabalin, pregabalin, vitamin b-12, vitamin e, xiidra, zolpidem, finacea, conjugated estrogens, prempro, hydroxyzine, and mometasone.   Allergies: Patient is allergic to bactrim [sulfamethoxazole-trimethoprim], erythromycin, gabapentin, tamiflu [oseltamivir phosphate], tramadol, nabumetone, and nitrofurantoin.   Social History: Patient  reports that she has been smoking cigarettes. She has a 45.00 pack-year smoking history. She has never used smokeless tobacco. She reports that she does not drink alcohol and does not use drugs.      OBJECTIVE     Physical Exam: Vitals:   12/11/20 1036  BP:  130/80  Pulse: 82  Weight: 169 lb (76.7 kg)    Gen: No apparent distress, A&O x 3.  Detailed Urogynecologic Evaluation:  Normal external genitalia.   Speculum revealed normal vaginal mucosa with atrophy. Normal appearing cervix. No discharge noted.    POP-Q  1.5                                            Aa   1.5                                           Ba  -5                                              C   4                                            Gh  3                                            Pb  8  tvl   -2                                            Ap  -2                                            Bp  -5.5                                              D      ASSESSMENT AND PLAN    Diane Pearson is a 65 y.o. with:  1. Uterovaginal prolapse, incomplete   2. Prolapse of anterior vaginal wall   3. Stress incontinence of urine     SUI - unsure if she is leaking urine. She was sent pyridium and will do a pad test - Will also have her undergo urodynamic testing since she is interested in surgery.   2. POP - We discussed two options for prolapse repair:  1) vaginal repair without mesh - Pros - safer, no mesh complications - Cons - not as strong as mesh repair, higher risk of recurrence  2) laparoscopic repair with mesh - Pros - stronger, better long-term success - Cons - risks of mesh implant (erosion into vagina or bladder, adhering to the rectum, pain) - these risks are lower than with a vaginal mesh but still exist - She is more interested in robotic TLH and sacrocolpopexy. Handout provided and will finalize plan for surgery after urodynamic testing.   3. Constipation - We discussed the importance of avoiding chronic straining, as it can exacerbate her pelvic floor symptoms; we discussed treating constipation and straining prior to surgery, as postoperative straining can lead to damage to the repair and  recurrence of symptoms.  - She will start with 1/2 cap miralax daily and titrate as needed to achieve BM at least every other day.    Proceed with urodynamic testing  Jaquita Folds, MD  Time spent: I spent 30 minutes dedicated to the care of this patient on the date of this encounter to include pre-visit review of records, face-to-face time with the patient and post visit documentation.

## 2020-12-11 NOTE — Patient Instructions (Signed)
Start Miralax- take 1 capful once a day for bowel movements. If you need to, decrease to half a capful or every other day.

## 2020-12-12 ENCOUNTER — Encounter: Payer: Self-pay | Admitting: Podiatry

## 2020-12-12 ENCOUNTER — Ambulatory Visit (INDEPENDENT_AMBULATORY_CARE_PROVIDER_SITE_OTHER): Payer: Medicare Other | Admitting: Podiatry

## 2020-12-12 ENCOUNTER — Ambulatory Visit (INDEPENDENT_AMBULATORY_CARE_PROVIDER_SITE_OTHER): Payer: Medicare Other

## 2020-12-12 DIAGNOSIS — M2041 Other hammer toe(s) (acquired), right foot: Secondary | ICD-10-CM | POA: Diagnosis not present

## 2020-12-12 DIAGNOSIS — Z9889 Other specified postprocedural states: Secondary | ICD-10-CM

## 2020-12-14 NOTE — Progress Notes (Signed)
   Subjective:  Patient presents today status post right second hammertoe repair. DOS: 11/13/2020.  Patient states that she is doing better.  She denies fever chills nausea vomiting shortness of breath or chest pain.  Sutures and percutaneous fixation pin is intact.  She presents for further treatment and evaluation  Past Medical History:  Diagnosis Date   Arthritis    CAD (coronary artery disease)    Diagnosed on CT scan   Depression    Depression    Family history of breast cancer    Fibromyalgia    Hypothyroidism    Insomnia    Menopause    Osteoporosis    Tremors of nervous system       Objective/Physical Exam Neurovascular status intact.  Skin incisions appear to be well coapted with sutures intact. No sign of infectious process noted. No dehiscence. No active bleeding noted.  Minimal edema noted to the surgical extremity.   Assessment: 1. s/p right second hammertoe repair. DOS: 11/13/2020   Plan of Care:  1. Patient was evaluated. 2.  Sutures were removed today 3.  Continue postsurgical shoe 4.  Recommend light dressing daily 5.  Return to clinic in 2 weeks for percutaneous pin removal   Edrick Kins, DPM [facility]  Dr. Edrick Kins, DPM    [address], [place] [address]                Office [phone]  Fax [phone]

## 2020-12-22 NOTE — Progress Notes (Signed)
   Subjective:  Patient presents today status post right second hammertoe repair. DOS: 11/13/2020.  Patient states that she is doing better.  Patient states overall she is doing well.  She does have some pain and tenderness occasionally.   Patient does have a new issue today that she would like to address regarding her thickened discolored toenails to the bilateral feet.  This is been present for several years.  She would like to discuss different treatment options  Past Medical History:  Diagnosis Date   Arthritis    CAD (coronary artery disease)    Diagnosed on CT scan   Depression    Depression    Family history of breast cancer    Fibromyalgia    Hypothyroidism    Insomnia    Menopause    Osteoporosis    Tremors of nervous system       Objective/Physical Exam Neurovascular status intact.  Skin incisions appear to be well coapted and healed.  No sign of infectious process noted. No dehiscence. No active bleeding noted.  Minimal edema noted to the surgical extremity.  Overall the patient is doing very well with good range of motion  There is some hyperkeratotic thickened dystrophic nails noted bilateral consistent with findings of onychomycosis of the toenails   Assessment: 1. s/p right second hammertoe repair. DOS: 11/13/2020 2.  Onychomycosis of toenails both   Plan of Care:  1. Patient was evaluated. 2.  Percutaneous fixation pin was removed today 3.  The patient may now increase her activity to full activity no restrictions 4.  Recommend good supportive shoes and sneakers 5.  OTC Tolcylen antifungal topicals provided for the patient 6.  Return to clinic as needed  Edrick Kins, DPM [facility]  Dr. Edrick Kins, DPM    [address], [place] [address]                Office [phone]  Fax [phone]

## 2020-12-23 DIAGNOSIS — M9903 Segmental and somatic dysfunction of lumbar region: Secondary | ICD-10-CM | POA: Diagnosis not present

## 2020-12-23 DIAGNOSIS — M545 Low back pain, unspecified: Secondary | ICD-10-CM | POA: Diagnosis not present

## 2021-01-11 ENCOUNTER — Other Ambulatory Visit: Payer: Self-pay | Admitting: Family Medicine

## 2021-01-11 NOTE — Telephone Encounter (Signed)
Requested medication (s) are due for refill today: yes  Requested medication (s) are on the active medication list: yes  Last refill:  07/11/20 # 180 1 RF  Future visit scheduled: yes  Notes to clinic:  med not delegated to NT to RF   Requested Prescriptions  Pending Prescriptions Disp Refills   pregabalin (LYRICA) 100 MG capsule [Pharmacy Med Name: PREGABALIN 100MG  CAPSULES] 180 capsule     Sig: TAKE 1 CAPSULE BY MOUTH TWICE DAILY     Not Delegated - Neurology:  Anticonvulsants - Controlled Failed - 01/11/2021 11:49 AM      Failed - This refill cannot be delegated      Passed - Valid encounter within last 12 months    Recent Outpatient Visits           6 months ago Aortic atherosclerosis ([place])   [facility], Megan P, DO   9 months ago Hypertension, unspecified type   [place], DO   9 months ago Hypertension, unspecified type   [employer], Megan P, DO   10 months ago Elevated blood pressure reading   [place], NP   1 year ago Routine general medical examination at a health care facility   [facility], Barb Merino, DO       Future Appointments             Tomorrow Valerie Roys, DO [place], [place]   In 9 months  [employer], [place]

## 2021-01-12 ENCOUNTER — Ambulatory Visit (INDEPENDENT_AMBULATORY_CARE_PROVIDER_SITE_OTHER): Payer: Medicare Other | Admitting: Family Medicine

## 2021-01-12 ENCOUNTER — Other Ambulatory Visit: Payer: Self-pay

## 2021-01-12 ENCOUNTER — Encounter: Payer: Self-pay | Admitting: Family Medicine

## 2021-01-12 VITALS — BP 133/80 | HR 72 | Temp 98.2°F | Ht 65.0 in | Wt 169.6 lb

## 2021-01-12 DIAGNOSIS — I10 Essential (primary) hypertension: Secondary | ICD-10-CM

## 2021-01-12 DIAGNOSIS — F332 Major depressive disorder, recurrent severe without psychotic features: Secondary | ICD-10-CM | POA: Diagnosis not present

## 2021-01-12 DIAGNOSIS — E782 Mixed hyperlipidemia: Secondary | ICD-10-CM

## 2021-01-12 DIAGNOSIS — M797 Fibromyalgia: Secondary | ICD-10-CM | POA: Diagnosis not present

## 2021-01-12 DIAGNOSIS — I700 Atherosclerosis of aorta: Secondary | ICD-10-CM | POA: Diagnosis not present

## 2021-01-12 DIAGNOSIS — E039 Hypothyroidism, unspecified: Secondary | ICD-10-CM

## 2021-01-12 DIAGNOSIS — Z0000 Encounter for general adult medical examination without abnormal findings: Secondary | ICD-10-CM | POA: Diagnosis not present

## 2021-01-12 DIAGNOSIS — R8281 Pyuria: Secondary | ICD-10-CM | POA: Diagnosis not present

## 2021-01-12 DIAGNOSIS — F5101 Primary insomnia: Secondary | ICD-10-CM

## 2021-01-12 LAB — URINALYSIS, ROUTINE W REFLEX MICROSCOPIC
Bilirubin, UA: NEGATIVE
Glucose, UA: NEGATIVE
Ketones, UA: NEGATIVE
Nitrite, UA: NEGATIVE
Protein,UA: NEGATIVE
Specific Gravity, UA: 1.02 (ref 1.005–1.030)
Urobilinogen, Ur: 0.2 mg/dL (ref 0.2–1.0)
pH, UA: 5.5 (ref 5.0–7.5)

## 2021-01-12 LAB — MICROSCOPIC EXAMINATION

## 2021-01-12 LAB — MICROALBUMIN, URINE WAIVED
Creatinine, Urine Waived: 50 mg/dL (ref 10–300)
Microalb, Ur Waived: 30 mg/L — ABNORMAL HIGH (ref 0–19)

## 2021-01-12 MED ORDER — PREGABALIN 50 MG PO CAPS: 50.0000 mg | ORAL_CAPSULE | Freq: Every day | ORAL | 1 refills | Status: AC

## 2021-01-12 MED ORDER — ZOLPIDEM TARTRATE 10 MG PO TABS: 10.0000 mg | ORAL_TABLET | Freq: Every day | ORAL | 5 refills | Status: DC

## 2021-01-12 MED ORDER — PREGABALIN 100 MG PO CAPS: 100.0000 mg | ORAL_CAPSULE | Freq: Two times a day (BID) | ORAL | 1 refills | Status: AC

## 2021-01-12 MED ORDER — AMLODIPINE BESYLATE 5 MG PO TABS: 5.0000 mg | ORAL_TABLET | Freq: Every day | ORAL | 1 refills | Status: DC

## 2021-01-12 MED ORDER — CYCLOBENZAPRINE HCL 10 MG PO TABS: ORAL_TABLET | ORAL | 0 refills | Status: DC

## 2021-01-12 MED ORDER — METOPROLOL SUCCINATE ER 50 MG PO TB24: ORAL_TABLET | ORAL | 1 refills | Status: DC

## 2021-01-12 MED ORDER — DULOXETINE HCL 60 MG PO CPEP: 60.0000 mg | ORAL_CAPSULE | Freq: Every day | ORAL | 1 refills | Status: AC

## 2021-01-12 MED ORDER — MELOXICAM 7.5 MG PO TABS: ORAL_TABLET | ORAL | 1 refills | Status: DC

## 2021-01-12 NOTE — Progress Notes (Signed)
BP 133/80   Pulse 72   Temp 98.2 F (36.8 C)   Ht _0  (1.651 m)   Wt 169 lb 9.6 oz (76.9 kg)   LMP 09/28/2008 (Approximate)   SpO2 99%   BMI 28.22 kg/m    Subjective:    Patient ID: Diane Pearson, female    DOB: 1955/11/21, 65 y.o.   MRN: [id number]  HPI: Diane Pearson is a 65 y.o. female presenting on 01/12/2021 for comprehensive medical examination. Current medical complaints include:  HYPERTENSION / HYPERLIPIDEMIA Satisfied with current treatment? yes Duration of hypertension: chronic BP monitoring frequency: not checking BP medication side effects: no Duration of hyperlipidemia: chronic Cholesterol medication side effects: no Cholesterol supplements: none Past cholesterol medications: none Medication compliance: excellent compliance Aspirin: no Recent stressors: no Recurrent headaches: no Visual changes: no Palpitations: no Dyspnea: no Chest pain: no Lower extremity edema: no Dizzy/lightheaded: yes  HYPOTHYROIDISM Thyroid control status:controlled Satisfied with current treatment? yes Medication side effects: no Medication compliance: excellent compliance Etiology of hypothyroidism:  Recent dose adjustment:no Fatigue: yes Cold intolerance: no Heat intolerance: no Weight gain: no Weight loss: no Constipation: no Diarrhea/loose stools: no Palpitations: no Lower extremity edema: no Anxiety/depressed mood: no  DEPRESSION Mood status: stable Satisfied with current treatment?: yes Symptom severity: mild  Duration of current treatment : chronic Side effects: no Medication compliance: excellent compliance Psychotherapy/counseling: no  Previous psychiatric medications: cymbalta Depressed mood: yes Anxious mood: no Anhedonia: no Significant weight loss or gain: no Insomnia: yes  Fatigue: yes Feelings of worthlessness or guilt: no Impaired concentration/indecisiveness: no Suicidal ideations: no Hopelessness: no Crying spells: no Depression  screen [facility] 2/9 01/12/2021 10/27/2020 07/11/2020 04/10/2020 10/08/2019  Decreased Interest 1 0 0 0 0  Down, Depressed, Hopeless 0 0 0 0 2  PHQ - 2 Score 1 0 0 0 2  Altered sleeping 3 - 0 1 0  Tired, decreased energy 3 - 0 0 3  Change in appetite 0 - 0 0 0  Feeling bad or failure about yourself  0 - 0 0 0  Trouble concentrating 3 - 0 1 3  Moving slowly or fidgety/restless 0 - 0 0 0  Suicidal thoughts 0 - 0 0 0  PHQ-9 Score 10 - 0 2 8  Difficult doing work/chores - - - Not difficult at all Somewhat difficult  Some recent data might be hidden   INSOMNIA Duration: chronic Satisfied with sleep quality: yes Difficulty falling asleep: no Difficulty staying asleep: no Waking a few hours after sleep onset: no Early morning awakenings: no Daytime hypersomnolence: no Wakes feeling refreshed: yes Good sleep hygiene: yes Apnea: no Snoring: no Depressed/anxious mood: yes Recent stress: no Restless legs/nocturnal leg cramps: no Chronic pain/arthritis: no Treatments attempted: melatonin, uinsom, benadryl, and ambien    Menopausal Symptoms: yes  Depression Screen done today and results listed below:  Depression screen [facility] 2/9 01/12/2021 10/27/2020 07/11/2020 04/10/2020 10/08/2019  Decreased Interest 1 0 0 0 0  Down, Depressed, Hopeless 0 0 0 0 2  PHQ - 2 Score 1 0 0 0 2  Altered sleeping 3 - 0 1 0  Tired, decreased energy 3 - 0 0 3  Change in appetite 0 - 0 0 0  Feeling bad or failure about yourself  0 - 0 0 0  Trouble concentrating 3 - 0 1 3  Moving slowly or fidgety/restless 0 - 0 0 0  Suicidal thoughts 0 - 0 0 0  PHQ-9 Score 10 - 0 2 8  Difficult doing work/chores - - - Not difficult at all Somewhat difficult  Some recent data might be hidden    Past Medical History:  Past Medical History:  Diagnosis Date   Arthritis    CAD (coronary artery disease)    Diagnosed on CT scan   Depression    Depression    Family history of breast cancer    Fibromyalgia    Hypothyroidism    Insomnia     Menopause    Osteoporosis    Tremors of nervous system     Surgical History:  Past Surgical History:  Procedure Laterality Date   BREAST BIOPSY Left 04/20/2016   COLUMNAR CELL CHANGE   COLONOSCOPY WITH PROPOFOL N/A 09/29/2018   Procedure: COLONOSCOPY WITH PROPOFOL;  Surgeon: [name], MD;  Location: [facility] ENDOSCOPY;  Service: Endoscopy;  Laterality: N/A;   varicose veins  1998    Medications:  Current Outpatient Medications on File Prior to Visit  Medication Sig   calcium carbonate (OS-CAL - DOSED IN MG OF ELEMENTAL CALCIUM) 1250 (500 Ca) MG tablet Take 1 tablet by mouth.   cholecalciferol (VITAMIN D) 1000 UNITS tablet Take 1,000 Units by mouth daily.   famotidine (PEPCID) 10 MG tablet Take 10 mg by mouth at bedtime.   lidocaine (XYLOCAINE) 5 % ointment Apply 1 application topically 3 (three) times daily as needed.    Magnesium 400 MG TABS Take 400 mg by mouth daily.   mometasone (ELOCON) 0.1 % cream Apply to affected areas as directed 1-2 times per day as needed.   Multiple Vitamins-Minerals (CENTRUM SILVER ADULT 50+ PO) Take 1 tablet by mouth daily.   oxyCODONE-acetaminophen (PERCOCET) 5-325 MG tablet Take 1 tablet by mouth every 4 (four) hours as needed for severe pain.   oxyCODONE-acetaminophen (PERCOCET) 5-325 MG tablet Take 1 tablet by mouth every 4 (four) hours as needed for severe pain.   Potassium 99 MG TABS Take 99 mg by mouth daily.   vitamin B-12 (CYANOCOBALAMIN) 500 MCG tablet Take 500 mcg by mouth daily.   VITAMIN E PO Take by mouth daily.   Acetaminophen-Codeine 300-30 MG tablet Take 1 tablet by mouth daily as needed. (Patient not taking: Reported on 01/12/2021)   phenazopyridine (PYRIDIUM) 95 MG tablet Take 1 tablet (95 mg total) by mouth once as needed for up to 1 dose for pain. (Patient not taking: Reported on 01/12/2021)   XIIDRA 5 % SOLN  (Patient not taking: Reported on 01/12/2021)   No current facility-administered medications on file prior to visit.     Allergies:  Allergies  Allergen Reactions   Bactrim [Sulfamethoxazole-Trimethoprim] Nausea And Vomiting   Erythromycin Other (See Comments)    cramps   Gabapentin Other (See Comments)    tremors   Tamiflu [Oseltamivir Phosphate] Other (See Comments)    Cramps and stomach issues    Tramadol     headaches   Nabumetone Rash    itching   Nitrofurantoin Diarrhea and Nausea And Vomiting    Social History:  Social History   Socioeconomic History   Marital status: Divorced    Spouse name: Not on file   Number of children: 1   Years of education: Not on file   Highest education level: 12th grade  Occupational History   Occupation: disability   Tobacco Use   Smoking status: Every Day    Packs/day: 1.00    Years: 45.00    Pack years: 45.00    Types: Cigarettes   Smokeless tobacco: Never  Vaping Use   Vaping Use: Never used  Substance and Sexual Activity   Alcohol use: No    Alcohol/week: 0.0 standard drinks   Drug use: No   Sexual activity: Not Currently  Other Topics Concern   Not on file  Social History Narrative   Not on file   Social Determinants of Health   Financial Resource Strain: Low Risk    Difficulty of Paying Living Expenses: Not hard at all  Food Insecurity: No Food Insecurity   Worried About Charity fundraiser in the Last Year: Never true   [place] in the Last Year: Never true  Transportation Needs: No Transportation Needs   Lack of Transportation (Medical): No   Lack of Transportation (Non-Medical): No  Physical Activity: Insufficiently Active   Days of Exercise per Week: 3 days   Minutes of Exercise per Session: 30 min  Stress: No Stress Concern Present   Feeling of Stress : Not at all  Social Connections: Not on file  Intimate Partner Violence: Not on file   Social History   Tobacco Use  Smoking Status Every Day   Packs/day: 1.00   Years: 45.00   Pack years: 45.00   Types: Cigarettes  Smokeless Tobacco Never   Social  History   Substance and Sexual Activity  Alcohol Use No   Alcohol/week: 0.0 standard drinks    Family History:  Family History  Problem Relation Age of Onset   Cancer Father        Lung   Tuberculosis Father    Breast cancer Maternal Grandmother 22   Ulcerative colitis Sister    Anxiety disorder Daughter    Alzheimer's disease Maternal Grandfather    Heart disease Paternal Grandmother        MI   Heart disease Paternal Grandfather        MI   Breast cancer Maternal Aunt        70's    Past medical history, surgical history, medications, allergies, family history and social history reviewed with patient today and changes made to appropriate areas of the chart.   Review of Systems  Constitutional: Negative.   HENT: Negative.    Eyes:  Positive for blurred vision. Negative for double vision, photophobia, pain, discharge and redness.  Respiratory:  Positive for wheezing. Negative for cough, hemoptysis, sputum production and shortness of breath.   Cardiovascular: Negative.   Gastrointestinal:  Positive for constipation. Negative for abdominal pain, blood in stool, diarrhea, heartburn, melena, nausea and vomiting.  Genitourinary: Negative.   Musculoskeletal:  Positive for back pain, joint pain and myalgias. Negative for falls and neck pain.  Skin: Negative.   Neurological:  Positive for dizziness and tingling. Negative for tremors, sensory change, speech change, focal weakness, seizures, loss of consciousness, weakness and headaches.  Endo/Heme/Allergies: Negative.   Psychiatric/Behavioral: Negative.    All other ROS negative except what is listed above and in the HPI.      Objective:    BP 133/80   Pulse 72   Temp 98.2 F (36.8 C)   Ht _0  (1.651 m)   Wt 169 lb 9.6 oz (76.9 kg)   LMP 09/28/2008 (Approximate)   SpO2 99%   BMI 28.22 kg/m   Wt Readings from Last 3 Encounters:  01/12/21 169 lb 9.6 oz (76.9 kg)  12/11/20 169 lb (76.7 kg)  10/27/20 165 lb (74.8 kg)     Physical Exam Vitals and nursing note reviewed.  Constitutional:  General: She is not in acute distress.    Appearance: Normal appearance. She is not ill-appearing, toxic-appearing or diaphoretic.  HENT:     Head: Normocephalic and atraumatic.     Right Ear: Tympanic membrane, ear canal and external ear normal. There is no impacted cerumen.     Left Ear: Tympanic membrane, ear canal and external ear normal. There is no impacted cerumen.     Nose: Nose normal. No congestion or rhinorrhea.     Mouth/Throat:     Mouth: Mucous membranes are moist.     Pharynx: Oropharynx is clear. No oropharyngeal exudate or posterior oropharyngeal erythema.  Eyes:     General: No scleral icterus.       Right eye: No discharge.        Left eye: No discharge.     Extraocular Movements: Extraocular movements intact.     Conjunctiva/sclera: Conjunctivae normal.     Pupils: Pupils are equal, round, and reactive to light.  Neck:     Vascular: No carotid bruit.  Cardiovascular:     Rate and Rhythm: Normal rate and regular rhythm.     Pulses: Normal pulses.     Heart sounds: No murmur heard.   No friction rub. No gallop.  Pulmonary:     Effort: Pulmonary effort is normal. No respiratory distress.     Breath sounds: Normal breath sounds. No stridor. No wheezing, rhonchi or rales.  Chest:     Chest wall: No tenderness.  Abdominal:     General: Abdomen is flat. Bowel sounds are normal. There is no distension.     Palpations: Abdomen is soft. There is no mass.     Tenderness: There is no abdominal tenderness. There is no right CVA tenderness, left CVA tenderness, guarding or rebound.     Hernia: No hernia is present.  Genitourinary:    Comments: Breast and pelvic exams deferred with shared decision making Musculoskeletal:        General: No swelling, tenderness, deformity or signs of injury.     Cervical back: Normal range of motion and neck supple. No rigidity. No muscular tenderness.     Right  lower leg: No edema.     Left lower leg: No edema.  Lymphadenopathy:     Cervical: No cervical adenopathy.  Skin:    General: Skin is warm and dry.     Capillary Refill: Capillary refill takes less than 2 seconds.     Coloration: Skin is not jaundiced or pale.     Findings: No bruising, erythema, lesion or rash.  Neurological:     General: No focal deficit present.     Mental Status: She is alert and oriented to person, place, and time. Mental status is at baseline.     Cranial Nerves: No cranial nerve deficit.     Sensory: No sensory deficit.     Motor: No weakness.     Coordination: Coordination normal.     Gait: Gait normal.     Deep Tendon Reflexes: Reflexes normal.  Psychiatric:        Mood and Affect: Mood normal.        Behavior: Behavior normal.        Thought Content: Thought content normal.        Judgment: Judgment normal.    Results for orders placed or performed in visit on 01/12/21  Microscopic Examination   Urine  Result Value Ref Range   WBC, UA 0-5 0 - 5 /hpf  RBC 0-2 0 - 2 /hpf   Epithelial Cells (non renal) 0-10 0 - 10 /hpf   Mucus, UA Present (A) Not Estab.   Bacteria, UA Few (A) None seen/Few  CBC with Differential/Platelet  Result Value Ref Range   WBC 7.2 3.4 - 10.8 x10E3/uL   RBC 3.45 (L) 3.77 - 5.28 x10E6/uL   Hemoglobin 11.1 11.1 - 15.9 g/dL   Hematocrit 32.3 (L) 34.0 - 46.6 %   MCV 94 79 - 97 fL   MCH 32.2 26.6 - 33.0 pg   MCHC 34.4 31.5 - 35.7 g/dL   RDW 12.7 11.7 - 15.4 %   Platelets 194 150 - 450 x10E3/uL   Neutrophils 47 Not Estab. %   Lymphs 32 Not Estab. %   Monocytes 7 Not Estab. %   Eos 12 Not Estab. %   Basos 2 Not Estab. %   Neutrophils Absolute 3.4 1.4 - 7.0 x10E3/uL   Lymphocytes Absolute 2.3 0.7 - 3.1 x10E3/uL   Monocytes Absolute 0.5 0.1 - 0.9 x10E3/uL   EOS (ABSOLUTE) 0.9 (H) 0.0 - 0.4 x10E3/uL   Basophils Absolute 0.1 0.0 - 0.2 x10E3/uL   Immature Granulocytes 0 Not Estab. %   Immature Grans (Abs) 0.0 0.0 - 0.1  x10E3/uL  Comprehensive metabolic panel  Result Value Ref Range   Glucose 80 70 - 99 mg/dL   BUN 18 8 - 27 mg/dL   Creatinine, Ser 1.14 (H) 0.57 - 1.00 mg/dL   eGFR 54 (L) >59 mL/min/1.73   BUN/Creatinine Ratio 16 12 - 28   Sodium 141 134 - 144 mmol/L   Potassium 4.3 3.5 - 5.2 mmol/L   Chloride 103 96 - 106 mmol/L   CO2 25 20 - 29 mmol/L   Calcium 9.3 8.7 - 10.3 mg/dL   Total Protein 6.6 6.0 - 8.5 g/dL   Albumin 4.2 3.8 - 4.8 g/dL   Globulin, Total 2.4 1.5 - 4.5 g/dL   Albumin/Globulin Ratio 1.8 1.2 - 2.2   Bilirubin Total 0.2 0.0 - 1.2 mg/dL   Alkaline Phosphatase 156 (H) 44 - 121 IU/L   AST 23 0 - 40 IU/L   ALT 16 0 - 32 IU/L  Lipid Panel w/o Chol/HDL Ratio  Result Value Ref Range   Cholesterol, Total 174 100 - 199 mg/dL   Triglycerides 130 0 - 149 mg/dL   HDL 33 (L) >39 mg/dL   VLDL Cholesterol Cal 24 5 - 40 mg/dL   LDL Chol Calc (NIH) 117 (H) 0 - 99 mg/dL  Urinalysis, Routine w reflex microscopic  Result Value Ref Range   Specific Gravity, UA 1.020 1.005 - 1.030   pH, UA 5.5 5.0 - 7.5   Color, UA Yellow Yellow   Appearance Ur Cloudy (A) Clear   Leukocytes,UA 2+ (A) Negative   Protein,UA Negative Negative/Trace   Glucose, UA Negative Negative   Ketones, UA Negative Negative   RBC, UA Trace (A) Negative   Bilirubin, UA Negative Negative   Urobilinogen, Ur 0.2 0.2 - 1.0 mg/dL   Nitrite, UA Negative Negative   Microscopic Examination See below:   TSH  Result Value Ref Range   TSH 1.940 0.450 - 4.500 uIU/mL  Microalbumin, Urine Waived  Result Value Ref Range   Microalb, Ur Waived 30 (H) 0 - 19 mg/L   Creatinine, Urine Waived 50 10 - 300 mg/dL   Microalb/Creat Ratio 30-300 (H) <30 mg/g      Assessment & Plan:   Problem List Items Addressed This Visit  Cardiovascular and Mediastinum   Hypertension    Under good control on current regimen. Continue current regimen. Continue to monitor. Call with any concerns. Refills given. Labs drawn today.        Relevant Medications   amLODipine (NORVASC) 5 MG tablet   metoprolol succinate (TOPROL-XL) 50 MG 24 hr tablet   Other Relevant Orders   CBC with Differential/Platelet (Completed)   Comprehensive metabolic panel (Completed)   Urinalysis, Routine w reflex microscopic (Completed)   Microalbumin, Urine Waived (Completed)   Aortic atherosclerosis (HCC)    Will keep BP and cholesterol under good control. Continue to monitor. Call with any concerns.       Relevant Medications   amLODipine (NORVASC) 5 MG tablet   metoprolol succinate (TOPROL-XL) 50 MG 24 hr tablet   Other Relevant Orders   CBC with Differential/Platelet (Completed)   Comprehensive metabolic panel (Completed)   Lipid Panel w/o Chol/HDL Ratio (Completed)     Endocrine   Hypothyroid    Rechecking labs today. Await results. Treat as needed.       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 Continue current regimen. Continue to monitor. Call with any concerns. Refills given. Labs drawn today.       Relevant Medications   DULoxetine (CYMBALTA) 60 MG capsule   Other Relevant Orders   CBC with Differential/Platelet (Completed)   Comprehensive metabolic panel (Completed)   Insomnia    Under good control on current regimen. Continue current regimen. Continue to monitor. Call with any concerns. Refills given. Labs drawn today.       Fibromyalgia    Under good control on current regimen. Continue current regimen. Continue to monitor. Call with any concerns. Refills given. Labs drawn today.       Relevant Medications   cyclobenzaprine (FLEXERIL) 10 MG tablet   DULoxetine (CYMBALTA) 60 MG capsule   meloxicam (MOBIC) 7.5 MG tablet   pregabalin (LYRICA) 100 MG capsule    pregabalin (LYRICA) 50 MG capsule   Hyperlipidemia    Rechecking labs today. Await results. Treat as needed.       Relevant Medications   amLODipine (NORVASC) 5 MG tablet   metoprolol succinate (TOPROL-XL) 50 MG 24 hr tablet   Other Relevant Orders   CBC with Differential/Platelet (Completed)   Comprehensive metabolic panel (Completed)   Lipid Panel w/o Chol/HDL Ratio (Completed)   Other Visit Diagnoses     Routine general medical examination at a health care facility    -  Primary   Vaccines up to date. Screening labs checked today. Pap N/A. Mammo and colonoscopy up to date. Continue diet and exercise. Call with any concerns.    Pyuria       Will check culture. Await results. Treat as needed.    Relevant Orders   Urine Culture        Follow up plan: Return in about 6 months (around 07/13/2021).   LABORATORY TESTING:  - Pap smear: up to date  IMMUNIZATIONS:   - Tdap: Tetanus vaccination status reviewed: last tetanus booster within 10 years. - Influenza: Refused - Pneumovax: Up to date - Prevnar: Not applicable - COVID: Up to date - HPV: Not applicable - Shingrix vaccine: Up to date  SCREENING: -Mammogram: Up to date  - Colonoscopy: Up to date   PATIENT COUNSELING:   Advised to take 1 mg of folate supplement per day if capable of pregnancy.   Sexuality: Discussed sexually transmitted diseases, partner selection, use of condoms, avoidance of unintended pregnancy  and contraceptive alternatives.   Advised to avoid cigarette smoking.  I discussed with the patient that most people either abstain from alcohol or drink within safe limits (<=14/week and <=4 drinks/occasion for males, <=7/weeks and <= 3 drinks/occasion for females) and that the risk for alcohol disorders and other health effects rises proportionally with the number of drinks per week and how often a drinker exceeds daily limits.  Discussed cessation/primary prevention of drug use and availability of  treatment for abuse.   Diet: Encouraged to adjust caloric intake to maintain  or achieve ideal body weight, to reduce intake of dietary saturated fat and total fat, to limit sodium intake by avoiding high sodium foods and not adding table salt, and to maintain adequate dietary potassium and calcium preferably from fresh fruits, vegetables, and low-fat dairy products.    stressed the importance of regular exercise  Injury prevention: Discussed safety belts, safety helmets, smoke detector, smoking near bedding or upholstery.   Dental health: Discussed importance of regular tooth brushing, flossing, and dental visits.    NEXT PREVENTATIVE PHYSICAL DUE IN 1 YEAR. Return in about 6 months (around 07/13/2021).

## 2021-01-13 ENCOUNTER — Other Ambulatory Visit: Payer: Self-pay | Admitting: Family Medicine

## 2021-01-13 ENCOUNTER — Telehealth: Payer: Self-pay | Admitting: Obstetrics and Gynecology

## 2021-01-13 ENCOUNTER — Encounter: Payer: Self-pay | Admitting: Obstetrics and Gynecology

## 2021-01-13 DIAGNOSIS — N950 Postmenopausal bleeding: Secondary | ICD-10-CM

## 2021-01-13 LAB — CBC WITH DIFFERENTIAL/PLATELET
Basophils Absolute: 0.1 10*3/uL (ref 0.0–0.2)
Basos: 2 %
EOS (ABSOLUTE): 0.9 10*3/uL — ABNORMAL HIGH (ref 0.0–0.4)
Eos: 12 %
Hematocrit: 32.3 % — ABNORMAL LOW (ref 34.0–46.6)
Hemoglobin: 11.1 g/dL (ref 11.1–15.9)
Immature Grans (Abs): 0 10*3/uL (ref 0.0–0.1)
Immature Granulocytes: 0 %
Lymphocytes Absolute: 2.3 10*3/uL (ref 0.7–3.1)
Lymphs: 32 %
MCH: 32.2 pg (ref 26.6–33.0)
MCHC: 34.4 g/dL (ref 31.5–35.7)
MCV: 94 fL (ref 79–97)
Monocytes Absolute: 0.5 10*3/uL (ref 0.1–0.9)
Monocytes: 7 %
Neutrophils Absolute: 3.4 10*3/uL (ref 1.4–7.0)
Neutrophils: 47 %
Platelets: 194 10*3/uL (ref 150–450)
RBC: 3.45 x10E6/uL — ABNORMAL LOW (ref 3.77–5.28)
RDW: 12.7 % (ref 11.7–15.4)
WBC: 7.2 10*3/uL (ref 3.4–10.8)

## 2021-01-13 LAB — LIPID PANEL W/O CHOL/HDL RATIO
Cholesterol, Total: 174 mg/dL (ref 100–199)
HDL: 33 mg/dL — ABNORMAL LOW (ref >39)
LDL Chol Calc (NIH): 117 mg/dL — ABNORMAL HIGH (ref 0–99)
Triglycerides: 130 mg/dL (ref 0–149)
VLDL Cholesterol Cal: 24 mg/dL (ref 5–40)

## 2021-01-13 LAB — COMPREHENSIVE METABOLIC PANEL
ALT: 16 IU/L (ref 0–32)
AST: 23 IU/L (ref 0–40)
Albumin/Globulin Ratio: 1.8 (ref 1.2–2.2)
Albumin: 4.2 g/dL (ref 3.8–4.8)
Alkaline Phosphatase: 156 IU/L — ABNORMAL HIGH (ref 44–121)
BUN/Creatinine Ratio: 16 (ref 12–28)
BUN: 18 mg/dL (ref 8–27)
Bilirubin Total: 0.2 mg/dL (ref 0.0–1.2)
CO2: 25 mmol/L (ref 20–29)
Calcium: 9.3 mg/dL (ref 8.7–10.3)
Chloride: 103 mmol/L (ref 96–106)
Creatinine, Ser: 1.14 mg/dL — ABNORMAL HIGH (ref 0.57–1.00)
Globulin, Total: 2.4 g/dL (ref 1.5–4.5)
Glucose: 80 mg/dL (ref 70–99)
Potassium: 4.3 mmol/L (ref 3.5–5.2)
Sodium: 141 mmol/L (ref 134–144)
Total Protein: 6.6 g/dL (ref 6.0–8.5)
eGFR: 54 mL/min/{1.73_m2} — ABNORMAL LOW (ref >59)

## 2021-01-13 LAB — TSH: TSH: 1.94 u[IU]/mL (ref 0.450–4.500)

## 2021-01-13 MED ORDER — LEVOTHYROXINE SODIUM 50 MCG PO TABS: ORAL_TABLET | ORAL | 3 refills | Status: AC

## 2021-01-13 NOTE — Telephone Encounter (Signed)
Patient called and states she is having brown/ red discharge for the last few days. Will order transvaginal ultrasound to further evaluate.   Jaquita Folds, MD

## 2021-01-13 NOTE — Assessment & Plan Note (Signed)
Rechecking labs today. Await results. Treat as needed.  °

## 2021-01-13 NOTE — Assessment & Plan Note (Signed)
Under good control on current regimen. Continue current regimen. Continue to monitor. Call with any concerns. Refills given. Labs drawn today.   

## 2021-01-13 NOTE — Assessment & Plan Note (Signed)
Will keep BP and cholesterol under good control. Continue to monitor. Call with any concerns.  

## 2021-01-14 LAB — URINE CULTURE

## 2021-02-15 ENCOUNTER — Other Ambulatory Visit: Payer: Self-pay | Admitting: Family Medicine

## 2021-02-16 NOTE — Telephone Encounter (Signed)
Requested medication (s) are due for refill today:   Provider to review  Requested medication (s) are on the active medication list:   Yes  Future visit scheduled:   Yes   Last ordered: 01/12/2021 #30, 5 refills  Non delegated refill   Requested Prescriptions  Pending Prescriptions Disp Refills   zolpidem (AMBIEN) 10 MG tablet [Pharmacy Med Name: ZOLPIDEM 10MG  TABLETS] 30 tablet     Sig: TAKE 1 TABLET BY MOUTH EVERY NIGHT AT BEDTIME     Not Delegated - Psychiatry:  Anxiolytics/Hypnotics Failed - 02/15/2021  7:32 PM      Failed - This refill cannot be delegated      Failed - Urine Drug Screen completed in last 360 days      Passed - Valid encounter within last 6 months    Recent Outpatient Visits           1 month ago Routine general medical examination at a health care facility   [facility] [place] P, DO   7 months ago Aortic atherosclerosis [facility]   Cook, Megan P, DO   10 months ago Hypertension, unspecified type   [facility], Megan P, DO   11 months ago Hypertension, unspecified type   [facility], Megan P, DO   11 months ago Elevated blood pressure reading   [facility] Jon Billings, NP       Future Appointments             In 1 month Wynetta Emery, Barb Merino, DO [place], [place]   In 8 months  [facility] [employer]

## 2021-02-19 ENCOUNTER — Ambulatory Visit
Admission: RE | Admit: 2021-02-19 | Discharge: 2021-02-19 | Disposition: A | Discharge status: Home / Self Care | Payer: Medicare Other | Source: Ambulatory Visit | Attending: Obstetrics and Gynecology | Admitting: Obstetrics and Gynecology

## 2021-02-19 ENCOUNTER — Other Ambulatory Visit: Payer: Self-pay

## 2021-02-19 DIAGNOSIS — R9389 Abnormal findings on diagnostic imaging of other specified body structures: Secondary | ICD-10-CM | POA: Diagnosis not present

## 2021-02-19 DIAGNOSIS — N950 Postmenopausal bleeding: Secondary | ICD-10-CM | POA: Insufficient documentation

## 2021-02-20 DIAGNOSIS — R251 Tremor, unspecified: Secondary | ICD-10-CM | POA: Diagnosis not present

## 2021-02-20 DIAGNOSIS — M797 Fibromyalgia: Secondary | ICD-10-CM | POA: Diagnosis not present

## 2021-02-20 DIAGNOSIS — M7918 Myalgia, other site: Secondary | ICD-10-CM | POA: Diagnosis not present

## 2021-02-24 ENCOUNTER — Ambulatory Visit (INDEPENDENT_AMBULATORY_CARE_PROVIDER_SITE_OTHER): Payer: Commercial Managed Care - HMO | Admitting: Obstetrics and Gynecology

## 2021-02-24 ENCOUNTER — Other Ambulatory Visit: Payer: Self-pay

## 2021-02-24 VITALS — BP 151/94 | HR 76 | Ht 66.0 in | Wt 169.0 lb

## 2021-02-24 DIAGNOSIS — R339 Retention of urine, unspecified: Secondary | ICD-10-CM

## 2021-02-24 DIAGNOSIS — N393 Stress incontinence (female) (male): Secondary | ICD-10-CM

## 2021-02-24 DIAGNOSIS — R350 Frequency of micturition: Secondary | ICD-10-CM

## 2021-02-24 LAB — POCT URINALYSIS DIPSTICK
Appearance: NORMAL
Glucose, UA: NEGATIVE
Ketones, UA: NEGATIVE
Leukocytes, UA: NEGATIVE
Nitrite, UA: NEGATIVE
Odor: NORMAL
Protein, UA: NEGATIVE
Spec Grav, UA: 1.02 (ref 1.010–1.025)
Urobilinogen, UA: 0.2 E.U./dL
pH, UA: 6 (ref 5.0–8.0)

## 2021-02-24 NOTE — Patient Instructions (Signed)

## 2021-02-26 NOTE — Progress Notes (Signed)
[facility] Urodynamics Procedure  Referring Physician: Valerie Roys, DO Date of Procedure: 02/24/2021  Diane Pearson is a 66 y.o. female who presents for urodynamic evaluation. Indication(s) for study: SUI  Vital Signs: BP (!) 151/94    Pulse 76    Ht 5\' 6"  (1.676 m)    Wt 169 lb (76.7 kg)    LMP 09/28/2008 (Approximate)    BMI 27.28 kg/m   Laboratory Results: A catheterized urine specimen revealed:  POC urine: trace blood, otherwise negative   Voiding Diary: Not performed  Procedure Timeout:  The correct patient was verified and the correct procedure was verified. The patient was in the correct position and safety precautions were reviewed based on at the patient's history.  Urodynamic Procedure A 58F dual lumen urodynamics catheter was placed under sterile conditions into the patient's bladder. A 58F catheter was placed into the rectum in order to measure abdominal pressure. EMG patches were placed in the appropriate position.  All connections were confirmed and calibrations/adjusted made. Saline was instilled into the bladder through the dual lumen catheters.  Cough/valsalva pressures were measured periodically during filling.  Patient was allowed to void.  The bladder was then emptied of its residual.  UROFLOW: Unable to void with initial uroflow. Had a residual of 150 mL.    CMG: This was performed with sterile water in the sitting position at a fill rate of 30 mL/min.    First sensation of fullness was 188 mLs,  First urge was 267 mLs,  Strong urge was 302 mLs and  Capacity was 600 mLs  Stress incontinence was demonstrated Lowest positive Barrier CLPP was 95 cmH20 at 200 ml. Highest negative Barrier VLPP was 44 cmH20 at 200 ml.  Detrusor function was underactive, with no phasic contractions seen.    Compliance:  normal. End fill detrusor pressure was 0cmH20.    UPP: MUCP with barrier reduction was 45 cm of water.    MICTURITION STUDY: Voiding  was performed with reduction using scopettes in the sitting position. She was unable to void and there and had a residual of 600 mL.  A sustained detrusor contraction was not present- there was some small detrusor instability but no contraction. And abdominal straining was present  EMG: This was performed with patches.  She had voluntary contractions, recruitment with fill was present and urethral sphincter was relaxed.  The details of the procedure with the study tracings have been scanned into EPIC.   Urodynamic Impression:  1. Sensation was reduced; capacity was increased 2. Stress Incontinence was demonstrated at normal pressures; 3. Detrusor Overactivity was not demonstrated. 4. Emptying was dysfunctional with a elevated PVR (655ml), a sustained detrusor contraction not present,  abdominal straining present, normal urethral sphincter activity on EMG.  Plan: - The patient will follow up  to discuss the findings and treatment options.   Jaquita Folds, MD

## 2021-03-02 ENCOUNTER — Other Ambulatory Visit: Payer: Self-pay | Admitting: Family Medicine

## 2021-03-02 NOTE — Telephone Encounter (Signed)
Refilled 12/62/022 #[id number] refills. Requested Prescriptions  Pending Prescriptions Disp Refills   levothyroxine (SYNTHROID) 50 MCG tablet [Pharmacy Med Name: LEVOTHYROXINE 0.05MG  (50MCG) TAB] 90 tablet 3    Sig: TAKE 1 TABLET BY MOUTH EVERY MORNING 80 MINUTES BEFORE BREAKFAST     Endocrinology:  Hypothyroid Agents Failed - 03/02/2021  2:31 PM      Failed - TSH needs to be rechecked within 3 months after an abnormal result. Refill until TSH is due.      Passed - TSH in normal range and within 360 days    TSH  Date Value Ref Range Status  01/12/2021 1.940 0.450 - 4.500 uIU/mL Final         Passed - Valid encounter within last 12 months    Recent Outpatient Visits          1 month ago Routine general medical examination at a health care facility   [facility], [place] P, DO   7 months ago Aortic atherosclerosis [facility])   [facility], Megan P, DO   10 months ago Hypertension, unspecified type   [facility], Megan P, DO   11 months ago Hypertension, unspecified type   [facility], Megan P, DO   11 months ago Elevated blood pressure reading   [place], NP      Future Appointments            In 2 weeks Wannetta Sender Governor Rooks, MD Urogynecology at [facility], [facility]   In 1 month Wynetta Emery, Barb Merino, DO [place], [place]   In 8 months  [employer], [place]

## 2021-03-10 ENCOUNTER — Ambulatory Visit: Payer: Self-pay

## 2021-03-10 NOTE — Telephone Encounter (Signed)
2nd attempt, pt called. Goes straight to VM, left VM to call back to discuss with a nurse regarding symptoms.

## 2021-03-10 NOTE — Telephone Encounter (Signed)
°  Chief Complaint: Burning with urination Symptoms: Burning with urination and bladder "pressure" Some frequency Frequency: Onset this AM. Pertinent Negatives: Patient denies fever, back/flank pain Disposition: [] ED /[] Urgent Care (no appt availability in office) / [x] Appointment(In office/virtual)/ []  [facility]/ [] Home Care/ [] Refused Recommended Disposition /[] [place] [facility]/ []  Follow-up with PCP Additional Notes: Secured next available for Thursday AM with Dr. Neomia Dear. Pt asking if she could bring specimen by for lab. Assured pt NT would route to practice for PCPs review and final disposition.  After hours call.    Reason for Disposition  Urinating more frequently than usual (i.e., frequency)  Answer Assessment - Initial Assessment Questions 1. SYMPTOM: "What's the main symptom you're concerned about?" (e.g., frequency, incontinence)     Burning with urination, bladder pressure 2. ONSET: "When did the    start?"     This AM 3. PAIN: "Is there any pain?" If Yes, ask: "How bad is it?" (Scale: 1-10; mild, moderate, severe)     Yes, bladder pressure 4. CAUSE: "What do you think is causing the symptoms?"     UTI 5. OTHER SYMPTOMS: "Do you have any other symptoms?" (e.g., fever, flank pain, blood in urine, pain with urination)     Frequency  Protocols used: Urinary Symptoms-A-AH

## 2021-03-10 NOTE — Telephone Encounter (Signed)
Patient called, left VM to return the call to the office to discuss symptoms with a nurse.   Summary: buring with urination   Patient called in has burning when urinating. Please call back on any suggestions. [id number] [address], [address]  Phone: [phone]  Fax: [phone]

## 2021-03-11 ENCOUNTER — Telehealth: Payer: Self-pay

## 2021-03-11 NOTE — Telephone Encounter (Signed)
Patient called asking for RF of Triamcinolone Cream (last prescribed about 6 years ago). Patient states eczema is flared. She was last seen 04/01/20. Do you want her to come in for appt or send in 1 RF?

## 2021-03-12 ENCOUNTER — Other Ambulatory Visit: Payer: Self-pay

## 2021-03-12 ENCOUNTER — Ambulatory Visit (INDEPENDENT_AMBULATORY_CARE_PROVIDER_SITE_OTHER): Payer: Medicare Other | Admitting: Internal Medicine

## 2021-03-12 ENCOUNTER — Encounter: Payer: Self-pay | Admitting: Internal Medicine

## 2021-03-12 VITALS — BP 125/83 | HR 76 | Temp 98.6°F | Ht 64.96 in | Wt 166.4 lb

## 2021-03-12 DIAGNOSIS — R350 Frequency of micturition: Secondary | ICD-10-CM | POA: Insufficient documentation

## 2021-03-12 DIAGNOSIS — B9689 Other specified bacterial agents as the cause of diseases classified elsewhere: Secondary | ICD-10-CM | POA: Diagnosis not present

## 2021-03-12 DIAGNOSIS — N898 Other specified noninflammatory disorders of vagina: Secondary | ICD-10-CM | POA: Insufficient documentation

## 2021-03-12 DIAGNOSIS — N760 Acute vaginitis: Secondary | ICD-10-CM | POA: Diagnosis not present

## 2021-03-12 LAB — MICROSCOPIC EXAMINATION

## 2021-03-12 LAB — URINALYSIS, ROUTINE W REFLEX MICROSCOPIC
Bilirubin, UA: NEGATIVE
Glucose, UA: NEGATIVE
Ketones, UA: NEGATIVE
Nitrite, UA: POSITIVE — AB
Specific Gravity, UA: 1.025 (ref 1.005–1.030)
Urobilinogen, Ur: 1 mg/dL (ref 0.2–1.0)
pH, UA: 6 (ref 5.0–7.5)

## 2021-03-12 LAB — WET PREP FOR TRICH, YEAST, CLUE
Clue Cell Exam: POSITIVE — AB
Trichomonas Exam: NEGATIVE
Yeast Exam: NEGATIVE

## 2021-03-12 MED ORDER — TRIAMCINOLONE ACETONIDE 0.1 % EX CREA: 1.0000 "application " | TOPICAL_CREAM | Freq: Two times a day (BID) | CUTANEOUS | 0 refills | Status: AC

## 2021-03-12 MED ORDER — METRONIDAZOLE 500 MG PO TABS: 500.0000 mg | ORAL_TABLET | Freq: Two times a day (BID) | ORAL | 0 refills | Status: AC

## 2021-03-12 MED ORDER — CEPHALEXIN 500 MG PO CAPS: 500.0000 mg | ORAL_CAPSULE | Freq: Two times a day (BID) | ORAL | 0 refills | Status: AC

## 2021-03-12 NOTE — Progress Notes (Signed)
BP 125/83    Pulse 76    Temp 98.6 F (37 C) (Oral)    Ht 5' 4.96" (1.65 m)    Wt 166 lb 6.4 oz (75.5 kg)    LMP 09/28/2008 (Approximate)    SpO2 98%    BMI 27.72 kg/m    Subjective:    Patient ID: [name], female    DOB: [birth year], 66 y.o.   MRN: [id number]  Chief Complaint  Patient presents with   Urinary Frequency    Started a few days ago, with pain, pressure, and odor.     HPI: EMMY KENG is a 66 y.o. female  Patient presents with: Urinary Frequency: Started a few days ago, with pain, pressure, and odor.  To have a total hysteretomy on march 14th  Has a vaginal odour - fishy per pt. Discharge noted is yellow coloured.   Urinary Frequency  This is a new (has a prolapsed uterus. says she couldnt go pee this morning at home, says she felt pain and pressure) problem. The current episode started in the past 7 days. The quality of the pain is described as burning. The pain is at a severity of 4/10 (hurts when she pees 4/10). The pain is mild. There has been no fever. Pertinent negatives include no chills, discharge, flank pain, frequency, hematuria, hesitancy, nausea, possible pregnancy, sweats, urgency or vomiting. Associated symptoms comments: Marzetta Merino and day before has had frequency and urgency . The treatment provided mild relief. Her past medical history is significant for recurrent UTIs. There is no history of urinary stasis.  Vaginal Discharge The patient's primary symptoms include genital itching, a genital odor and vaginal discharge. Pertinent negatives include no chills, flank pain, frequency, hematuria, nausea, urgency or vomiting.   Chief Complaint  Patient presents with   Urinary Frequency    Started a few days ago, with pain, pressure, and odor.     Relevant past medical, surgical, family and social history reviewed and updated as indicated. Interim medical history since our last visit reviewed. Allergies and medications reviewed and updated.  Review  of Systems  Constitutional:  Negative for chills.  Gastrointestinal:  Negative for nausea and vomiting.  Genitourinary:  Positive for vaginal discharge. Negative for flank pain, frequency, hematuria, hesitancy and urgency.   Per HPI unless specifically indicated above     Objective:    BP 125/83    Pulse 76    Temp 98.6 F (37 C) (Oral)    Ht 5' 4.96" (1.65 m)    Wt 166 lb 6.4 oz (75.5 kg)    LMP 09/28/2008 (Approximate)    SpO2 98%    BMI 27.72 kg/m   Wt Readings from Last 3 Encounters:  03/12/21 166 lb 6.4 oz (75.5 kg)  02/24/21 169 lb (76.7 kg)  01/12/21 169 lb 9.6 oz (76.9 kg)    Physical Exam Vitals and nursing note reviewed.  Constitutional:      General: She is not in acute distress.    Appearance: Normal appearance. She is not ill-appearing or diaphoretic.  Eyes:     Conjunctiva/sclera: Conjunctivae normal.  Pulmonary:     Breath sounds: No rhonchi.  Abdominal:     General: There is no distension.     Palpations: There is no mass.     Tenderness: There is no abdominal tenderness. There is no right CVA tenderness, left CVA tenderness or guarding.     Hernia: No hernia is present.  Skin:  Coloration: Skin is not jaundiced.     Findings: No erythema.  Neurological:     Mental Status: She is alert.    Results for orders placed or performed in visit on 02/24/21  POCT Urinalysis Dipstick  Result Value Ref Range   Color, UA yellow    Clarity, UA clear    Glucose, UA Negative Negative   Bilirubin, UA large    Ketones, UA neg    Spec Grav, UA 1.020 1.010 - 1.025   Blood, UA trace    pH, UA 6.0 5.0 - 8.0   Protein, UA Negative Negative   Urobilinogen, UA 0.2 0.2 or 1.0 E.U./dL   Nitrite, UA neg    Leukocytes, UA Negative Negative   Appearance normal    Odor normal         Current Outpatient Medications:    Acetaminophen-Codeine 300-30 MG tablet, Take 1 tablet by mouth daily as needed., Disp: , Rfl:    amLODipine (NORVASC) 5 MG tablet, Take 1 tablet (5 mg  total) by mouth daily., Disp: 90 tablet, Rfl: 1   calcium carbonate (OS-CAL - DOSED IN MG OF ELEMENTAL CALCIUM) 1250 (500 Ca) MG tablet, Take 1 tablet by mouth., Disp: , Rfl:    cholecalciferol (VITAMIN D) 1000 UNITS tablet, Take 1,000 Units by mouth daily., Disp: , Rfl:    cyclobenzaprine (FLEXERIL) 10 MG tablet, TAKE ONE TABLET BY MOUTH THREE TIMES A DAY FOR SPASMS, Disp: 270 tablet, Rfl: 0   DULoxetine (CYMBALTA) 60 MG capsule, Take 1 capsule (60 mg total) by mouth daily., Disp: 90 capsule, Rfl: 1   famotidine (PEPCID) 10 MG tablet, Take 10 mg by mouth at bedtime., Disp: , Rfl:    levothyroxine (SYNTHROID) 50 MCG tablet, TAKE 1 TABLET BY MOUTH EVERY MORNING 30 MINUTES BEFORE BREAKFAST, Disp: 90 tablet, Rfl: 3   Magnesium 400 MG TABS, Take 400 mg by mouth daily., Disp: , Rfl:    metoprolol succinate (TOPROL-XL) 50 MG 24 hr tablet, TAKE ONE TABLET BY MOUTH DAILY WITH OR IMMEDIATELY FOLLOWING A MEAL, Disp: 90 tablet, Rfl: 1   Multiple Vitamins-Minerals (CENTRUM SILVER ADULT 50+ PO), Take 1 tablet by mouth daily., Disp: , Rfl:    Potassium 99 MG TABS, Take 99 mg by mouth daily., Disp: , Rfl:    pregabalin (LYRICA) 100 MG capsule, Take 1 capsule (100 mg total) by mouth 2 (two) times daily. To take an additional 50mg  in the PM, Disp: 180 capsule, Rfl: 1   pregabalin (LYRICA) 50 MG capsule, Take 1 capsule (50 mg total) by mouth at bedtime. With the 100mg , Disp: 90 capsule, Rfl: 1   triamcinolone cream (KENALOG) 0.1 %, Apply 1 application topically 2 (two) times daily., Disp: 30 g, Rfl: 0   vitamin B-12 (CYANOCOBALAMIN) 500 MCG tablet, Take 500 mcg by mouth daily., Disp: , Rfl:    VITAMIN E PO, Take by mouth daily., Disp: , Rfl:    XIIDRA 5 % SOLN, , Disp: , Rfl:    zolpidem (AMBIEN) 10 MG tablet, Take 1 tablet (10 mg total) by mouth at bedtime., Disp: 30 tablet, Rfl: 5   etodolac (LODINE) 300 MG capsule, Take by mouth., Disp: , Rfl:    lidocaine (XYLOCAINE) 5 % ointment, Apply 1 application topically  3 (three) times daily as needed.  (Patient not taking: Reported on 03/12/2021), Disp: , Rfl: 1   meloxicam (MOBIC) 7.5 MG tablet, Take one/day; max take 2/day if necessary (Patient not taking: Reported on 03/12/2021), Disp: 180 tablet,  Rfl: 1   mometasone (ELOCON) 0.1 % cream, Apply to affected areas as directed 1-2 times per day as needed. (Patient not taking: Reported on 03/12/2021), Disp: 45 g, Rfl: 1   oxyCODONE-acetaminophen (PERCOCET) 5-325 MG tablet, Take 1 tablet by mouth every 4 (four) hours as needed for severe pain. (Patient not taking: Reported on 03/12/2021), Disp: 30 tablet, Rfl: 0   oxyCODONE-acetaminophen (PERCOCET) 5-325 MG tablet, Take 1 tablet by mouth every 4 (four) hours as needed for severe pain. (Patient not taking: Reported on 03/12/2021), Disp: 30 tablet, Rfl: 0   phenazopyridine (PYRIDIUM) 95 MG tablet, Take 1 tablet (95 mg total) by mouth once as needed for up to 1 dose for pain. (Patient not taking: Reported on 01/12/2021), Disp: 2 tablet, Rfl: 0    Assessment & Plan:  UTI check UA.  pt is currently symptomatic for an Urinary tract infection(abd pain, burning etc), will cover with emperic abx, see med module for details.  encouraged to increase water/fluid intake.Signs and symptoms of emergency were discussed with the patient. The risks, benefits and side effects of treatment were discussed with the patient. The patient verbalized an understanding of plan, and was told to call the clinic/go to the ED if symptoms worsen at any point of time.  BV will need to start pt on flagyl Clue cells noted on wet prep   Problem List Items Addressed This Visit   None Visit Diagnoses     Urine frequency    -  Primary   Relevant Orders   Urine Culture   Urinalysis, Routine w reflex microscopic   Vaginal odor       Relevant Orders   WET PREP FOR [place], YEAST, CLUE        Orders Placed This Encounter  Procedures   Urine Culture   WET PREP FOR [place], YEAST, CLUE   Urinalysis, Routine  w reflex microscopic     No orders of the defined types were placed in this encounter.    Follow up plan: No follow-ups on file.

## 2021-03-12 NOTE — Telephone Encounter (Signed)
RF sent in and patient advised of information per Dr. Nicole Kindred. aw

## 2021-03-15 LAB — URINE CULTURE

## 2021-03-16 NOTE — Progress Notes (Signed)
[facility] Return Visit  SUBJECTIVE  History of Present Illness: Diane Pearson is a 66 y.o. female seen in follow-up after urodynamic testing.   Urodynamic Impression:  1. Sensation was reduced; capacity was increased 2. Stress Incontinence was demonstrated at normal pressures; 3. Detrusor Overactivity was not demonstrated. 4. Emptying was dysfunctional with a elevated PVR (664ml), a sustained detrusor contraction not present,  abdominal straining present, normal urethral sphincter activity on EMG.    Past Medical History: Patient  has a past medical history of Arthritis, CAD (coronary artery disease), Depression, Depression, Family history of breast cancer, Fibromyalgia, Hypothyroidism, Insomnia, Menopause, Osteoporosis, and Tremors of nervous system.   Past Surgical History: She  has a past surgical history that includes varicose veins (1998); Colonoscopy with propofol (N/A, 09/29/2018); and Breast biopsy (Left, 04/20/2016).   Medications: She has a current medication list which includes the following prescription(s): acetaminophen-codeine, amlodipine, calcium carbonate, cephalexin, cholecalciferol, cyclobenzaprine, duloxetine, etodolac, famotidine, levothyroxine, lidocaine, magnesium, meloxicam, metoprolol succinate, metronidazole, mometasone, multiple vitamins-minerals, oxycodone-acetaminophen, oxycodone-acetaminophen, phenazopyridine, potassium, pregabalin, pregabalin, triamcinolone cream, vitamin b-12, vitamin e, xiidra, and zolpidem.   Allergies: Patient is allergic to bactrim [sulfamethoxazole-trimethoprim], erythromycin, gabapentin, tamiflu [oseltamivir phosphate], tramadol, nabumetone, and nitrofurantoin.   Social History: Patient  reports that she has been smoking cigarettes. She has a 45.00 pack-year smoking history. She has never used smokeless tobacco. She reports that she does not drink alcohol and does not use drugs.      OBJECTIVE     Physical  Exam: Vitals:   03/17/21 1412  BP: 135/85  Pulse: 80   Gen: No apparent distress, A&O x 3.  Detailed Urogynecologic Evaluation:  Prior exam showed:     POP-Q   1.5                                            Aa   1.5                                           Ba   -5                                              C    4                                            Gh   3                                            Pb   8                                            tvl    -2  Ap   -2                                            Bp   -5.5                                              D       Endometrial Biopsy:  In order to evaluate her bleeding issues, we discussed obtaining an endometrial biopsy. We reviewed the risks, benefits and alternatives. The risks include bleeding, infection, uterine perforation and the need for further evaluation or treatment.  She agreed to the procedure and signed the consent form. A time out was performed and documented.   The cervix was prepped with betadine. A ring forcep was applied to the anterior lip of the cervix.  The pipelle was placed into the uterine cavity without any difficulty to 7cm.  Two passes were performed and a minimal amount of tissue was obtained. The ring forcep was removed. The patient tolerated the procedure well.     ASSESSMENT AND PLAN    Diane Pearson is a 66 y.o. with:  1. Uterovaginal prolapse, incomplete   2. Prolapse of anterior vaginal wall   3. SUI (stress urinary incontinence, female)   4. Postmenopausal bleeding    - Although she did demonstrate leakage on urodynamics, she also had elevated PVR. Therefore, would not recommend concurrent incontinence procedure until incomplete emptying has resolved. She would be interested in an interval sling.   Plan for surgery: Exam under anesthesia, robotic assisted total laparoscopic hysterectomy, bilateral salpingo-oophorectomy,  sacrocolpopexy, cystoscopy  - We reviewed the patient's specific anatomic and functional findings, with the assistance of diagrams, and together finalized the above procedure. The planned surgical procedures were discussed along with the surgical risks outlined below, which were also provided on a detailed handout. Additional treatment options including expectant management, conservative management, medical management were discussed where appropriate.  We reviewed the benefits and risks of each treatment option.   General Surgical Risks: For all procedures, there are risks of bleeding, infection, damage to surrounding organs including but not limited to bowel, bladder, blood vessels, ureters and nerves, and need for further surgery if an injury were to occur. These risks are all low with minimally invasive surgery.   There are risks of numbness and weakness at any body site or buttock/rectal pain.  It is possible that baseline pain can be worsened by surgery, either with or without mesh. If surgery is vaginal, there is also a low risk of possible conversion to laparoscopy or open abdominal incision where indicated. Very rare risks include blood transfusion, blood clot, heart attack, pneumonia, or death.   There is also a risk of short-term postoperative urinary retention with need to use a catheter. About half of patients need to go home from surgery with a catheter, which is then later removed in the office. The risk of long-term need for a catheter is very low. There is also a risk of worsening of overactive bladder.    Prolapse (with or without mesh): Risk factors for surgical failure  include things that put pressure on your pelvis and the surgical repair, including obesity, chronic cough, and heavy lifting or straining (including lifting children or adults, straining  on the toilet, or lifting heavy objects such as furniture or anything weighing >25 lbs. Risks of recurrence is 20-30% with vaginal  native tissue repair and a less than 10% with sacrocolpopexy with mesh.    Sacrocolpopexy: Mesh implants may provide more prolapse support, but do have some unique risks to consider. It is important to understand that mesh is permanent and cannot be easily removed. Risks of abdominal sacrocolpopexy mesh include mesh exposure (~3-6%), painful intercourse (recent studies show lower rates after surgery compared to before, with ~5-8% risk of new onset), and very rare risks of bowel or bladder injury or infection (<1%). The risk of mesh exposure is more likely in a woman with risks for poor healing (prior radiation, poorly controlled diabetes, or immunocompromised). The risk of new or worsened chronic pain after mesh implant is more common in women with baseline chronic pain and/or poorly controlled anxiety or depression. There is an FDA safety notification on vaginal mesh procedures for prolapse but NOT abdominal mesh procedures and therefore does not apply to your surgery. We have extensive experience and training with mesh placement and we have close postoperative follow up to identify any potential complications from mesh.    - For preop Visit:  She is required to have a visit within 30 days of her surgery.   Today we reviewed pre-operative preparation, peri-operative expectations, and post-operative instructions/recovery.  She was provided with instructional handouts. She understands not to take aspirin (>81mg ) or NSAIDs 7 days prior to surgery. Prescriptions provided for: Oxycodone 5mg , Ibuprofen 600mg , Tylenol 500mg , Miralax. These prescriptions will be sent prior to surgery.  - Medical clearance: not required - Anticoagulant use: No - Medicaid Hysterectomy form: No - Accepts blood transfusion: Yes - Expected length of stay: outpatient  Request sent for surgery scheduling.   Jaquita Folds, MD   Time spent: I spent 30 minutes dedicated to the care of this patient on the date of this  encounter to include pre-visit review of records, face-to-face time with the patient discussing surgery and post visit documentation and ordering medication/ testing. Additional time was spent on the biopspy procedure.

## 2021-03-16 NOTE — Progress Notes (Signed)
?   How is she doing -- if not better can change abx to augmentin

## 2021-03-17 ENCOUNTER — Other Ambulatory Visit: Payer: Self-pay

## 2021-03-17 ENCOUNTER — Encounter: Payer: Self-pay | Admitting: Obstetrics and Gynecology

## 2021-03-17 ENCOUNTER — Other Ambulatory Visit (HOSPITAL_COMMUNITY)
Admission: RE | Admit: 2021-03-17 | Discharge: 2021-03-17 | Disposition: A | Discharge status: Home / Self Care | Payer: Medicare Other | Source: Ambulatory Visit | Attending: Obstetrics and Gynecology | Admitting: Obstetrics and Gynecology

## 2021-03-17 ENCOUNTER — Ambulatory Visit (INDEPENDENT_AMBULATORY_CARE_PROVIDER_SITE_OTHER): Payer: Medicare Other | Admitting: Obstetrics and Gynecology

## 2021-03-17 VITALS — BP 135/85 | HR 80

## 2021-03-17 DIAGNOSIS — N812 Incomplete uterovaginal prolapse: Secondary | ICD-10-CM

## 2021-03-17 DIAGNOSIS — N8110 Cystocele, unspecified: Secondary | ICD-10-CM | POA: Diagnosis not present

## 2021-03-17 DIAGNOSIS — N950 Postmenopausal bleeding: Secondary | ICD-10-CM | POA: Diagnosis not present

## 2021-03-17 DIAGNOSIS — N393 Stress incontinence (female) (male): Secondary | ICD-10-CM | POA: Diagnosis not present

## 2021-03-17 MED ORDER — POLYETHYLENE GLYCOL 3350 17 GM/SCOOP PO POWD: 17.0000 g | Freq: Every day | ORAL | 0 refills | Status: AC

## 2021-03-17 MED ORDER — OXYCODONE HCL 5 MG PO TABS: 5.0000 mg | ORAL_TABLET | ORAL | 0 refills | Status: DC | PRN

## 2021-03-17 MED ORDER — IBUPROFEN 600 MG PO TABS: 600.0000 mg | ORAL_TABLET | Freq: Four times a day (QID) | ORAL | 0 refills | Status: DC | PRN

## 2021-03-17 MED ORDER — ACETAMINOPHEN 500 MG PO TABS: 500.0000 mg | ORAL_TABLET | Freq: Four times a day (QID) | ORAL | 0 refills | Status: DC | PRN

## 2021-03-20 HISTORY — PX: ENDOMETRIAL BIOPSY: SHX622

## 2021-03-20 LAB — SURGICAL PATHOLOGY

## 2021-03-20 NOTE — Telephone Encounter (Signed)
error 

## 2021-03-27 ENCOUNTER — Encounter: Payer: Self-pay | Admitting: Family Medicine

## 2021-03-27 DIAGNOSIS — R42 Dizziness and giddiness: Secondary | ICD-10-CM

## 2021-03-27 DIAGNOSIS — M797 Fibromyalgia: Secondary | ICD-10-CM

## 2021-04-07 ENCOUNTER — Encounter: Payer: Self-pay | Admitting: Obstetrics and Gynecology

## 2021-04-07 ENCOUNTER — Other Ambulatory Visit: Payer: Self-pay

## 2021-04-07 ENCOUNTER — Ambulatory Visit (INDEPENDENT_AMBULATORY_CARE_PROVIDER_SITE_OTHER): Payer: Medicare Other | Admitting: Obstetrics and Gynecology

## 2021-04-07 VITALS — BP 126/83 | HR 81

## 2021-04-07 DIAGNOSIS — N8110 Cystocele, unspecified: Secondary | ICD-10-CM

## 2021-04-07 DIAGNOSIS — N812 Incomplete uterovaginal prolapse: Secondary | ICD-10-CM

## 2021-04-07 NOTE — Progress Notes (Signed)
[facility] Pre-Operative evaluation  Subjective Chief Complaint: Diane Pearson presents for a preoperative encounter.   History of Present Illness: CHASTA DESHPANDE is a 66 y.o. female who presents for preoperative visit.  She is scheduled to undergo Exam under anesthesia, robotic assisted total laparoscopic hysterectomy, bilateral salpingo-oophorectomy, sacrocolpopexy, cystoscopy on 04/21/21.  Her symptoms include vaginal bulge, and she was was found to have Stage III anterior, Stage I posterior, Stage I apical prolapse. EMB was performed for postmenopausal bleeding  ENDOMETRIUM, BIOPSY:  -  Fragments of weakly reactive endometrium.  -  Negative for hyperplasia, intraepithelial neoplasia (EIN) and  malignancy.   Urodynamics showed: 1. Sensation was reduced; capacity was increased 2. Stress Incontinence was demonstrated at normal pressures; 3. Detrusor Overactivity was not demonstrated. 4. Emptying was dysfunctional with a elevated PVR (652ml), a sustained detrusor contraction not present,  abdominal straining present, normal urethral sphincter activity on EMG.    Past Medical History:  Diagnosis Date   Arthritis    CAD (coronary artery disease)    Diagnosed on CT scan   Depression    Depression    Family history of breast cancer    Fibromyalgia    Hypothyroidism    Insomnia    Menopause    Osteoporosis    Tremors of nervous system      Past Surgical History:  Procedure Laterality Date   BREAST BIOPSY Left 04/20/2016   COLUMNAR CELL CHANGE   COLONOSCOPY WITH PROPOFOL N/A 09/29/2018   Procedure: COLONOSCOPY WITH PROPOFOL;  Surgeon: [name], MD;  Location: [facility] ENDOSCOPY;  Service: Endoscopy;  Laterality: N/A;   varicose veins  1998    is allergic to bactrim [sulfamethoxazole-trimethoprim], erythromycin, gabapentin, tamiflu [oseltamivir phosphate], tramadol, nabumetone, and nitrofurantoin.   Family History  Problem Relation Age of Onset    Cancer Father        Lung   Tuberculosis Father    Breast cancer Maternal Grandmother 19   Ulcerative colitis Sister    Anxiety disorder Daughter    Alzheimer's disease Maternal Grandfather    Heart disease Paternal Grandmother        MI   Heart disease Paternal Grandfather        MI   Breast cancer Maternal Aunt        70's    Social History   Tobacco Use   Smoking status: Every Day    Packs/day: 1.00    Years: 45.00    Pack years: 45.00    Types: Cigarettes   Smokeless tobacco: Never  Vaping Use   Vaping Use: Never used  Substance Use Topics   Alcohol use: No    Alcohol/week: 0.0 standard drinks   Drug use: No     Review of Systems was negative for a full 10 system review except as noted in the History of Present Illness.   Current Outpatient Medications:    acetaminophen (TYLENOL) 500 MG tablet, Take 1 tablet (500 mg total) by mouth every 6 (six) hours as needed (pain)., Disp: 30 tablet, Rfl: 0   Acetaminophen-Codeine 300-30 MG tablet, Take 1 tablet by mouth daily as needed., Disp: , Rfl:    amLODipine (NORVASC) 5 MG tablet, Take 1 tablet (5 mg total) by mouth daily., Disp: 90 tablet, Rfl: 1   calcium carbonate (OS-CAL - DOSED IN MG OF ELEMENTAL CALCIUM) 1250 (500 Ca) MG tablet, Take 1 tablet by mouth., Disp: , Rfl:    cholecalciferol (VITAMIN D) 1000 UNITS tablet, Take 1,000  Units by mouth daily., Disp: , Rfl:    cyclobenzaprine (FLEXERIL) 10 MG tablet, TAKE ONE TABLET BY MOUTH THREE TIMES A DAY FOR SPASMS, Disp: 270 tablet, Rfl: 0   DULoxetine (CYMBALTA) 60 MG capsule, Take 1 capsule (60 mg total) by mouth daily., Disp: 90 capsule, Rfl: 1   etodolac (LODINE) 300 MG capsule, Take by mouth., Disp: , Rfl:    famotidine (PEPCID) 10 MG tablet, Take 10 mg by mouth at bedtime., Disp: , Rfl:    ibuprofen (ADVIL) 600 MG tablet, Take 1 tablet (600 mg total) by mouth every 6 (six) hours as needed., Disp: 30 tablet, Rfl: 0   levothyroxine (SYNTHROID) 50 MCG tablet, TAKE 1  TABLET BY MOUTH EVERY MORNING 30 MINUTES BEFORE BREAKFAST, Disp: 90 tablet, Rfl: 3   lidocaine (XYLOCAINE) 5 % ointment, Apply 1 application topically 3 (three) times daily as needed., Disp: , Rfl: 1   Magnesium 400 MG TABS, Take 400 mg by mouth daily., Disp: , Rfl:    meloxicam (MOBIC) 7.5 MG tablet, Take one/day; max take 2/day if necessary, Disp: 180 tablet, Rfl: 1   metoprolol succinate (TOPROL-XL) 50 MG 24 hr tablet, TAKE ONE TABLET BY MOUTH DAILY WITH OR IMMEDIATELY FOLLOWING A MEAL, Disp: 90 tablet, Rfl: 1   mometasone (ELOCON) 0.1 % cream, Apply to affected areas as directed 1-2 times per day as needed., Disp: 45 g, Rfl: 1   Multiple Vitamins-Minerals (CENTRUM SILVER ADULT 50+ PO), Take 1 tablet by mouth daily., Disp: , Rfl:    oxyCODONE (OXY IR/ROXICODONE) 5 MG immediate release tablet, Take 1 tablet (5 mg total) by mouth every 4 (four) hours as needed for severe pain., Disp: 15 tablet, Rfl: 0   oxyCODONE-acetaminophen (PERCOCET) 5-325 MG tablet, Take 1 tablet by mouth every 4 (four) hours as needed for severe pain., Disp: 30 tablet, Rfl: 0   oxyCODONE-acetaminophen (PERCOCET) 5-325 MG tablet, Take 1 tablet by mouth every 4 (four) hours as needed for severe pain., Disp: 30 tablet, Rfl: 0   phenazopyridine (PYRIDIUM) 95 MG tablet, Take 1 tablet (95 mg total) by mouth once as needed for up to 1 dose for pain., Disp: 2 tablet, Rfl: 0   polyethylene glycol powder (GLYCOLAX/MIRALAX) 17 GM/SCOOP powder, Take 17 g by mouth daily. Drink 17g (1 scoop) dissolved in water per day., Disp: 255 g, Rfl: 0   Potassium 99 MG TABS, Take 99 mg by mouth daily., Disp: , Rfl:    pregabalin (LYRICA) 100 MG capsule, Take 1 capsule (100 mg total) by mouth 2 (two) times daily. To take an additional 50mg  in the PM, Disp: 180 capsule, Rfl: 1   pregabalin (LYRICA) 50 MG capsule, Take 1 capsule (50 mg total) by mouth at bedtime. With the 100mg , Disp: 90 capsule, Rfl: 1   triamcinolone cream (KENALOG) 0.1 %, Apply 1  application topically 2 (two) times daily., Disp: 30 g, Rfl: 0   vitamin B-12 (CYANOCOBALAMIN) 500 MCG tablet, Take 500 mcg by mouth daily., Disp: , Rfl:    VITAMIN E PO, Take by mouth daily., Disp: , Rfl:    XIIDRA 5 % SOLN, , Disp: , Rfl:    zolpidem (AMBIEN) 10 MG tablet, Take 1 tablet (10 mg total) by mouth at bedtime., Disp: 30 tablet, Rfl: 5   Objective Vitals:   04/07/21 1143  BP: 126/83  Pulse: 81    Gen: NAD CV: S1 S2 RRR Lungs: Clear to auscultation bilaterally Abd: soft, nontender   Previous Pelvic Exam showed:  POP-Q  1.5                                            Aa   1.5                                           Ba   -5                                              C    4                                            Gh   3                                            Pb   8                                            tvl    -2                                            Ap   -2                                            Bp   -5.5                                              D        Assessment/ Plan  Assessment: The patient is a 66 y.o. year old scheduled to undergo Exam under anesthesia, robotic assisted total laparoscopic hysterectomy, bilateral salpingo-oophorectomy, sacrocolpopexy, cystoscopy. Verbal consent was obtained for these procedures.  Plan: General Surgical Consent: The patient has previously been counseled on alternative treatments, and the decision by the patient and provider was to proceed with the procedure listed above.  For all procedures, there are risks of bleeding, infection, damage to surrounding organs including but not limited to bowel, bladder, blood vessels, ureters and nerves, and need for further surgery if an injury were to occur. These risks are all low with minimally invasive surgery.   There are risks of numbness and weakness at any body site or buttock/rectal pain.  It is possible that baseline pain can be worsened by  surgery, either with or without mesh. If surgery is vaginal, there is also a low risk of possible conversion to laparoscopy or open abdominal incision where indicated. Very rare  risks include blood transfusion, blood clot, heart attack, pneumonia, or death.   There is also a risk of short-term postoperative urinary retention with need to use a catheter. About half of patients need to go home from surgery with a catheter, which is then later removed in the office. The risk of long-term need for a catheter is very low. There is also a risk of worsening of overactive bladder.    Prolapse (with or without mesh): Risk factors for surgical failure  include things that put pressure on your pelvis and the surgical repair, including obesity, chronic cough, and heavy lifting or straining (including lifting children or adults, straining on the toilet, or lifting heavy objects such as furniture or anything weighing >25 lbs. Risks of recurrence is 20-30% with vaginal native tissue repair and a less than 10% with sacrocolpopexy with mesh.    Sacrocolpopexy: Mesh implants may provide more prolapse support, but do have some unique risks to consider. It is important to understand that mesh is permanent and cannot be easily removed. Risks of abdominal sacrocolpopexy mesh include mesh exposure (~3-6%), painful intercourse (recent studies show lower rates after surgery compared to before, with ~5-8% risk of new onset), and very rare risks of bowel or bladder injury or infection (<1%). The risk of mesh exposure is more likely in a woman with risks for poor healing (prior radiation, poorly controlled diabetes, or immunocompromised). The risk of new or worsened chronic pain after mesh implant is more common in women with baseline chronic pain and/or poorly controlled anxiety or depression. There is an FDA safety notification on vaginal mesh procedures for prolapse but NOT abdominal mesh procedures and therefore does not apply to  your surgery. We have extensive experience and training with mesh placement and we have close postoperative follow up to identify any potential complications from mesh.    We discussed consent for blood products. Risks for blood transfusion include allergic reactions, other reactions that can affect different body organs and managed accordingly, transmission of infectious diseases such as HIV or Hepatitis. However, the blood is screened. Patient consents for blood products.  Pre-operative instructions:  She was instructed to not take Aspirin/NSAIDs x 7days prior to surgery.  Antibiotic prophylaxis was ordered as indicated.  Cathter use: Patient will go home with foley if needed after post-operative voiding trial.  Post-operative instructions:  She was provided with specific post-operative instructions, including precautions and signs/symptoms for which we would recommend contacting us, in addition to daytime and after-hours contact phone numbers. This was provided on a handout.   Post-operative medications: Prescriptions for motrin, tylenol, miralax, and oxycodone were sent to her pharmacy. Discussed using ibuprofen and tylenol on a schedule to limit use of narcotics.   Laboratory testing:  We will check labs: type and screen    Preoperative clearance:  She does not require surgical clearance.    Post-operative follow-up:  A post-operative appointment will be made for 6 weeks from the date of surgery. If she needs a post-operative nurse visit for a voiding trial, that will be set up after she leaves the hospital.    Patient will call the clinic or use MyChart should anything change or any new issues arise.   Jaquita Folds, MD

## 2021-04-08 ENCOUNTER — Encounter: Payer: Self-pay | Admitting: Obstetrics and Gynecology

## 2021-04-08 NOTE — H&P (Signed)
[facility] ?Pre-Operative H&P ? ?Subjective ?Chief Complaint: Diane Pearson presents for a preoperative encounter.  ? ?History of Present Illness: ?Diane Pearson is a 66 y.o. female who presents for preoperative visit.  She is scheduled to undergo Exam under anesthesia, robotic assisted total laparoscopic hysterectomy, bilateral salpingo-oophorectomy, sacrocolpopexy, cystoscopy on 04/21/21.  Her symptoms include vaginal bulge, and she was was found to have Stage III anterior, Stage I posterior, Stage I apical prolapse. EMB was performed for postmenopausal bleeding ? ?ENDOMETRIUM, BIOPSY:  ?-  Fragments of weakly reactive endometrium.  ?-  Negative for hyperplasia, intraepithelial neoplasia (EIN) and  ?malignancy.  ? ?Urodynamics showed: ?1. Sensation was reduced; capacity was increased ?2. Stress Incontinence was demonstrated at normal pressures; ?3. Detrusor Overactivity was not demonstrated. ?4. Emptying was dysfunctional with a elevated PVR (629ml), a sustained detrusor contraction not present,  abdominal straining present, normal urethral sphincter activity on EMG. ?  ? ?Past Medical History:  ?Diagnosis Date  ? Arthritis   ? CAD (coronary artery disease)   ? Diagnosed on CT scan  ? Depression   ? Depression   ? Family history of breast cancer   ? Fibromyalgia   ? Hypothyroidism   ? Insomnia   ? Menopause   ? Osteoporosis   ? Tremors of nervous system   ?  ? ?Past Surgical History:  ?Procedure Laterality Date  ? BREAST BIOPSY Left 04/20/2016  ? COLUMNAR CELL CHANGE  ? COLONOSCOPY WITH PROPOFOL N/A 09/29/2018  ? Procedure: COLONOSCOPY WITH PROPOFOL;  Surgeon: [name], MD;  Location: [facility] ENDOSCOPY;  Service: Endoscopy;  Laterality: N/A;  ? varicose veins  1998  ? ? ?is allergic to bactrim [sulfamethoxazole-trimethoprim], erythromycin, gabapentin, tamiflu [oseltamivir phosphate], tramadol, nabumetone, and nitrofurantoin.  ? ?Family History  ?Problem Relation Age of Onset  ? Cancer  Father   ?     Lung  ? Tuberculosis Father   ? Breast cancer Maternal Grandmother 63  ? Ulcerative colitis Sister   ? Anxiety disorder Daughter   ? Alzheimer's disease Maternal Grandfather   ? Heart disease Paternal Grandmother   ?     MI  ? Heart disease Paternal Grandfather   ?     MI  ? Breast cancer Maternal Aunt   ?     71's  ? ? ?Social History  ? ?Tobacco Use  ? Smoking status: Every Day  ?  Packs/day: 1.00  ?  Years: 45.00  ?  Pack years: 45.00  ?  Types: Cigarettes  ? Smokeless tobacco: Never  ?Vaping Use  ? Vaping Use: Never used  ?Substance Use Topics  ? Alcohol use: No  ?  Alcohol/week: 0.0 standard drinks  ? Drug use: No  ? ? ? ?Review of Systems was negative for a full 10 system review except as noted in the History of Present Illness. ? ?No current facility-administered medications for this encounter. ? ?Current Outpatient Medications:  ?  acetaminophen (TYLENOL) 500 MG tablet, Take 1 tablet (500 mg total) by mouth every 6 (six) hours as needed (pain)., Disp: 30 tablet, Rfl: 0 ?  Acetaminophen-Codeine 300-30 MG tablet, Take 1 tablet by mouth daily as needed., Disp: , Rfl:  ?  amLODipine (NORVASC) 5 MG tablet, Take 1 tablet (5 mg total) by mouth daily., Disp: 90 tablet, Rfl: 1 ?  calcium carbonate (OS-CAL - DOSED IN MG OF ELEMENTAL CALCIUM) 1250 (500 Ca) MG tablet, Take 1 tablet by mouth., Disp: , Rfl:  ?  cholecalciferol (  VITAMIN D) 1000 UNITS tablet, Take 1,000 Units by mouth daily., Disp: , Rfl:  ?  cyclobenzaprine (FLEXERIL) 10 MG tablet, TAKE ONE TABLET BY MOUTH THREE TIMES A DAY FOR SPASMS, Disp: 270 tablet, Rfl: 0 ?  DULoxetine (CYMBALTA) 60 MG capsule, Take 1 capsule (60 mg total) by mouth daily., Disp: 90 capsule, Rfl: 1 ?  etodolac (LODINE) 300 MG capsule, Take by mouth., Disp: , Rfl:  ?  famotidine (PEPCID) 10 MG tablet, Take 10 mg by mouth at bedtime., Disp: , Rfl:  ?  ibuprofen (ADVIL) 600 MG tablet, Take 1 tablet (600 mg total) by mouth every 6 (six) hours as needed., Disp: 30 tablet,  Rfl: 0 ?  levothyroxine (SYNTHROID) 50 MCG tablet, TAKE 1 TABLET BY MOUTH EVERY MORNING 30 MINUTES BEFORE BREAKFAST, Disp: 90 tablet, Rfl: 3 ?  lidocaine (XYLOCAINE) 5 % ointment, Apply 1 application topically 3 (three) times daily as needed., Disp: , Rfl: 1 ?  Magnesium 400 MG TABS, Take 400 mg by mouth daily., Disp: , Rfl:  ?  metoprolol succinate (TOPROL-XL) 50 MG 24 hr tablet, TAKE ONE TABLET BY MOUTH DAILY WITH OR IMMEDIATELY FOLLOWING A MEAL, Disp: 90 tablet, Rfl: 1 ?  mometasone (ELOCON) 0.1 % cream, Apply to affected areas as directed 1-2 times per day as needed., Disp: 45 g, Rfl: 1 ?  Multiple Vitamins-Minerals (CENTRUM SILVER ADULT 50+ PO), Take 1 tablet by mouth daily., Disp: , Rfl:  ?  oxyCODONE (OXY IR/ROXICODONE) 5 MG immediate release tablet, Take 1 tablet (5 mg total) by mouth every 4 (four) hours as needed for severe pain., Disp: 15 tablet, Rfl: 0 ?  oxyCODONE-acetaminophen (PERCOCET) 5-325 MG tablet, Take 1 tablet by mouth every 4 (four) hours as needed for severe pain., Disp: 30 tablet, Rfl: 0 ?  oxyCODONE-acetaminophen (PERCOCET) 5-325 MG tablet, Take 1 tablet by mouth every 4 (four) hours as needed for severe pain., Disp: 30 tablet, Rfl: 0 ?  phenazopyridine (PYRIDIUM) 95 MG tablet, Take 1 tablet (95 mg total) by mouth once as needed for up to 1 dose for pain., Disp: 2 tablet, Rfl: 0 ?  polyethylene glycol powder (GLYCOLAX/MIRALAX) 17 GM/SCOOP powder, Take 17 g by mouth daily. Drink 17g (1 scoop) dissolved in water per day., Disp: 255 g, Rfl: 0 ?  Potassium 99 MG TABS, Take 99 mg by mouth daily., Disp: , Rfl:  ?  pregabalin (LYRICA) 100 MG capsule, Take 1 capsule (100 mg total) by mouth 2 (two) times daily. To take an additional 50mg  in the PM, Disp: 180 capsule, Rfl: 1 ?  pregabalin (LYRICA) 50 MG capsule, Take 1 capsule (50 mg total) by mouth at bedtime. With the 100mg , Disp: 90 capsule, Rfl: 1 ?  triamcinolone cream (KENALOG) 0.1 %, Apply 1 application topically 2 (two) times daily., Disp: 30  g, Rfl: 0 ?  vitamin B-12 (CYANOCOBALAMIN) 500 MCG tablet, Take 500 mcg by mouth daily., Disp: , Rfl:  ?  VITAMIN E PO, Take by mouth daily., Disp: , Rfl:  ?  XIIDRA 5 % SOLN, , Disp: , Rfl:  ?  zolpidem (AMBIEN) 10 MG tablet, Take 1 tablet (10 mg total) by mouth at bedtime., Disp: 30 tablet, Rfl: 5  ? ?Objective ?There were no vitals filed for this visit. ? ? ?Gen: NAD ?CV: S1 S2 RRR ?Lungs: Clear to auscultation bilaterally ?Abd: soft, nontender ? ? ?Previous Pelvic Exam showed: ? POP-Q ?  ?1.5  ?  Aa   ?1.5 ?                                          Ba   ?-5  ?                                            C  ?  ?4  ?                                          Gh   ?3  ?                                          Pb   ?8  ?                                          tvl  ?  ?-2  ?                                          Ap   ?-2  ?                                          Bp   ?-5.5  ?                                            D  ?  ? ? ? ? ?Assessment/ Plan ? ?Assessment: ?The patient is a 66 y.o. year old with pelvic organ prolapse scheduled to undergo Exam under anesthesia, robotic assisted total laparoscopic hysterectomy, bilateral salpingo-oophorectomy, sacrocolpopexy, cystoscopy.  ? ?Jaquita Folds, MD ? ? ? ?

## 2021-04-13 ENCOUNTER — Ambulatory Visit (INDEPENDENT_AMBULATORY_CARE_PROVIDER_SITE_OTHER): Payer: Medicare Other | Admitting: Family Medicine

## 2021-04-13 ENCOUNTER — Encounter: Payer: Self-pay | Admitting: Family Medicine

## 2021-04-13 ENCOUNTER — Other Ambulatory Visit: Payer: Self-pay

## 2021-04-13 VITALS — BP 106/69 | HR 78 | Temp 97.7°F | Wt 165.6 lb

## 2021-04-13 DIAGNOSIS — R251 Tremor, unspecified: Secondary | ICD-10-CM | POA: Diagnosis not present

## 2021-04-13 DIAGNOSIS — M797 Fibromyalgia: Secondary | ICD-10-CM

## 2021-04-13 NOTE — Progress Notes (Signed)
? ?BP 106/69   Pulse 78   Temp 97.7 ?F (36.5 ?C)   Wt 165 lb 9.6 oz (75.1 kg)   LMP 09/28/2008 (Approximate)   SpO2 99%   BMI 27.59 kg/m?   ? ?Subjective:  ? ? Patient ID: IMO Diane Pearson, female    DOB: May 09, 1955, 65 y.o.   MRN: [id number] ? ?HPI: ?Diane Pearson is a 66 y.o. female ? ?Chief Complaint  ?Patient presents with  ? Fibromyalgia  ? ?Tremor has been getting worse. Would like to see neurology. Her pain management tried to get her into see neurology at [facility], but she wanted to be seen closer to home.   ? ?FIBROMYALGIA ?Pain status: stable ?Satisfied with current treatment?: yes ?Medication side effects: no ?Medication compliance: excellent compliance ?Duration: chronic ?Location: widespread ?Quality: aching and sore ?Current pain level: moderate ?Previous pain level: moderate ?Aggravating factors: certain movements and activities ?Alleviating factors: medication, sleep ?Previous pain specialty evaluation: yes ?Non-narcotic analgesic meds: yes ?Narcotic contract:yes ?Treatments attempted: pain meds, lyrica  ? ?Relevant past medical, surgical, family and social history reviewed and updated as indicated. Interim medical history since our last visit reviewed. ?Allergies and medications reviewed and updated. ? ?Review of Systems  ?Constitutional: Negative.   ?Respiratory: Negative.    ?Cardiovascular: Negative.   ?Musculoskeletal:  Positive for back pain and myalgias. Negative for arthralgias, gait problem, joint swelling, neck pain and neck stiffness.  ?Neurological: Negative.   ?Psychiatric/Behavioral: Negative.    ? ?Per HPI unless specifically indicated above ? ?   ?Objective:  ?  ?BP 106/69   Pulse 78   Temp 97.7 ?F (36.5 ?C)   Wt 165 lb 9.6 oz (75.1 kg)   LMP 09/28/2008 (Approximate)   SpO2 99%   BMI 27.59 kg/m?   ?Wt Readings from Last 3 Encounters:  ?04/13/21 165 lb 9.6 oz (75.1 kg)  ?03/12/21 166 lb 6.4 oz (75.5 kg)  ?02/24/21 169 lb (76.7 kg)  ?  ?Physical Exam ?Vitals and nursing note  reviewed.  ?Constitutional:   ?   General: She is not in acute distress. ?   Appearance: Normal appearance. She is not ill-appearing, toxic-appearing or diaphoretic.  ?HENT:  ?   Head: Normocephalic and atraumatic.  ?   Right Ear: External ear normal.  ?   Left Ear: External ear normal.  ?   Nose: Nose normal.  ?   Mouth/Throat:  ?   Mouth: Mucous membranes are moist.  ?   Pharynx: Oropharynx is clear.  ?Eyes:  ?   General: No scleral icterus.    ?   Right eye: No discharge.     ?   Left eye: No discharge.  ?   Extraocular Movements: Extraocular movements intact.  ?   Conjunctiva/sclera: Conjunctivae normal.  ?   Pupils: Pupils are equal, round, and reactive to light.  ?Cardiovascular:  ?   Rate and Rhythm: Normal rate and regular rhythm.  ?   Pulses: Normal pulses.  ?   Heart sounds: Normal heart sounds. No murmur heard. ?  No friction rub. No gallop.  ?Pulmonary:  ?   Effort: Pulmonary effort is normal. No respiratory distress.  ?   Breath sounds: Normal breath sounds. No stridor. No wheezing, rhonchi or rales.  ?Chest:  ?   Chest wall: No tenderness.  ?Musculoskeletal:     ?   General: Normal range of motion.  ?   Cervical back: Normal range of motion and neck supple.  ?Skin: ?  General: Skin is warm and dry.  ?   Capillary Refill: Capillary refill takes less than 2 seconds.  ?   Coloration: Skin is not jaundiced or pale.  ?   Findings: No bruising, erythema, lesion or rash.  ?Neurological:  ?   General: No focal deficit present.  ?   Mental Status: She is alert and oriented to person, place, and time. Mental status is at baseline.  ?Psychiatric:     ?   Mood and Affect: Mood normal.     ?   Behavior: Behavior normal.     ?   Thought Content: Thought content normal.     ?   Judgment: Judgment normal.  ? ? ?Results for orders placed or performed in visit on 03/17/21  ?Surgical pathology  ?Result Value Ref Range  ? SURGICAL PATHOLOGY    ?  SURGICAL PATHOLOGY ?CASE: [phone] ?PATIENT: Diane Pearson ?Surgical  Pathology Report ? ? ? ? ?Clinical History: PMB (cm) ? ? ? ? ?FINAL MICROSCOPIC DIAGNOSIS: ? ?A. ENDOMETRIUM, BIOPSY: ?-  Fragments of weakly reactive endometrium. ?-  Negative for hyperplasia, intraepithelial neoplasia (EIN) and ?malignancy. ? ? ? ?GROSS DESCRIPTION: ? ?Received in formalin is a 0.5 x 0.3 x 0.2 cm aggregate of dark red soft ?tissue/material.  Submitted in 1 block. ? ?SW 03/19/2021 ? ? ?Final Diagnosis performed by Maryan Puls, MD.   Electronically signed ?03/20/2021 ?Technical and / or Professional components performed at [employer]. [facility], [place] [address] [place], [place] [address]. ? Immunohistochemistry Technical component (if applicable) was performed ?at [facility]. [address], [address], ?[place], [place] [address].   IMMUNOHISTOCHEMISTRY DISCLAIMER (if applicable): ?Some of these immunohistochemical stains may have been developed and the ?performance cha racteristics determine by [facility]. Some ?may not have been cleared or approved by the U.S. Food and Drug ?Administration. The FDA has determined that such clearance or approval ?is not necessary. This test is used for clinical purposes. It should not ?be regarded as investigational or for research. This laboratory is ?certified under the Clinical Laboratory Improvement Amendments of 1988 ?(CLIA-88) as qualified to perform high complexity clinical laboratory ?testing.  The controls stained appropriately. ?  ? ?   ?Assessment & Plan:  ? ?Problem List Items Addressed This Visit   ? ?  ? Other  ? Fibromyalgia  ?  Under good control on current regimen. Continue current regimen. Continue to monitor. Call with any concerns. Refills up to date. Continue to monitor.  ? ?  ?  ? ?Other Visit Diagnoses   ? ? Tremor    -  Primary  ? Getting worse. Referral to neurology placed today.  ? Relevant Orders  ? Ambulatory referral to Neurology  ? ?  ?  ? ?Follow up plan: ?Return in about 3 months (around  07/14/2021). ? ? ? ? ? ?

## 2021-04-13 NOTE — Assessment & Plan Note (Signed)
Under good control on current regimen. Continue current regimen. Continue to monitor. Call with any concerns. Refills up to date. Continue to monitor.  ? ?

## 2021-04-15 ENCOUNTER — Other Ambulatory Visit: Payer: Self-pay

## 2021-04-15 ENCOUNTER — Encounter (HOSPITAL_BASED_OUTPATIENT_CLINIC_OR_DEPARTMENT_OTHER): Payer: Self-pay | Admitting: Obstetrics and Gynecology

## 2021-04-15 DIAGNOSIS — Z01818 Encounter for other preprocedural examination: Secondary | ICD-10-CM | POA: Diagnosis not present

## 2021-04-16 NOTE — Progress Notes (Signed)
?PLEASE WEAR A MASK OUT IN PUBLIC AND SOCIAL DISTANCE AND [place] YOUR HANDS FREQUENTLY. PLEASE ASK ALL YOUR CLOSE HOUSEHOLD CONTACT TO WEAR MASK OUT IN PUBLIC AND SOCIAL DISTANCE AND [place] HANDS FREQUENTLY ALSO. ? ? ? ? ? Your procedure is scheduled on Tuesday, 04/21/21. ? Report to [facility] AT 9:00 am. ? ? Call this number if you have problems the morning of surgery  :[phone]. ? ? [facility].  WE ARE LOCATED IN [facility]. ? ?PLEASE BRING YOUR INSURANCE CARD AND PHOTO ID DAY OF SURGERY. ? ?ONLY ONE PERSON ALLOWED IN FACILITY WAITING AREA. ?                                   ? REMEMBER: ? DO NOT EAT FOOD, CANDY GUM OR MINTS  AFTER MIDNIGHT THE NIGHT BEFORE YOUR SURGERY . YOU MAY HAVE CLEAR LIQUIDS FROM MIDNIGHT THE NIGHT BEFORE YOUR SURGERY UNTIL  8:00 am. NO CLEAR LIQUIDS AFTER   8:00 am DAY OF SURGERY. ? ? YOU MAY  BRUSH YOUR TEETH MORNING OF SURGERY AND RINSE YOUR MOUTH OUT, NO CHEWING GUM CANDY OR MINTS. ? ?DO YOUR BEST NOT TO SMOKE FOR 24 HOURS BEFORE SURGERY. ? ? ? ?CLEAR LIQUID DIET ? ? ?Foods Allowed                                                                     Foods Excluded ? ?Coffee and tea, regular and decaf                             liquids that you cannot  ?Plain Jell-O any favor except red or purple                                           see through such as: ?Fruit ices (not with fruit pulp)                                     milk, soups, orange juice  ?Iced Popsicles                                    All solid food ?Carbonated beverages, regular and diet                                    ?Cranberry, grape and apple juices ?Sports drinks like Gatorade ? ?Sample Menu ?Breakfast                                Lunch  Supper ?Cranberry juice                                           ?Jell-O                                     Grape juice                           Apple juice ?Coffee or tea                         Jell-O                                      Popsicle ?                                               Coffee or tea                        Coffee or tea ? ?_____________________________________________________________________ ?  ? ? TAKE THESE MEDICATIONS MORNING OF SURGERY WITH A SIP OF WATER:  Pepcid, Levothyroxine, Metoprolol, Lyrica ? ?ONE VISITOR IS ALLOWED IN WAITING ROOM ONLY DAY OF SURGERY.  YOU MAY HAVE ANOTHER PERSON SWITCH OUT WITH THE  1  VISITOR IN THE WAITING ROOM DAY OF SURGERY AND A MASK MUST BE WORN IN THE WAITING ROOM.  ? ? 2 VISITORS  MAY VISIT IN THE EXTENDED RECOVERY ROOM UNTIL 800 PM ONLY 1 VISITOR AGE 55 AND OVER MAY SPEND THE NIGHT AND MUST BE IN EXTENDED RECOVERY ROOM NO LATER THAN 800 PM .  ? ? UP TO 2 CHILDREN AGE 79 TO 15 MAY ALSO VISIT IN EXTENDED RECOV ?ERY ROOM ONLY UNTIL 800 PM AND MUST LEAVE BY 800 PM.  ? ?ALL PERSONS VISITING IN EXTENDED RECOVERY ROOM MUST WEAR A MASK. ?                                   ?DO NOT WEAR JEWERLY, MAKE UP. ?DO NOT WEAR LOTIONS, POWDERS, PERFUMES OR NAIL POLISH ON YOUR FINGERNAILS. TOENAIL POLISH IS OK TO WEAR. ?DO NOT SHAVE FOR 48 HOURS PRIOR TO DAY OF SURGERY. ?MEN MAY SHAVE FACE AND NECK. ?CONTACTS, GLASSES, OR DENTURES MAY NOT BE WORN TO SURGERY. ?                                   ?[facility] IS NOT RESPONSIBLE  FOR ANY BELONGINGS.                                  ?                                  . ?  [place] - Preparing for Surgery ?Before surgery, you can play an important role.  Because skin is not sterile, your skin needs to be as free of germs as possible.  You can reduce the number of germs on your skin by washing with CHG (chlorahexidine gluconate) soap before surgery.  CHG is an antiseptic cleaner which kills germs and bonds with the skin to continue killing germs even after washing. ?Please DO NOT use if you have an allergy to CHG or antibacterial soaps.  If your skin becomes reddened/irritated stop using the  CHG and inform your nurse when you arrive at Short Stay. ?Do not shave (including legs and underarms) for at least 48 hours prior to the first CHG shower.  You may shave your face/neck. ?Please follow these instructions carefully: ? 1.  Shower with CHG Soap the night before surgery and the  morning of Surgery. ? 2.  If you choose to wash your hair, wash your hair first as usual with your  normal  shampoo. ? 3.  After you shampoo, rinse your hair and body thoroughly to remove the  shampoo.                            ?4.  Use CHG as you would any other liquid soap.  You can apply chg directly  to the skin and wash  ?                    Gently with a scrungie or clean washcloth. ? 5.  Apply the CHG Soap to your body ONLY FROM THE NECK DOWN.   Do not use on face/ open      ?                     Wound or open sores. Avoid contact with eyes, ears mouth and genitals (private parts).  ?                     Production manager,  Genitals (private parts) with your normal soap. ?            6.  Wash thoroughly, paying special attention to the area where your surgery  will be performed. ? 7.  Thoroughly rinse your body with warm water from the neck down. ? 8.  DO NOT shower/wash with your normal soap after using and rinsing off  the CHG Soap. ?               9.  Pat yourself dry with a clean towel. ?           10.  Wear clean pajamas. ?           11.  Place clean sheets on your bed the night of your first shower and do not  sleep with pets. ?Day of Surgery : ?Do not apply any lotions/deodorants the morning of surgery.  Please wear clean clothes to the hospital/surgery center. ? ?IF YOU HAVE ANY SKIN IRRITATION OR PROBLEMS WITH THE SURGICAL SOAP, PLEASE GET A BAR OF GOLD DIAL SOAP AND SHOWER THE NIGHT BEFORE YOUR SURGERY AND THE MORNING OF YOUR SURGERY. PLEASE LET THE NURSE KNOW MORNING OF YOUR SURGERY IF YOU HAD ANY PROBLEMS WITH THE SURGICAL SOAP. ? ? ?________________________________________________________________________                   ?                                    ?  QUESTIONS [place] [facility] PRE OP NURSE PHONE [phone]                                   ?

## 2021-04-16 NOTE — Progress Notes (Addendum)
Spoke w/ via phone for pre-op interview---Alegandra ?Lab needs dos---- Repeat EKG dos  due to artifact per Dr. Valma Cava, MDA             ?Lab results------04/17/21 lab appt for CBC, type & screen, BMP, EKG, 09/24/20 Chest CT in Epic ?COVID test -----patient states asymptomatic no test needed ?Arrive at -------0900 on Tuesday, 04/21/21 ?NPO after MN NO Solid Food.  Clear liquids from MN until---0800 ?Med rec completed ?Medications to take morning of surgery -----Pepcid, Synthroid, Metoprolol, Lyrica ?Diabetic medication -----n/a ?Patient instructed no nail polish to be worn day of surgery ?Patient instructed to bring photo id and insurance card day of surgery ?Patient aware to have Driver (ride ) / caregiver    for 24 hours after surgery - friend Caprice Renshaw ?Patient Special Instructions -----Try not to smoke for 24 hours before your surgery. Extended recovery instructions given. ?Pre-Op special Istructions -----Patient stated that she occasionally ambulates with a cane when her fibromyalgia is flared. Stated that no assistance was needed for transfers. ?Patient verbalized understanding of instructions that were given at this phone interview. ?Patient denies shortness of breath, chest pain, fever, cough at this phone interview.  ? ? ?Patient has primary tremors in her hands and neck. She takes Metoprolol for tremors, and follows with PCP Dr. Park Liter. Her LOV was 04/13/21 in Epic. She has been referred to see a neurologist, but does not have an appointment as of 04/15/21. ? ?Patient has fibromyalgia and myofascial pain. She follows with Pain Management at [facility], Dr. Barnet Glasgow. LOV 02/20/21 in [place]. ?

## 2021-04-17 ENCOUNTER — Encounter (HOSPITAL_COMMUNITY)
Admission: RE | Admit: 2021-04-17 | Discharge: 2021-04-17 | Disposition: A | Discharge status: Home / Self Care | Payer: Medicare Other | Source: Ambulatory Visit | Attending: Obstetrics and Gynecology | Admitting: Obstetrics and Gynecology

## 2021-04-17 ENCOUNTER — Other Ambulatory Visit: Payer: Self-pay

## 2021-04-17 DIAGNOSIS — Z01818 Encounter for other preprocedural examination: Secondary | ICD-10-CM | POA: Insufficient documentation

## 2021-04-17 LAB — CBC
HCT: 35.1 % — ABNORMAL LOW (ref 36.0–46.0)
Hemoglobin: 12.2 g/dL (ref 12.0–15.0)
MCH: 32.7 pg (ref 26.0–34.0)
MCHC: 34.8 g/dL (ref 30.0–36.0)
MCV: 94.1 fL (ref 80.0–100.0)
Platelets: 206 10*3/uL (ref 150–400)
RBC: 3.73 MIL/uL — ABNORMAL LOW (ref 3.87–5.11)
RDW: 12.9 % (ref 11.5–15.5)
WBC: 7.7 10*3/uL (ref 4.0–10.5)
nRBC: 0 % (ref 0.0–0.2)

## 2021-04-17 LAB — BASIC METABOLIC PANEL
Anion gap: 7 (ref 5–15)
BUN: 18 mg/dL (ref 8–23)
CO2: 28 mmol/L (ref 22–32)
Calcium: 9.5 mg/dL (ref 8.9–10.3)
Chloride: 104 mmol/L (ref 98–111)
Creatinine, Ser: 0.88 mg/dL (ref 0.44–1.00)
GFR, Estimated: >60 mL/min (ref >60)
Glucose, Bld: 84 mg/dL (ref 70–99)
Potassium: 3.8 mmol/L (ref 3.5–5.1)
Sodium: 139 mmol/L (ref 135–145)

## 2021-04-18 ENCOUNTER — Other Ambulatory Visit: Payer: Self-pay | Admitting: Family Medicine

## 2021-04-20 NOTE — Telephone Encounter (Signed)
Requested medications are due for refill today.  yes ? ?Requested medications are on the active medications list.  yes ? ?Last refill. 01/12/2021 #270 0 refills ? ?Future visit scheduled.   yes ? ?Notes to clinic.  Medication not delegated. On 3/8 it is note that pt is taking 1 per day. ? ? ? ?Requested Prescriptions  ?Pending Prescriptions Disp Refills  ? cyclobenzaprine (FLEXERIL) 10 MG tablet [Pharmacy Med Name: CYCLOBENZAPRINE '10MG'$  TABLETS] 270 tablet 0  ?  Sig: TAKE 1 TABLET BY MOUTH THREE TIMES DAILY FOR SPASMS  ?  ? Not Delegated - Analgesics:  Muscle Relaxants Failed - 04/18/2021  4:36 PM  ?  ?  Failed - This refill cannot be delegated  ?  ?  Passed - Valid encounter within last 6 months  ?  Recent Outpatient Visits   ? ?      ? 1 week ago Tremor  ? [place], Megan P, DO  ? 1 month ago Urine frequency  ? [facility] Vigg, Avanti, MD  ? 3 months ago Routine general medical examination at a health care facility  ? Dix, DO  ? 9 months ago Aortic atherosclerosis ([place])  ? [place], DO  ? 1 year ago Hypertension, unspecified type  ? [place], [place] P, DO  ? ?  ?  ?Future Appointments   ? ?        ? In 2 months Wynetta Emery, Barb Merino, DO [place]  ? In 6 months  [place]  ? ?  ? ?  ?  ?  ?  ?

## 2021-04-21 ENCOUNTER — Ambulatory Visit (HOSPITAL_BASED_OUTPATIENT_CLINIC_OR_DEPARTMENT_OTHER): Payer: Medicare Other | Admitting: Certified Registered Nurse Anesthetist

## 2021-04-21 ENCOUNTER — Other Ambulatory Visit: Payer: Self-pay

## 2021-04-21 ENCOUNTER — Encounter (HOSPITAL_BASED_OUTPATIENT_CLINIC_OR_DEPARTMENT_OTHER)
Admission: RE | Disposition: A | Discharge status: Home / Self Care | Payer: Self-pay | Source: Ambulatory Visit | Attending: Obstetrics and Gynecology

## 2021-04-21 ENCOUNTER — Ambulatory Visit: Payer: Self-pay

## 2021-04-21 ENCOUNTER — Encounter (HOSPITAL_BASED_OUTPATIENT_CLINIC_OR_DEPARTMENT_OTHER): Payer: Self-pay | Admitting: Obstetrics and Gynecology

## 2021-04-21 ENCOUNTER — Ambulatory Visit (HOSPITAL_BASED_OUTPATIENT_CLINIC_OR_DEPARTMENT_OTHER)
Admission: RE | Admit: 2021-04-21 | Discharge: 2021-04-21 | Disposition: A | Discharge status: Home / Self Care | Payer: Medicare Other | Source: Ambulatory Visit | Attending: Obstetrics and Gynecology | Admitting: Obstetrics and Gynecology

## 2021-04-21 DIAGNOSIS — N812 Incomplete uterovaginal prolapse: Secondary | ICD-10-CM | POA: Diagnosis present

## 2021-04-21 DIAGNOSIS — D259 Leiomyoma of uterus, unspecified: Secondary | ICD-10-CM | POA: Insufficient documentation

## 2021-04-21 DIAGNOSIS — F32A Depression, unspecified: Secondary | ICD-10-CM | POA: Diagnosis not present

## 2021-04-21 DIAGNOSIS — Z79899 Other long term (current) drug therapy: Secondary | ICD-10-CM | POA: Diagnosis not present

## 2021-04-21 DIAGNOSIS — E039 Hypothyroidism, unspecified: Secondary | ICD-10-CM | POA: Insufficient documentation

## 2021-04-21 DIAGNOSIS — F17210 Nicotine dependence, cigarettes, uncomplicated: Secondary | ICD-10-CM | POA: Diagnosis not present

## 2021-04-21 DIAGNOSIS — N9489 Other specified conditions associated with female genital organs and menstrual cycle: Secondary | ICD-10-CM

## 2021-04-21 DIAGNOSIS — N816 Rectocele: Secondary | ICD-10-CM | POA: Diagnosis not present

## 2021-04-21 DIAGNOSIS — Z01818 Encounter for other preprocedural examination: Secondary | ICD-10-CM

## 2021-04-21 DIAGNOSIS — Z79890 Hormone replacement therapy (postmenopausal): Secondary | ICD-10-CM | POA: Diagnosis not present

## 2021-04-21 DIAGNOSIS — N8111 Cystocele, midline: Secondary | ICD-10-CM

## 2021-04-21 DIAGNOSIS — I2510 Atherosclerotic heart disease of native coronary artery without angina pectoris: Secondary | ICD-10-CM | POA: Insufficient documentation

## 2021-04-21 DIAGNOSIS — N393 Stress incontinence (female) (male): Secondary | ICD-10-CM | POA: Diagnosis not present

## 2021-04-21 DIAGNOSIS — I10 Essential (primary) hypertension: Secondary | ICD-10-CM | POA: Insufficient documentation

## 2021-04-21 HISTORY — PX: XI ROBOTIC ASSISTED TOTAL HYSTERECTOMY WITH SACROCOLPOPEXY: SHX6825

## 2021-04-21 HISTORY — PX: CYSTOSCOPY: SHX5120

## 2021-04-21 HISTORY — DX: Other chronic pain: G89.29

## 2021-04-21 HISTORY — DX: Presence of spectacles and contact lenses: Z97.3

## 2021-04-21 HISTORY — DX: Essential (primary) hypertension: I10

## 2021-04-21 HISTORY — DX: Stress incontinence (female) (male): N39.3

## 2021-04-21 HISTORY — DX: Dermatitis, unspecified: L30.9

## 2021-04-21 HISTORY — DX: Diverticulosis of intestine, part unspecified, without perforation or abscess without bleeding: K57.90

## 2021-04-21 HISTORY — DX: Gastro-esophageal reflux disease without esophagitis: K21.9

## 2021-04-21 HISTORY — DX: Dependence on other enabling machines and devices: Z99.89

## 2021-04-21 LAB — TYPE AND SCREEN
ABO/RH(D): O NEG
Antibody Screen: NEGATIVE

## 2021-04-21 LAB — ABO/RH: ABO/RH(D): O NEG

## 2021-04-21 SURGERY — HYSTERECTOMY, TOTAL, ROBOT-ASSISTED, WITH SACROCOLPOPEXY
Anesthesia: General

## 2021-04-21 MED ORDER — DEXAMETHASONE SODIUM PHOSPHATE 10 MG/ML IJ SOLN
INTRAMUSCULAR | Status: DC | PRN
  Administered 2021-04-21: 10 mg via INTRAVENOUS

## 2021-04-21 MED ORDER — MEPERIDINE HCL 25 MG/ML IJ SOLN: 6.2500 mg | INTRAMUSCULAR | Status: DC | PRN

## 2021-04-21 MED ORDER — ACETAMINOPHEN 325 MG PO TABS: 650.0000 mg | ORAL_TABLET | ORAL | Status: DC | PRN

## 2021-04-21 MED ORDER — PHENAZOPYRIDINE HCL 100 MG PO TABS
200.0000 mg | ORAL_TABLET | ORAL | Status: AC
  Administered 2021-04-21: 200 mg via ORAL

## 2021-04-21 MED ORDER — HEPARIN SODIUM (PORCINE) 5000 UNIT/ML IJ SOLN
INTRAMUSCULAR | Status: AC
  Filled 2021-04-21: qty 1

## 2021-04-21 MED ORDER — POLYETHYLENE GLYCOL 3350 17 G PO PACK: 17.0000 g | PACK | Freq: Every day | ORAL | Status: DC

## 2021-04-21 MED ORDER — ALBUMIN HUMAN 5 % IV SOLN
INTRAVENOUS | Status: AC
  Filled 2021-04-21: qty 500

## 2021-04-21 MED ORDER — SUGAMMADEX SODIUM 200 MG/2ML IV SOLN
INTRAVENOUS | Status: DC | PRN
  Administered 2021-04-21: 200 mg via INTRAVENOUS

## 2021-04-21 MED ORDER — OXYCODONE HCL 5 MG/5ML PO SOLN: 5.0000 mg | Freq: Once | ORAL | Status: DC | PRN

## 2021-04-21 MED ORDER — PROPOFOL 10 MG/ML IV BOLUS
INTRAVENOUS | Status: DC | PRN
  Administered 2021-04-21: 30 mg via INTRAVENOUS
  Administered 2021-04-21: 150 mg via INTRAVENOUS

## 2021-04-21 MED ORDER — LIDOCAINE HCL (PF) 2 % IJ SOLN
INTRAMUSCULAR | Status: AC
  Filled 2021-04-21: qty 5

## 2021-04-21 MED ORDER — ONDANSETRON HCL 4 MG PO TABS: 4.0000 mg | ORAL_TABLET | Freq: Four times a day (QID) | ORAL | Status: DC | PRN

## 2021-04-21 MED ORDER — FENTANYL CITRATE (PF) 100 MCG/2ML IJ SOLN: 25.0000 ug | INTRAMUSCULAR | Status: DC | PRN

## 2021-04-21 MED ORDER — EPHEDRINE SULFATE-NACL 50-0.9 MG/10ML-% IV SOSY
PREFILLED_SYRINGE | INTRAVENOUS | Status: DC | PRN
  Administered 2021-04-21: 10 mg via INTRAVENOUS

## 2021-04-21 MED ORDER — GLYCOPYRROLATE PF 0.2 MG/ML IJ SOSY
PREFILLED_SYRINGE | INTRAMUSCULAR | Status: AC
  Filled 2021-04-21: qty 1

## 2021-04-21 MED ORDER — POVIDONE-IODINE 10 % EX SWAB
2.0000 | Freq: Once | CUTANEOUS | Status: DC
Start: 2021-04-21 — End: 2021-04-22

## 2021-04-21 MED ORDER — FENTANYL CITRATE (PF) 250 MCG/5ML IJ SOLN
INTRAMUSCULAR | Status: DC | PRN
Start: 2021-04-21 — End: 2021-04-21
  Administered 2021-04-21: 25 ug via INTRAVENOUS
  Administered 2021-04-21: 100 ug via INTRAVENOUS
  Administered 2021-04-21: 25 ug via INTRAVENOUS
  Administered 2021-04-21: 50 ug via INTRAVENOUS

## 2021-04-21 MED ORDER — ONDANSETRON HCL 4 MG/2ML IJ SOLN
INTRAMUSCULAR | Status: DC | PRN
  Administered 2021-04-21: 4 mg via INTRAVENOUS

## 2021-04-21 MED ORDER — LIDOCAINE 2% (20 MG/ML) 5 ML SYRINGE
INTRAMUSCULAR | Status: DC | PRN
Start: 2021-04-21 — End: 2021-04-21
  Administered 2021-04-21: 60 mg via INTRAVENOUS

## 2021-04-21 MED ORDER — ONDANSETRON HCL 4 MG/2ML IJ SOLN: 4.0000 mg | Freq: Four times a day (QID) | INTRAMUSCULAR | Status: DC | PRN

## 2021-04-21 MED ORDER — BUPIVACAINE HCL (PF) 0.25 % IJ SOLN
INTRAMUSCULAR | Status: DC | PRN
  Administered 2021-04-21: 7 mL

## 2021-04-21 MED ORDER — LIDOCAINE HCL (PF) 2 % IJ SOLN
INTRAMUSCULAR | Status: DC | PRN
  Administered 2021-04-21: 1.5 mg/kg/h via INTRADERMAL

## 2021-04-21 MED ORDER — EPHEDRINE 5 MG/ML INJ
INTRAVENOUS | Status: AC
  Filled 2021-04-21: qty 5

## 2021-04-21 MED ORDER — CEFAZOLIN SODIUM-DEXTROSE 2-4 GM/100ML-% IV SOLN
2.0000 g | INTRAVENOUS | Status: AC
  Administered 2021-04-21: 2 g via INTRAVENOUS

## 2021-04-21 MED ORDER — SIMETHICONE 80 MG PO CHEW: 80.0000 mg | CHEWABLE_TABLET | Freq: Four times a day (QID) | ORAL | Status: DC | PRN

## 2021-04-21 MED ORDER — ONDANSETRON HCL 4 MG/2ML IJ SOLN: 4.0000 mg | Freq: Once | INTRAMUSCULAR | Status: DC | PRN

## 2021-04-21 MED ORDER — SODIUM CHLORIDE 0.9 % IR SOLN
Status: DC | PRN
  Administered 2021-04-21: 1000 mL via INTRAVESICAL
  Administered 2021-04-21: 1000 mL

## 2021-04-21 MED ORDER — PROPOFOL 10 MG/ML IV BOLUS
INTRAVENOUS | Status: AC
  Filled 2021-04-21: qty 20

## 2021-04-21 MED ORDER — FENTANYL CITRATE (PF) 250 MCG/5ML IJ SOLN
INTRAMUSCULAR | Status: AC
  Filled 2021-04-21: qty 5

## 2021-04-21 MED ORDER — OXYCODONE HCL 5 MG PO TABS: 5.0000 mg | ORAL_TABLET | ORAL | Status: DC | PRN

## 2021-04-21 MED ORDER — ACETAMINOPHEN 325 MG PO TABS: 325.0000 mg | ORAL_TABLET | ORAL | Status: DC | PRN

## 2021-04-21 MED ORDER — ACETAMINOPHEN 10 MG/ML IV SOLN
INTRAVENOUS | Status: DC | PRN
  Administered 2021-04-21: 1000 mg via INTRAVENOUS

## 2021-04-21 MED ORDER — ACETAMINOPHEN 160 MG/5ML PO SOLN: 325.0000 mg | ORAL | Status: DC | PRN

## 2021-04-21 MED ORDER — ACETAMINOPHEN 10 MG/ML IV SOLN
INTRAVENOUS | Status: AC
  Filled 2021-04-21: qty 100

## 2021-04-21 MED ORDER — GLYCOPYRROLATE PF 0.2 MG/ML IJ SOSY
PREFILLED_SYRINGE | INTRAMUSCULAR | Status: DC | PRN
  Administered 2021-04-21: .2 mg via INTRAVENOUS

## 2021-04-21 MED ORDER — KETOROLAC TROMETHAMINE 30 MG/ML IJ SOLN
INTRAMUSCULAR | Status: AC
  Filled 2021-04-21: qty 1

## 2021-04-21 MED ORDER — ROCURONIUM BROMIDE 10 MG/ML (PF) SYRINGE
PREFILLED_SYRINGE | INTRAVENOUS | Status: DC | PRN
Start: 2021-04-21 — End: 2021-04-21
  Administered 2021-04-21: 40 mg via INTRAVENOUS
  Administered 2021-04-21: 30 mg via INTRAVENOUS

## 2021-04-21 MED ORDER — OXYCODONE HCL 5 MG PO TABS: 5.0000 mg | ORAL_TABLET | Freq: Once | ORAL | Status: DC | PRN

## 2021-04-21 MED ORDER — MIDAZOLAM HCL 2 MG/2ML IJ SOLN
INTRAMUSCULAR | Status: AC
  Filled 2021-04-21: qty 2

## 2021-04-21 MED ORDER — HEPARIN SODIUM (PORCINE) 5000 UNIT/ML IJ SOLN
5000.0000 [IU] | INTRAMUSCULAR | Status: AC
  Administered 2021-04-21: 5000 [IU] via SUBCUTANEOUS

## 2021-04-21 MED ORDER — KETOROLAC TROMETHAMINE 30 MG/ML IJ SOLN: 30.0000 mg | Freq: Once | INTRAMUSCULAR | Status: DC

## 2021-04-21 MED ORDER — LACTATED RINGERS IV SOLN
INTRAVENOUS | Status: DC
  Administered 2021-04-21: 1000 mL via INTRAVENOUS

## 2021-04-21 MED ORDER — CEFAZOLIN SODIUM-DEXTROSE 2-4 GM/100ML-% IV SOLN
INTRAVENOUS | Status: AC
  Filled 2021-04-21: qty 100

## 2021-04-21 MED ORDER — PHENAZOPYRIDINE HCL 100 MG PO TABS
ORAL_TABLET | ORAL | Status: AC
  Filled 2021-04-21: qty 2

## 2021-04-21 MED ORDER — MIDAZOLAM HCL 2 MG/2ML IJ SOLN
INTRAMUSCULAR | Status: DC | PRN
Start: 2021-04-21 — End: 2021-04-21
  Administered 2021-04-21 (×2): 1 mg via INTRAVENOUS

## 2021-04-21 SURGICAL SUPPLY — 87 items
ADH SKN CLS APL DERMABOND .7 (GAUZE/BANDAGES/DRESSINGS) ×2
AGENT HMST KT MTR STRL THRMB (HEMOSTASIS)
APL ESCP 34 STRL LF DISP (HEMOSTASIS)
APL PRP STRL LF DISP 70% ISPRP (MISCELLANEOUS) ×2
APPLICATOR SURGIFLO ENDO (HEMOSTASIS) IMPLANT
BLADE CLIPPER SENSICLIP SURGIC (BLADE) ×2 IMPLANT
BLADE SURG 15 STRL LF DISP TIS (BLADE) ×2 IMPLANT
BLADE SURG 15 STRL SS (BLADE) ×3
CATH FOLEY 3WAY  5CC 16FR (CATHETERS)
CATH FOLEY 3WAY 5CC 16FR (CATHETERS) ×2 IMPLANT
CHLORAPREP W/TINT 26 (MISCELLANEOUS) ×3 IMPLANT
COVER BACK TABLE 60X90IN (DRAPES) ×3 IMPLANT
COVER TIP SHEARS 8 DVNC (MISCELLANEOUS) ×2 IMPLANT
COVER TIP SHEARS 8MM DA VINCI (MISCELLANEOUS) ×1
DEFOGGER SCOPE WARMER CLEARIFY (MISCELLANEOUS) ×3 IMPLANT
DERMABOND ADVANCED (GAUZE/BANDAGES/DRESSINGS) ×1
DERMABOND ADVANCED .7 DNX12 (GAUZE/BANDAGES/DRESSINGS) ×2 IMPLANT
DEVICE CAPIO SLIM SINGLE (INSTRUMENTS) IMPLANT
DRAPE ARM DVNC X/XI (DISPOSABLE) ×8 IMPLANT
DRAPE COLUMN DVNC XI (DISPOSABLE) ×2 IMPLANT
DRAPE DA VINCI XI ARM (DISPOSABLE) ×4
DRAPE DA VINCI XI COLUMN (DISPOSABLE) ×1
DRAPE SHEET LG 3/4 BI-LAMINATE (DRAPES) ×2 IMPLANT
DRAPE UTILITY XL STRL (DRAPES) ×3 IMPLANT
ELECT REM PT RETURN 9FT ADLT (ELECTROSURGICAL) ×3
ELECTRODE REM PT RTRN 9FT ADLT (ELECTROSURGICAL) ×2 IMPLANT
GAUZE 4X4 16PLY ~~LOC~~+RFID DBL (SPONGE) ×6 IMPLANT
GLOVE SURG ENC MOIS LTX SZ6 (GLOVE) ×12 IMPLANT
GLOVE SURG UNDER POLY LF SZ6.5 (GLOVE) ×15 IMPLANT
GOWN STRL REUS W/TWL LRG LVL3 (GOWN DISPOSABLE) ×3 IMPLANT
HIBICLENS CHG 4% 4OZ BTL (MISCELLANEOUS) ×3 IMPLANT
HOLDER FOLEY CATH W/STRAP (MISCELLANEOUS) ×3 IMPLANT
IRRIG SUCT STRYKERFLOW 2 WTIP (MISCELLANEOUS) ×3
IRRIGATION SUCT STRKRFLW 2 WTP (MISCELLANEOUS) ×2 IMPLANT
KIT TURNOVER CYSTO (KITS) ×3 IMPLANT
LEGGING LITHOTOMY PAIR STRL (DRAPES) ×3 IMPLANT
MANIFOLD NEPTUNE II (INSTRUMENTS) ×3 IMPLANT
MANIPULATOR ADVINCU DEL 2.5 PL (MISCELLANEOUS) IMPLANT
MANIPULATOR ADVINCU DEL 3.0 PL (MISCELLANEOUS) IMPLANT
MANIPULATOR ADVINCU DEL 3.5 PL (MISCELLANEOUS) IMPLANT
MANIPULATOR ADVINCU DEL 4.0 PL (MISCELLANEOUS) IMPLANT
MESH VERTESSA LITE -Y 2X4X3 (Mesh General) ×3 IMPLANT
NEEDLE HYPO 22GX1.5 SAFETY (NEEDLE) ×3 IMPLANT
NEEDLE INSUFFLATION 120MM (ENDOMECHANICALS) ×3 IMPLANT
NS IRRIG 1000ML POUR BTL (IV SOLUTION) ×2 IMPLANT
OBTURATOR OPTICAL STANDARD 8MM (TROCAR) ×1
OBTURATOR OPTICAL STND 8 DVNC (TROCAR) ×2
OBTURATOR OPTICALSTD 8 DVNC (TROCAR) ×2 IMPLANT
OCCLUDER COLPOPNEUMO (BALLOONS) ×1 IMPLANT
PACK CYSTO (CUSTOM PROCEDURE TRAY) ×2 IMPLANT
PACK ROBOT WH (CUSTOM PROCEDURE TRAY) ×3 IMPLANT
PACK ROBOTIC GOWN (GOWN DISPOSABLE) ×3 IMPLANT
PACK VAGINAL WOMENS (CUSTOM PROCEDURE TRAY) ×2 IMPLANT
PAD OB MATERNITY 4.3X12.25 (PERSONAL CARE ITEMS) ×3 IMPLANT
PAD POSITIONING PINK XL (MISCELLANEOUS) ×3 IMPLANT
PAD PREP 24X48 CUFFED NSTRL (MISCELLANEOUS) ×3 IMPLANT
POUCH LAPAROSCOPIC INSTRUMENT (MISCELLANEOUS) ×1 IMPLANT
PROTECTOR NERVE ULNAR (MISCELLANEOUS) ×2 IMPLANT
RETRACTOR LONE STAR DISPOSABLE (INSTRUMENTS) ×2 IMPLANT
RETRACTOR STAY HOOK 5MM (MISCELLANEOUS) ×2 IMPLANT
SEAL CANN UNIV 5-8 DVNC XI (MISCELLANEOUS) ×8 IMPLANT
SEAL XI 5MM-8MM UNIVERSAL (MISCELLANEOUS) ×5
SET IRRIG Y TYPE TUR BLADDER L (SET/KITS/TRAYS/PACK) ×3 IMPLANT
SET TUBE SMOKE EVAC HIGH FLOW (TUBING) ×3 IMPLANT
SPONGE T-LAP 4X18 ~~LOC~~+RFID (SPONGE) IMPLANT
SUCTION FRAZIER HANDLE 10FR (MISCELLANEOUS)
SUCTION TUBE FRAZIER 10FR DISP (MISCELLANEOUS) ×2 IMPLANT
SURGIFLO W/THROMBIN 8M KIT (HEMOSTASIS) IMPLANT
SUT ABS MONO DBL WITH NDL 48IN (SUTURE) IMPLANT
SUT DVC VLOC 180 2-0 12IN GS21 (SUTURE) ×6
SUT GORETEX NAB #0 THX26 36IN (SUTURE) ×1 IMPLANT
SUT MNCRL AB 4-0 PS2 18 (SUTURE) ×3 IMPLANT
SUT MON AB 2-0 SH 27 (SUTURE) ×3 IMPLANT
SUT VIC AB 0 CT1 27 (SUTURE)
SUT VIC AB 0 CT1 27XBRD ANTBC (SUTURE) IMPLANT
SUT VIC AB 2-0 SH 27 (SUTURE) ×3
SUT VIC AB 2-0 SH 27XBRD (SUTURE) ×2 IMPLANT
SUT VICRYL 2-0 SH 8X27 (SUTURE) ×1 IMPLANT
SUT VLOC 180 0 9IN  GS21 (SUTURE) ×1
SUT VLOC 180 0 9IN GS21 (SUTURE) IMPLANT
SUT VLOC 180 2-0 9IN GS21 (SUTURE) ×6 IMPLANT
SUTURE DVC VL 180 2-0 12INGS21 (SUTURE) IMPLANT
SYR BULB EAR ULCER 3OZ GRN STR (SYRINGE) ×3 IMPLANT
TIP UTERINE 5.1X6CM LAV DISP (MISCELLANEOUS) ×1 IMPLANT
TOWEL OR 17X26 10 PK STRL BLUE (TOWEL DISPOSABLE) ×3 IMPLANT
TRAY FOLEY W/BAG SLVR 14FR (SET/KITS/TRAYS/PACK) ×2 IMPLANT
TROCAR XCEL NON BLADE 8MM B8LT (ENDOMECHANICALS) ×3 IMPLANT

## 2021-04-21 NOTE — Anesthesia Postprocedure Evaluation (Signed)
Anesthesia Post Note ? ?Patient: Diane Pearson ? ?Procedure(s) Performed: XI ROBOTIC ASSISTED TOTAL HYSTERECTOMY WITH BSO and SACROCOLPOPEXY (Bilateral) ?CYSTOSCOPY ? ?  ? ?Patient location during evaluation: PACU ?Anesthesia Type: General ?Level of consciousness: awake ?Pain management: pain level controlled ?Respiratory status: spontaneous breathing ?Cardiovascular status: stable ?Postop Assessment: no apparent nausea or vomiting ?Anesthetic complications: no ? ? ?No notable events documented. ? ?Last Vitals:  ?Vitals:  ? 04/21/21 1545 04/21/21 1600  ?BP: 129/78 127/73  ?Pulse: (!) 25 73  ?Resp: (!) 21 15  ?Temp:    ?SpO2: (!) 85% 96%  ?  ?Last Pain:  ?Vitals:  ? 04/21/21 1545  ?TempSrc:   ?PainSc: 0-No pain  ? ? ?  ?  ?  ?  ?  ?  ? ?Laporcha Marchesi ? ? ? ? ?

## 2021-04-21 NOTE — Progress Notes (Signed)
Patient doing well she is ambulating about the room.  She has not required any PRN medications.  She is ready for discharge her ride will pick her up at the door.  No complaints at this time. ?

## 2021-04-21 NOTE — Telephone Encounter (Signed)
Left a message for patient to give our office a call back to clarify how many times to she was taking Flexeril. Advised patient to give our office a call back.  ?

## 2021-04-21 NOTE — Progress Notes (Signed)
Hung warm bag of LR ?

## 2021-04-21 NOTE — Telephone Encounter (Signed)
Pt returned our call. She currently takes "about 2" Flexeril a day. ?

## 2021-04-21 NOTE — Interval H&P Note (Signed)
History and Physical Interval Note: ? ?04/21/2021 ?11:17 AM ? ?Diane Pearson  has presented today for surgery, with the diagnosis of uterovaginal prolapse, incomplete; anterior vaginal prolapse; stress incontinence.  The various methods of treatment have been discussed with the patient and family. After consideration of risks, benefits and other options for treatment, the patient has consented to  Procedure(s): ?XI ROBOTIC ASSISTED TOTAL HYSTERECTOMY WITH BSA and SACROCOLPOPEXY (N/A) ?TRANSVAGINAL TAPE (TVT) PROCEDURE (N/A) ?CYSTOSCOPY (N/A) as a surgical intervention.  The patient's history has been reviewed, patient examined, no change in status, stable for surgery.  I have reviewed the patient's chart and labs.  Questions were answered to the patient's satisfaction.   ? ? ?Jaquita Folds ? ? ?

## 2021-04-21 NOTE — Discharge Instructions (Addendum)
POST OPERATIVE INSTRUCTIONS ? ?General Instructions ?Recovery (not bed rest) will last approximately 6 weeks ?Walking is encouraged, but refrain from strenuous exercise/ housework/ heavy lifting. ?No lifting >10lbs  ?Nothing in the vagina- NO intercourse, tampons or douching ?Bathing:  Do not submerge in water (NO swimming, bath, hot tub, etc) until after your postop visit. You can shower starting the day after surgery.  ?No driving until you are not taking narcotic pain medicine and until your pain is well enough controlled that you can slam on the breaks or make sudden movements if needed.  ? ?Taking your medications ?Please take your acetaminophen and ibuprofen on a schedule for the first 48 hours. Take 600mg ibuprofen, then take 500mg acetaminophen 3 hours later, then continue to alternate ibuprofen and acetaminophen. That way you are taking each type of medication every 6 hours. ?Take the prescribed narcotic (oxycodone, tramadol, etc) as needed, with a maximum being every 4 hours.  ?Take a stool softener daily to keep your stools soft and preventing you from straining. If you have diarrhea, you decrease your stool softener. This is explained more below. We have prescribed you Miralax. ? ?Reasons to Call the Nurse (see last page for phone numbers) ?Heavy Bleeding (changing your pad every 1-2 hours) ?Persistent nausea/vomiting ?Fever (100.4 degrees or more) ?Incision problems (pus or other fluid coming out, redness, warmth, increased pain) ? ?Things to Expect After Surgery ?Mild to Moderate pain is normal during the first day or two after surgery. If prescribed, take Ibuprofen or Tylenol first and use the stronger medicine for ?break-through? pain. You can overlap these medicines because they work differently.  ? ?Constipation  ? ?To Prevent Constipation:  Eat a well-balanced diet including protein, grains, fresh fruit and vegetables.  Drink plenty of fluids. Walk regularly.  Depending on specific instructions  from your physician: take Miralax daily and additionally you can add a stool softener (colace/ docusate) and fiber supplement. Continue as long as you're on pain medications.  ? ?To Treat Constipation:  If you do not have a bowel movement in 2 days after surgery, you can take 2 Tbs of Milk of Magnesia 1-2 times a day until you have a bowel movement. If diarrhea occurs, decrease the amount or stop the laxative. If no results with Milk of Magnesia, you can drink a bottle of magnesium citrate which you can purchase over the counter. ? ?Fatigue:  This is a normal response to surgery and will improve with time.  Plan frequent rest periods throughout the day. ? ?Gas Pain:  This is very common but can also be very painful! Drink warm liquids such as herbal teas, bouillon or soup. Walking will help you pass more gas.  Mylicon or Gas-X can be taken over the counter. ? ?Leaking Urine:  Varying amounts of leakage may occur after surgery.  This should improve with time. Your bladder needs at least 3 months to recover from surgery. If you leak after surgery, be sure to mention this to your doctor at your post-op visit. If you were taking medications for overactive bladder prior to surgery, be sure to restart the medications immediately after surgery. ? ?Incisions: If you have incisions on your abdomen, the skin glue will dissolve on its own over time. It is ok to gently rinse with soap and water over these incisions but do not scrub. ? ?Catheter ?Approximately 50% of patients are unable to urinate after surgery and need to go home with a catheter. This allows your bladder to   rest so it can return to full function. If you go home with a catheter, the office will call to set up a voiding trial a few days after surgery. For most patients, by this visit, they are able to urinate on their own. Long term catheter use is rare.  ? ?Return to Work ? ?As work demands and recovery times vary widely, it is hard to predict when you will want  to return to work. If you have a desk job with no strenuous physical activity, and if you would like to return sooner than generally recommended, discuss this with your provider or call our office.  ? ?Post op concerns ? ?For non-emergent issues, please call the Urogynecology Nurse. Please leave a message and someone will contact you within one business day.  You can also send a message through [facility].  ? ?AFTER HOURS (After 5:00 PM and on weekends):  For urgent matters that cannot wait until the next business day. Call our office [phone] and connect to the doctor on call.  ?Please reserve this for important issues. ? ? ?**FOR ANY TRUE EMERGENCY ISSUES CALL 911 OR GO TO THE NEAREST EMERGENCY ROOM.** Please inform our office or the doctor on call of any emergency.   ? ? ?APPOINTMENTS: Call [phone] ? ? ?Post Anesthesia Home Care Instructions ? ?Activity: ?Get plenty of rest for the remainder of the day. A responsible individual must stay with you for 24 hours following the procedure.  ?For the next 24 hours, DO NOT: ?-Drive a car ?-Paediatric nurse ?-Drink alcoholic beverages ?-Take any medication unless instructed by your physician ?-Make any legal decisions or sign important papers. ? ?Meals: ?Start with liquid foods such as gelatin or soup. Progress to regular foods as tolerated. Avoid greasy, spicy, heavy foods. If nausea and/or vomiting occur, drink only clear liquids until the nausea and/or vomiting subsides. Call your physician if vomiting continues. ? ?Special Instructions/Symptoms: ?Your throat may feel dry or sore from the anesthesia or the breathing tube placed in your throat during surgery. If this causes discomfort, gargle with warm salt water. The discomfort should disappear within 24 hours. ? ?DISCHARGE INSTRUCTIONS: Laparoscopy ? ?The following instructions have been prepared to help you care for yourself upon your return home today. ? ?Wound care: ? Do not get the incision wet for the  first 24 hours. The incision should be kept clean and dry. ? The Band-Aids or dressings may be removed the day after surgery. ? Should the incision become sore, red, and swollen after the first week, check with your doctor. ? ?Personal hygiene: ? Shower the day after your procedure. ? ?Activity and limitations: ? Do NOT drive or operate any equipment today. ? Do NOT lift anything more than 15 pounds for 2-3 weeks after surgery. ? Do NOT rest in bed all day. ? Walking is encouraged. Walk each day, starting slowly with 5-minute walks 3 or 4 times a day. Slowly increase the length of your walks. ? Walk up and down stairs slowly. ? Do NOT do strenuous activities, such as golfing, playing tennis, bowling, running, biking, weight lifting, gardening, mowing, or vacuuming for 2-4 weeks. Ask your doctor when it is okay to start. ? ?Diet: Eat a light meal as desired this evening. You may resume your usual diet tomorrow. ? ?Return to work: This is dependent on the type of work you do. For the most part you can return to a desk job within a week of surgery. If you are  more active at work, please discuss this with your doctor. ? ?What to expect after your surgery: You may have a slight burning sensation when you urinate on the first day. You may have a very small amount of blood in the urine. Expect to have a small amount of vaginal discharge/light bleeding for 1-2 weeks. It is not unusual to have abdominal soreness and bruising for up to 2 weeks. You may be tired and need more rest for about 1 week. You may experience shoulder pain for 24-72 hours. Lying flat in bed may relieve it. ? ?Call your doctor for any of the following: ? Develop a fever of 100.4 or greater ? Inability to urinate 6 hours after discharge from hospital ? Severe pain not relieved by pain medications ? Persistent of heavy bleeding at incision site ? Redness or swelling around incision site after a week ? Increasing nausea or vomiting ? ? ? ? ? ?

## 2021-04-21 NOTE — Anesthesia Preprocedure Evaluation (Signed)
Anesthesia Evaluation  ?Patient identified by MRN, date of birth, ID band ?Patient awake ? ? ? ?Reviewed: ?Allergy & Precautions, H&P , NPO status , Patient's Chart, lab work & pertinent test results, reviewed documented beta blocker date and time  ? ?Airway ?Mallampati: II ? ?TM Distance: >3 FB ?Neck ROM: full ? ? ? Dental ? ?(+) Teeth Intact ?  ?Pulmonary ?neg pulmonary ROS, neg shortness of breath, neg COPD, neg recent URI, Current Smoker,  ?  ?Pulmonary exam normal ? ? ? ? ? ? ? Cardiovascular ?hypertension, Pt. on medications and Pt. on home beta blockers ?(-) angina+ CAD  ?(-) Past MI and (-) Cardiac Stents Normal cardiovascular exam(-) dysrhythmias  ? ? ?  ?Neuro/Psych ?PSYCHIATRIC DISORDERS Depression negative neurological ROS ?   ? GI/Hepatic ?negative GI ROS, Neg liver ROS,   ?Endo/Other  ?Hypothyroidism  ? Renal/GU ?negative Renal ROS  ?negative genitourinary ?  ?Musculoskeletal ? ? Abdominal ?Normal abdominal exam  (+)   ?Peds ? Hematology ?negative hematology ROS ?(+)   ?Anesthesia Other Findings ?Past Medical History: ?No date: Arthritis ?No date: CAD (coronary artery disease) ?    Comment:  Diagnosed on CT scan ?No date: Depression ?No date: Depression ?No date: Hypothyroidism ?No date: Insomnia ?No date: Menopause ?No date: Osteoporosis ?No date: Tremors of nervous system ? ?Past Surgical History: ?04/20/2016: BREAST BIOPSY; Left ?    Comment:  COLUMNAR CELL CHANGE ?1998: varicose veins ? ? ? ? Reproductive/Obstetrics ?negative OB ROS ? ?  ? ? ? ? ? ? ? ? ? ? ? ? ? ?  ?  ? ? ? ? ? ? ? ? ?Anesthesia Physical ? ?Anesthesia Plan ? ?ASA: II ? ?Anesthesia Plan: General  ? ?Post-op Pain Management:   ? ?Induction: Intravenous ? ?PONV Risk Score and Plan: Propofol infusion and TIVA ? ?Airway Management Planned: Oral ETT ? ?Additional Equipment: None ? ?Intra-op Plan:  ? ?Post-operative Plan: Extubation in OR ? ?Informed Consent: I have reviewed the patients History and  Physical, chart, labs and discussed the procedure including the risks, benefits and alternatives for the proposed anesthesia with the patient or authorized representative who has indicated his/her understanding and acceptance.  ? ? ? ?Dental Advisory Given ? ?Plan Discussed with: CRNA ? ?Anesthesia Plan Comments:   ? ? ? ? ? ? ?Anesthesia Quick Evaluation ? ?

## 2021-04-21 NOTE — Progress Notes (Signed)
Information called to Dr Wannetta Sender about bladder trial.  She gave orders to send home with foley catheter.   ?

## 2021-04-21 NOTE — Progress Notes (Signed)
Patient taught by RN how to empty catheter. ?

## 2021-04-21 NOTE — Anesthesia Procedure Notes (Signed)
Procedure Name: Intubation ?Date/Time: 04/21/2021 12:14 PM ?Performed by: Clearnce Sorrel, CRNA ?Pre-anesthesia Checklist: Patient identified, Emergency Drugs available, Suction available and Patient being monitored ?Patient Re-evaluated:Patient Re-evaluated prior to induction ?Oxygen Delivery Method: Circle System Utilized ?Preoxygenation: Pre-oxygenation with 100% oxygen ?Induction Type: IV induction ?Ventilation: Mask ventilation without difficulty ?Laryngoscope Size: Mac and 3 ?Grade View: Grade I ?Tube type: Oral ?Number of attempts: 1 ?Airway Equipment and Method: Stylet and Bite block ?Placement Confirmation: ETT inserted through vocal cords under direct vision, positive ETCO2 and breath sounds checked- equal and bilateral ?Secured at: 22 cm ?Tube secured with: Tape ?Dental Injury: Teeth and Oropharynx as per pre-operative assessment  ? ? ? ? ?

## 2021-04-21 NOTE — Telephone Encounter (Signed)
Pt returned our  call regarding Flexeril .Pt takes Flexeril "about 2 times a day. ?

## 2021-04-21 NOTE — Transfer of Care (Signed)
Immediate Anesthesia Transfer of Care Note ? ?Patient: Diane Pearson ? ?Procedure(s) Performed: XI ROBOTIC ASSISTED TOTAL HYSTERECTOMY WITH BSO and SACROCOLPOPEXY (Bilateral) ?CYSTOSCOPY ? ?Patient Location: PACU ? ?Anesthesia Type:General ? ?Level of Consciousness: drowsy and responds to stimulation ? ?Airway & Oxygen Therapy: Patient Spontanous Breathing and Patient connected to face mask oxygen ? ?Post-op Assessment: Report given to RN and Post -op Vital signs reviewed and stable ? ?Post vital signs: Reviewed and stable ? ?Last Vitals:  ?Vitals Value Taken Time  ?BP 129/78 04/21/21 1520  ?Temp    ?Pulse 62 04/21/21 1526  ?Resp 13 04/21/21 1526  ?SpO2 97 % 04/21/21 1526  ?Vitals shown include unvalidated device data. ? ?Last Pain:  ?Vitals:  ? 04/21/21 0958  ?TempSrc: Oral  ?PainSc: 6   ?   ? ?Patients Stated Pain Goal: 8 (04/21/21 0865) ? ?Complications: No notable events documented. ?

## 2021-04-21 NOTE — Op Note (Signed)
Operative Note ? ?Preoperative Diagnosis: anterior vaginal prolapse, posterior vaginal prolapse, uterovaginal prolapse incomplete ? ?Postoperative Diagnosis: same ? ?Procedures performed:  ?Robotic assisted total laparoscopic hysterectomy with bilateral salpingo-oophorectomy, sacrocolpopexy, cystoscopy ? ?Implants:  ?Implant Name Type Inv. Item Serial No. Manufacturer Lot No. LRB No. Used Action  ?MESH Valli Glance [id number] - [id number] [place] [id number]  1 Implanted  ? ? ?Attending Surgeon: Sherlene Shams, MD ? ?Assistant Surgeon: Lyman Speller, MD ? ? ?Anesthesia: General endotracheal ? ?Findings: 1. Normal appearing uterus, cervix, bilateral fallopian tubes and ovaries.   ? 2. On vaginal exam, stage II pelvic organ prolapse ? 3. On cystoscopy, normal bladder and urethra without lesion or foreign body. Brisk bilateral ureteral efflux noted.  ? ?Specimens:  ?ID Type Source Tests Collected by Time Destination  ?1 : UTERUS CERVIX BILATERAL TUBES AND OVARIES Tissue PATH Soft tissue SURGICAL PATHOLOGY Jaquita Folds, MD 04/21/2021 1324   ? ? ?Estimated blood loss: 50 mL ? ?IV fluids: 700 mL ? ?Urine output: 400 mL ? ?Complications: none ? ?Procedure in Detail: ? ?After informed consent was obtained, the patient was taken to the operating room, where general anesthesia was induced and found to be adequate. She was placed in dorsolithotomy position in yellowfin stirrups. Her hips were noted not to be hyperflexed or hyperextended. Her arms were padded with gel pads and tucked to her sides. Her hands were surrounded by foam. A padded strap was placed across her chest with foam between the pad and her skin. She was noted to be appropriately positioned with all pressure points well padded and off tension. A tilt test showed no slippage. She was prepped and draped in the usual sterile fashion. A uterine manipulator was placed in the uterus after sounding to 6 cm,  an appropriately sized Koh ring was placed around the cervix, and a pneumo-occluder balloon was positioned in the vagina for later use.  A sterile Foley catheter was inserted.  ? ?0.25% plain Marcaine was injected in the supraumbilical  area and an 8 mm supraumbilical skin incision was made with the scalpel.  A Veress needle was inserted into the incision, CO2 insufflation was started, a low opening pressure was noted, and pneumoperitoneum was obtained. The Veress needle was removed and a 45m robotic trocar was placed. The robotic camera was inserted and intraperitoneal placement was confirmed. Survey of the abdomen and pelvis revealed the findings as noted above. The sacrum appeared to be free of any adhesive disease. After determining placement for the other ports, Local anesthetic was injected at each site and two 8 mm incisions were made for robotic ports at 10 cm lateral to and at the level of the umbilical port. Two additional 8 mm incisions were made 10 cm lateral to these and 30 degrees down followed by 8 mm robotic ports - the right side for an assistant port. All trocars were placed sequentially under direct visualization of the camera. The patient was placed in Trendelenburg. The robot was docked on the patient's left side. Monopolar endoshears were placed in the right arm, a [place] bipolar grasper was placed in the 2nd arm of the patient's left side, and a Tip up grasper was placed in the 3rd arm on the patient's left side.  ? ?Attention was then turned to the robotic hysterectomy and BSO.  The infundibulopelvic ligament was cauterized and transected. The right round ligament was grasped, cauterized, and transected with electrocautery. The ureter  was identified and was found to be well away from the planned site of incision. Using the monopolar scissors, a window was made on the posterior leaf of the broad ligament. The anterior and posterior leaves of the broad ligament were taken down with cautery  and sharp dissection. The uterine artery was skeletonized and the bladder flap was created on the right side with a combination of electrosurgery and sharp dissection. The KOH ring was identified. The right uterine artery was clamped, cauterized, and transected. In a similar fashion, the left side was taken down. Further sharp dissection with combination of cautery was performed to further develop the bladder flap. At this point, the KOH ring was completely hugging the cervix. The pneumo-occluder balloon in the vagina was inflated to maintain pneumoperitoneum. A colpotomy was performed with electrosurgical cutting current and the uterus and cervix were completely amputated from the vagina. The specimen was delivered through the vagina. The posterior portion of the vaginal cuff was then grasped and pulled up to maintain pneumoperitoneum. The pneumo-occluder balloon was then replaced in the vagina. The right hand instrument was changed to a suture-cut needle driver. The vaginal cuff was then closed using a 0 V-lock suture.   ? ?The right hand instrument was replaced with monopolar endoshears. Attention was then turned to the sacral promontory. The peritoneum overlying the sacral promontory was tented up, dissected sharply with monopolar scissors and electrosurgery using layer by layer technique. The peritoneal incision was extended down to the posterior cul-de-sac. This was performed with care to avoid the ureter on the right side and the sigmoid colon and its mesentary on the left side. With a lucite probe in the vagina, the posterior vaginal dissection was then performed with sharp dissection and electrosurgery in order to dissect the rectum away from the posterior vagina. The anterior vaginal dissection was then performed with sharp dissection and electrosurgery. Attention was then returned to the sacral promontory, which was palpated with an assistant grasper to confirm correct location.  The overlying areolar and  adipose tissue were taken down until the anterior longitudinal sacral ligament was identified. Small vessels were cauterized along the way to obtain excellent hemostasis. A "Y" mesh was then inserted into the abdomen after trimming to appropriate size. With the probe in the vagina, the anterior leaf of the Y mesh was affixed to the anterior portion of the vagina using a 2-0 v-lock suture in a spiral pattern to distribute the suture evenly across the surface of the anterior mesh leaf. In a similar fashion, the posterior leaf of the Y mesh was attached to the posterior surface of the vagina with 2-0 v-lock suture. The distal end of the mesh was then brought to overlie the sacrum. The correct amount of tension was determined in order to elevate the vagina, but not put the mesh under tension. The distal end of the mesh was then affixed to the anterior longitudinal sacral ligament using two interrupted transverse stitches of CV2 Gortex. The excess distal mesh was then cut and removed. The peritoneum was reapproximated over the mesh using 2-0 monocryl. The bladder flap was incorporated to completely retroperitonealize the mesh. All pedicles were carefully inspected and noted to be hemostatic as the CO2 gas was deflated. All instruments were removed from the patient's abdomen.  ? ?The Foley catheter was removed.  A 70-degree cystoscope was introduced, and 360-degree inspection revealed no injury, lesion or foreign body in the bladder. Brisk bilateral ureteral efflux was noted with the assistance of pyridium.  The bladder was drained and the cystoscope was removed.  The Foley catheter was replaced. ? ?The robot was undocked. The CO2 gas was removed and the ports were removed.  The skin incisions were closed with subcutaneous stitches of 4-0 Monocryl and covered with skin glue.  Sponge, lap, and needle counts were correct x 2. The patient tolerated the procedure well. She was awakened from anesthesia and transferred to the  recovery room in stable condition.  ? ?Dr Sabra Heck assisted due to the complexity of the case and another assistant was not available.  ? ?Jaquita Folds, MD ? ? ?

## 2021-04-22 ENCOUNTER — Encounter (HOSPITAL_BASED_OUTPATIENT_CLINIC_OR_DEPARTMENT_OTHER): Payer: Self-pay | Admitting: Obstetrics and Gynecology

## 2021-04-22 ENCOUNTER — Telehealth: Payer: Self-pay | Admitting: Obstetrics and Gynecology

## 2021-04-22 HISTORY — PX: ABDOMINAL SACROCOLPOPEXY: SHX1114

## 2021-04-22 HISTORY — PX: HX TLHBSO: 2100001343

## 2021-04-22 HISTORY — PX: HX HYSTERECTOMY: SHX81

## 2021-04-22 LAB — SURGICAL PATHOLOGY

## 2021-04-22 NOTE — Telephone Encounter (Signed)
Diane Pearson underwent Robotic assisted total laparoscopic hysterectomy with bilateral salpingo-oophorectomy, sacrocolpopexy, cystoscopy on 04/22/21.  ? ?She failed her voiding trial.  ?39m was backfilled into the bladder ?Voided 1057m ?PVR by bladder scan was 17584m ? ?She was discharged with a catheter. Please call her for a routine post op check and to schedule a voiding trial by Friday 04/24/21. Thanks! ? ?MicJaquita FoldsD ? ?

## 2021-04-24 ENCOUNTER — Ambulatory Visit (INDEPENDENT_AMBULATORY_CARE_PROVIDER_SITE_OTHER): Payer: Medicare Other

## 2021-04-24 ENCOUNTER — Other Ambulatory Visit: Payer: Self-pay

## 2021-04-24 VITALS — BP 131/87 | HR 69

## 2021-04-24 DIAGNOSIS — N8110 Cystocele, unspecified: Secondary | ICD-10-CM

## 2021-04-24 NOTE — Patient Instructions (Signed)
Keep scheduled Post op appointment. ?

## 2021-04-24 NOTE — Progress Notes (Signed)
Urogyn Nurse Voiding Trial Note ? ?[name] underwent XI ROBOTIC ASSISTED TOTAL HYSTERECTOMY WITH BSO and SACROCOLPOPEXY on 04/21/2021.  She presents today for a voiding trial .  ? ?Patient was identified with 2 indicators. 229m of NS was instilled into the bladder via the catheter. The catheter was removed and patient was instructed to void into the urinary hat. She voided 993m The post void residual measured by bladder scan was 7261mShe passed the voiding trial and a catheter was not replaced.  ? ?The patient received aftercare instructions and will follow up as scheduled.  ?  ?

## 2021-04-27 ENCOUNTER — Ambulatory Visit: Payer: Self-pay | Admitting: *Deleted

## 2021-04-27 ENCOUNTER — Telehealth: Payer: Self-pay

## 2021-04-27 ENCOUNTER — Telehealth: Payer: Self-pay | Admitting: Obstetrics and Gynecology

## 2021-04-27 DIAGNOSIS — L089 Local infection of the skin and subcutaneous tissue, unspecified: Secondary | ICD-10-CM | POA: Diagnosis not present

## 2021-04-27 NOTE — Telephone Encounter (Signed)
Had hysterectomy last week, feels like has boil on buttock. ? ? ?Chief Complaint: boil on buttock ?Symptoms: pain, redness, chills ?Frequency: constant ?Pertinent Negatives: Patient denies fever but did have chills ?Disposition: '[]'$ ED /'[x]'$ Urgent Care (no appt availability in office) / '[]'$ Appointment(In office/virtual)/ '[]'$  [facility]/ '[]'$ Home Care/ '[]'$ Refused Recommended Disposition /'[]'$ [facility]/ '[]'$  Follow-up with PCP ?Additional Notes: Pt one week post op really called to see if should go to UC or ED. [facility] did not have openings today. Pt encouraged to call her surgeon's office who performed her hysterectomy last week prior to going to UC. ? ? ?Reason for Disposition ? Black (necrotic) color or blisters develop in wound ? ?Answer Assessment - Initial Assessment Questions ?1. APPEARANCE of BOIL: "What does the boil look like?"  ?    Can not see but it is hart in center, feels warm ?2. LOCATION: "Where is the boil located?"  ?    buttock ?3. NUMBER: "How many boils are there?"  ?    one ?4. SIZE: "How big is the boil?" (e.g., inches, cm; compare to size of a coin or other object) ?    Size quarter or larger ?5. ONSET: "When did the boil start?" ?    Started yesterday ?6. PAIN: "Is there any pain?" If Yes, ask: "How bad is the pain?"   (Scale 1-10; or mild, moderate, severe) ?    4 ?7. FEVER: "Do you have a fever?" If Yes, ask: "What is it, how was it measured, and when did it start?"  ?    No, but did not think has fever ?8. SOURCE: "Have you been around anyone with boils or other Staph infections?" "Have you ever had boils before?" ?    No, but recent hysterictomy  ?9. OTHER SYMPTOMS: "Do you have any other symptoms?" (e.g., shaking chills, weakness, rash elsewhere on body) ?    Chills yesterday, not shaking ?10. PREGNANCY: "Is there any chance you are pregnant?" "When was your last menstrual period?" ?      no ? ?Protocols used: Boil (Skin Abscess)-A-AH ? ?

## 2021-04-27 NOTE — Telephone Encounter (Signed)
Diane Pearson is a 67 y.o. female called in re: an boil on her bottom to the left of her intergluteal cleft. Pt said she called her pcp but there was no opening.   ?Pt was advised to f/u with her pcp or go to urgent care. Pt agreed.  ?Pt said she was not sure if you would see Dr. Wannetta Sender or her PCP for this problem.  ?

## 2021-04-28 ENCOUNTER — Encounter: Payer: Self-pay | Admitting: *Deleted

## 2021-04-29 NOTE — Telephone Encounter (Signed)
Pt was called back and notified that Dr. Wannetta Sender said she can travel only if she does not drive. Pt verbalized understanding ?

## 2021-05-18 ENCOUNTER — Telehealth: Payer: Self-pay | Admitting: Obstetrics and Gynecology

## 2021-05-18 NOTE — Telephone Encounter (Signed)
Pt was contacted. Pt said she had a little brown discharge but nothing significant. Pt said it was having a little cramping not nothing that keeps her bent over. Pt was notified that, that was ok per Dr. Wannetta Sender. Pt said she is glad and has no additional questions  ?

## 2021-05-21 DIAGNOSIS — M797 Fibromyalgia: Secondary | ICD-10-CM | POA: Diagnosis not present

## 2021-05-21 DIAGNOSIS — M1990 Unspecified osteoarthritis, unspecified site: Secondary | ICD-10-CM | POA: Diagnosis not present

## 2021-05-21 DIAGNOSIS — G894 Chronic pain syndrome: Secondary | ICD-10-CM | POA: Diagnosis not present

## 2021-06-02 ENCOUNTER — Encounter: Payer: Self-pay | Admitting: Obstetrics and Gynecology

## 2021-06-02 ENCOUNTER — Ambulatory Visit (INDEPENDENT_AMBULATORY_CARE_PROVIDER_SITE_OTHER): Payer: Medicare Other | Admitting: Obstetrics and Gynecology

## 2021-06-02 VITALS — BP 131/82 | HR 73

## 2021-06-02 DIAGNOSIS — Z9889 Other specified postprocedural states: Secondary | ICD-10-CM

## 2021-06-02 DIAGNOSIS — N393 Stress incontinence (female) (male): Secondary | ICD-10-CM

## 2021-06-02 NOTE — Progress Notes (Signed)
[facility] ? ?Date of Visit: 06/02/2021 ? ?History of Present Illness: Ms. Crymes is a 66 y.o. female scheduled today for a post-operative visit.  ?? Surgery: s/p Robotic assisted total laparoscopic hysterectomy with bilateral salpingo-oophorectomy, sacrocolpopexy, cystoscopy on 04/22/21 ?? She did not pass her postoperative void trial. Had repeat voiding trial on 04/24/21 which she passed ?? Postoperative course has been uncomplicated.  ? ?Today she reports she notices some light pink and brown discharge ? ?UTI in the last 6 weeks? No  ?Pain? No  ?She has returned to her normal activity (except for postop restrictions) ?Vaginal bulge? No  ?Stress incontinence: Yes - large leakage ?Urgency/frequency: No  ?Urge incontinence:  occasional with waking up in morning with full bladder ?Voiding dysfunction: Yes  ?Bowel issues: No - has been taking miralax regularly. Also stopped meloxicam which was also constipating to her.  ? ?Subjective Success: Do you usually have a bulge or something falling out that you can see or feel in the vaginal area? No  ?Retreatment Success: Any retreatment with surgery or pessary for any compartment? No  ? ?Pathology results: UTERUS, CERVIX, BILATERAL FALLOPIAN TUBES AND OVARIES:  ?- Cervix  ?    Unremarkable.  ?    No dysplasia or malignancy.  ?- Endometrium  ?    Inactive.  ?    Two benign endometrial polyps.  ?    No hyperplasia or malignancy.  ?- Myometrium  ?    Leiomyomata.  ?    No evidence of malignancy.  ?- Bilateral ovaries  ?    Endosalpingosis.  ?    No evidence of malignancy.  ?- Right Fallopian tube  ?    Unremarkable.  ?    No endometriosis or malignancy.  ?- Left Fallopian tube  ?    Benign paratubal cyst.  ?    No endometriosis or malignancy.  ? ?Medications: She has a current medication list which includes the following prescription(s): acetaminophen, acetaminophen-codeine, amlodipine, calcium carbonate, cholecalciferol, cyclobenzaprine, duloxetine, etodolac,  famotidine, ibuprofen, levothyroxine, lidocaine, magnesium, metoprolol succinate, mometasone, multiple vitamins-minerals, oxycodone, phenazopyridine, polyethylene glycol powder, potassium, pregabalin, pregabalin, triamcinolone cream, vitamin b-12, vitamin e, xiidra, and zolpidem.  ? ?Allergies: Patient is allergic to bactrim [sulfamethoxazole-trimethoprim], erythromycin, gabapentin, levofloxacin, tamiflu [oseltamivir phosphate], tramadol, nabumetone, and nitrofurantoin.  ? ?Physical Exam: ?BP 131/82   Pulse 73   LMP 09/28/2008 (Approximate)   ?Abdomen: soft, non-tender, without masses or organomegaly ?Abdominal laparoscopic Incisions: healing well.  ?Pelvic Examination: Vagina: Incisions healing well. Sutures are present at the vaginal and there is some granulation tissue. Granulation tissue treated with silver nitrate. No tenderness along the anterior or posterior vagina. No apical tenderness. No pelvic masses. No visible or palpable mesh. ? ?POP-Q: ?POP-Q ? ?-3  ?                                          Aa   ?-3 ?                                          Ba  ?-8  ?  C  ? ?3  ?                                          Gh  ?3  ?                                          Pb  ?8  ?                                          tvl  ? ?-2  ?                                          Ap  ?-2  ?                                          Bp  ?   ?                                            D  ? ? ?--------------------------------------------------------- ? ?Assessment and Plan:  ?1. Post-operative state   ? ? ?- Pathology results were reviewed with the patient today and she verbalized understanding that the results were benign.  ?- Can resume regular activity including exercise and intercourse,  if desired.  ?- Discussed avoidance of heavy lifting and straining long term to reduce the risk of recurrence.  ?- For SUI, we reviewed the options of midurethral sling or urethral bulking.  Pre-operative urodynamics showed incomplete bladder emptying. Therefore, we will need to reassess if she is emptying better before proceeding with another procedure.  ? ?Follow up for urodynamic testing ? ?Jaquita Folds, MD ? ? ? ?

## 2021-06-17 DIAGNOSIS — G250 Essential tremor: Secondary | ICD-10-CM | POA: Diagnosis not present

## 2021-06-17 DIAGNOSIS — R202 Paresthesia of skin: Secondary | ICD-10-CM | POA: Diagnosis not present

## 2021-07-05 ENCOUNTER — Other Ambulatory Visit: Payer: Self-pay | Admitting: Family Medicine

## 2021-07-07 NOTE — Telephone Encounter (Signed)
Requested Prescriptions  Pending Prescriptions Disp Refills  . metoprolol succinate (TOPROL-XL) 50 MG 24 hr tablet [Pharmacy Med Name: METOPROLOL ER SUCCINATE '50MG'$  TABS] 90 tablet 1    Sig: TAKE 1 TABLET BY MOUTH DAILY WITH OR IMMEDIATELY FOLLOWING A MEAL     Cardiovascular:  Beta Blockers Passed - 07/05/2021  3:36 AM      Passed - Last BP in normal range    BP Readings from Last 1 Encounters:  06/02/21 131/82         Passed - Last Heart Rate in normal range    Pulse Readings from Last 1 Encounters:  06/02/21 73         Passed - Valid encounter within last 6 months    Recent Outpatient Visits          2 months ago Tremor   [place], Megan P, DO   3 months ago Urine frequency   [facility] V[facility], Avanti, MD   5 months ago Routine general medical examination at a health care facility   [facility], [place], DO   12 months ago Aortic atherosclerosis [facility])   [place], Megan P, DO   1 year ago Hypertension, unspecified type   [facility], Barb Merino, DO      Future Appointments            In 1 week Mecum, Dani Gobble, PA-C [employer], [facility]   In 2 weeks Wannetta Sender, Governor Rooks, MD Urogynecology at [facility], [facility]   In 3 months  [facility], [place]

## 2021-07-09 ENCOUNTER — Encounter: Payer: Self-pay | Admitting: Obstetrics and Gynecology

## 2021-07-09 ENCOUNTER — Ambulatory Visit (INDEPENDENT_AMBULATORY_CARE_PROVIDER_SITE_OTHER): Payer: Medicare Other | Admitting: Obstetrics and Gynecology

## 2021-07-09 ENCOUNTER — Other Ambulatory Visit (HOSPITAL_COMMUNITY)
Admission: RE | Admit: 2021-07-09 | Discharge: 2021-07-09 | Disposition: A | Discharge status: Home / Self Care | Payer: Medicare Other | Attending: Obstetrics and Gynecology | Admitting: Obstetrics and Gynecology

## 2021-07-09 VITALS — BP 142/89 | HR 73

## 2021-07-09 DIAGNOSIS — R82998 Other abnormal findings in urine: Secondary | ICD-10-CM | POA: Insufficient documentation

## 2021-07-09 DIAGNOSIS — R3915 Urgency of urination: Secondary | ICD-10-CM

## 2021-07-09 LAB — POCT URINALYSIS DIPSTICK
Blood, UA: NEGATIVE
Glucose, UA: NEGATIVE
Ketones, UA: NEGATIVE
Nitrite, UA: POSITIVE
Protein, UA: POSITIVE — AB
Spec Grav, UA: >=1.03 — AB (ref 1.010–1.025)
Urobilinogen, UA: 0.2 E.U./dL
pH, UA: 6.5 (ref 5.0–8.0)

## 2021-07-09 MED ORDER — CEPHALEXIN 250 MG PO CAPS: 250.0000 mg | ORAL_CAPSULE | Freq: Four times a day (QID) | ORAL | 0 refills | Status: AC

## 2021-07-09 NOTE — Progress Notes (Signed)
[facility] Return Visit  SUBJECTIVE  History of Present Illness: Diane Pearson is a 66 y.o. female presenting today for urodynamic testing.   Catheterized sample POC urine at the start of the test was positive for leukocytes, nitrites. We reviewed that she should be treated for UTI and then return next week for urodynamic testing.    Past Medical History: Patient  has a past medical history of Ambulates with cane, Arthritis, CAD (coronary artery disease), Chronic myofascial pain, Chronic pain of both knees, COVID (2021), Depression, Diverticulosis, Eczema, Family history of breast cancer, Fibromyalgia, GERD (gastroesophageal reflux disease), Hypertension, Hypothyroidism, Insomnia, Menopause, Osteoporosis, SUI (stress urinary incontinence, female), Tremors of nervous system, and Wears glasses.   Past Surgical History: She  has a past surgical history that includes varicose veins (1998); Colonoscopy with propofol (N/A, 09/29/2018); Breast biopsy (Left, 04/20/2016); Foot surgery (11/13/2020); Endometrial biopsy (03/20/2021); Refractive surgery; Xi robotic assisted total hysterectomy with sacrocolpopexy (Bilateral, 04/21/2021); and Cystoscopy (N/A, 04/21/2021).   Medications: She has a current medication list which includes the following prescription(s): cephalexin, acetaminophen, acetaminophen-codeine, amlodipine, calcium carbonate, cholecalciferol, cyclobenzaprine, duloxetine, etodolac, famotidine, ibuprofen, levothyroxine, lidocaine, magnesium, metoprolol succinate, mometasone, multiple vitamins-minerals, oxycodone, phenazopyridine, polyethylene glycol powder, potassium, pregabalin, pregabalin, triamcinolone cream, vitamin b-12, vitamin e, xiidra, and zolpidem.   Allergies: Patient is allergic to bactrim [sulfamethoxazole-trimethoprim], erythromycin, gabapentin, levofloxacin, tamiflu [oseltamivir phosphate], tramadol, nabumetone, and nitrofurantoin.   Social History: Patient   reports that she has been smoking cigarettes. She has a 45.00 pack-year smoking history. She has never used smokeless tobacco. She reports that she does not currently use alcohol. She reports that she does not use drugs.      OBJECTIVE     Physical Exam: Vitals:   07/09/21 0944  BP: (!) 142/89  Pulse: 73  SpO2: 97%   Gen: No apparent distress, A&O x 3.    ASSESSMENT AND PLAN    [name] is a 67 y.o. with:  1. Urinary urgency   2. Leukocytes in urine    - treat with keflex '250mg'$  QID x5 days - will send urine for culture - return for urodynamic testing  Jaquita Folds, MD

## 2021-07-11 LAB — URINE CULTURE: Culture: >=100000 — AB

## 2021-07-14 ENCOUNTER — Encounter: Payer: Self-pay | Admitting: Physician Assistant

## 2021-07-14 ENCOUNTER — Ambulatory Visit: Payer: Medicare Other | Admitting: Family Medicine

## 2021-07-14 ENCOUNTER — Ambulatory Visit (INDEPENDENT_AMBULATORY_CARE_PROVIDER_SITE_OTHER): Payer: Medicare Other | Admitting: Physician Assistant

## 2021-07-14 VITALS — BP 97/64 | HR 70 | Temp 98.1°F | Wt 161.4 lb

## 2021-07-14 DIAGNOSIS — M797 Fibromyalgia: Secondary | ICD-10-CM | POA: Diagnosis not present

## 2021-07-14 DIAGNOSIS — F5101 Primary insomnia: Secondary | ICD-10-CM | POA: Diagnosis not present

## 2021-07-14 NOTE — Progress Notes (Signed)
Established Patient Office Visit  Name: Diane Pearson   MRN: [id number]    DOB: 1955/11/10   Date:07/14/2021  Today's Provider: Talitha Givens, MHS, PA-C Introduced myself to the patient as a PA-C and provided education on APPs in clinical practice.         Subjective  Chief Complaint  Chief Complaint  Patient presents with   Fibromyalgia    HPI   Rainy damp day seems to exacerbate pain symptoms   FIBROMYALGIA Pain status: controlled Satisfied with current treatment?: yes Medication side effects: no Medication compliance: excellent compliance Duration: Chronic Location: elbows, wrists, knees, lower back, tailbone, shoulders, neck,  Quality: dull, aching, burning, and cramping Current pain level: 1/10 Previous pain level: 9/10 Aggravating factors: vacuuming seems to aggravate  Alleviating factors: rest, heat, NSAIDs, APAP, and narcotics Previous pain specialty evaluation: yes Non-narcotic analgesic meds: yes Narcotic contract:yes Treatments attempted: rest, ice, heat, APAP, ibuprofen, aleve, physical therapy, HEP, and OMM     Patient Active Problem List   Diagnosis Date Noted   Uterovaginal prolapse, incomplete 04/21/2021   Urine frequency 03/12/2021   Vaginal odor 03/12/2021   Bacterial vaginosis 03/12/2021   Aortic atherosclerosis ([place]) 05/06/2020   Hypertension 03/21/2020   Strain of knee 10/31/2018   Polyp of colon    Rectal polyp    Microscopic hematuria 01/15/2018   Hyperlipidemia 11/29/2017   Hypothyroid 05/30/2017   Advance care planning 05/30/2017   History of abnormal cervical Pap smear 03/03/2015   Neuropathy 09/25/2014   Benign essential tremor 05/20/2014   Recurrent major depression ([place]) 05/20/2014   Insomnia 05/20/2014   Osteoporosis 05/20/2014   Symptomatic menopausal or female climacteric states 05/20/2014   Fibromyalgia 05/20/2014   Atopic rhinitis 05/20/2014   Tobacco use 05/20/2014    Past Surgical History:  Procedure  Laterality Date   ABDOMINAL HYSTERECTOMY     BREAST BIOPSY Left 04/20/2016   COLUMNAR CELL CHANGE   COLONOSCOPY WITH PROPOFOL N/A 09/29/2018   Procedure: COLONOSCOPY WITH PROPOFOL;  Surgeon: [name], MD;  Location: [facility];  Service: Endoscopy;  Laterality: N/A;   CYSTOSCOPY N/A 04/21/2021   Procedure: CYSTOSCOPY;  Surgeon: Jaquita Folds, MD;  Location[facility];  Service: Gynecology;  Laterality: N/A;   ENDOMETRIAL BIOPSY  03/20/2021   FOOT SURGERY  11/13/2020   hammer toe repair of right second toe   REFRACTIVE SURGERY     laser eye surgery for retinal detachment, pt not sure which eye, around 2012 per pt   varicose veins  1998   XI ROBOTIC ASSISTED TOTAL HYSTERECTOMY WITH SACROCOLPOPEXY Bilateral 04/21/2021   Procedure: XI ROBOTIC ASSISTED TOTAL HYSTERECTOMY WITH BSO and SACROCOLPOPEXY;  Surgeon: Jaquita Folds, MD;  Location[facility];  Service: Gynecology;  Laterality: Bilateral;    Family History  Problem Relation Age of Onset   Cancer Father        Lung   Tuberculosis Father    Breast cancer Maternal Grandmother 41   Ulcerative colitis Sister    Anxiety disorder Daughter    Alzheimer's disease Maternal Grandfather    Heart disease Paternal Grandmother        MI   Heart disease Paternal Grandfather        MI   Breast cancer Maternal Aunt        70's    Social History   Tobacco Use   Smoking status: Every Day    Packs/day: 1.00  Years: 45.00    Pack years: 45.00    Types: Cigarettes   Smokeless tobacco: Never  Substance Use Topics   Alcohol use: Not Currently    Comment: 1 drink per year     Current Outpatient Medications:    acetaminophen (TYLENOL) 650 MG CR tablet, Take 650 mg by mouth every 8 (eight) hours as needed for pain., Disp: , Rfl:    amLODipine (NORVASC) 5 MG tablet, Take 1 tablet (5 mg total) by mouth daily. (Patient taking differently: Take 5 mg by mouth every evening.),  Disp: 90 tablet, Rfl: 1   calcium carbonate (OS-CAL - DOSED IN MG OF ELEMENTAL CALCIUM) 1250 (500 Ca) MG tablet, Take 1 tablet by mouth., Disp: , Rfl:    cephALEXin (KEFLEX) 250 MG capsule, Take 1 capsule (250 mg total) by mouth 4 (four) times daily for 5 days., Disp: 20 capsule, Rfl: 0   cyclobenzaprine (FLEXERIL) 10 MG tablet, TAKE 1 TABLET BY MOUTH THREE TIMES DAILY FOR SPASMS, Disp: 270 tablet, Rfl: 0   DULoxetine (CYMBALTA) 60 MG capsule, Take 1 capsule (60 mg total) by mouth daily., Disp: 90 capsule, Rfl: 1   etodolac (LODINE) 300 MG capsule, Take by mouth. Patient stopped taking on 04/13/21 for surgery on 04/21/21., Disp: , Rfl:    famotidine (PEPCID) 10 MG tablet, Take 20 mg by mouth 2 (two) times daily., Disp: , Rfl:    levothyroxine (SYNTHROID) 50 MCG tablet, TAKE 1 TABLET BY MOUTH EVERY MORNING 30 MINUTES BEFORE BREAKFAST, Disp: 90 tablet, Rfl: 3   metoprolol tartrate (LOPRESSOR) 50 MG tablet, Take 50 mg by mouth 2 (two) times daily., Disp: , Rfl:    Multiple Vitamins-Minerals (CENTRUM SILVER ADULT 50+ PO), Take 1 tablet by mouth daily., Disp: , Rfl:    polyethylene glycol powder (GLYCOLAX/MIRALAX) 17 GM/SCOOP powder, Take 17 g by mouth daily. Drink 17g (1 scoop) dissolved in water per day. (Patient taking differently: Take 17 g by mouth daily. Drink 17g (1 scoop) dissolved in water per day. For after surgery on 04/21/2021.), Disp: 255 g, Rfl: 0   pregabalin (LYRICA) 100 MG capsule, Take 1 capsule (100 mg total) by mouth 2 (two) times daily. To take an additional '50mg'$  in the PM, Disp: 180 capsule, Rfl: 1   pregabalin (LYRICA) 50 MG capsule, Take 1 capsule (50 mg total) by mouth at bedtime. With the '100mg'$  (Patient taking differently: Take 50 mg by mouth at bedtime. Takes 50 mg at night along with regular 100 mg dose.), Disp: 90 capsule, Rfl: 1   triamcinolone cream (KENALOG) 0.1 %, Apply 1 application topically 2 (two) times daily. (Patient taking differently: Apply 1 application. topically as  needed.), Disp: 30 g, Rfl: 0   Zinc 30 MG TABS, Take by mouth., Disp: , Rfl:    zolpidem (AMBIEN) 10 MG tablet, Take 1 tablet (10 mg total) by mouth at bedtime., Disp: 30 tablet, Rfl: 5   Acetaminophen-Codeine 300-30 MG tablet, Take 1 tablet by mouth daily as needed. (Patient not taking: Reported on 07/14/2021), Disp: , Rfl:    cholecalciferol (VITAMIN D) 1000 UNITS tablet, Take 1,000 Units by mouth daily. (Patient not taking: Reported on 07/14/2021), Disp: , Rfl:    mometasone (ELOCON) 0.1 % cream, Apply to affected areas as directed 1-2 times per day as needed. (Patient not taking: Reported on 07/14/2021), Disp: 45 g, Rfl: 1  Allergies  Allergen Reactions   Bactrim [Sulfamethoxazole-Trimethoprim] Nausea And Vomiting   Erythromycin Other (See Comments)    cramps  Gabapentin Other (See Comments)    tremors   Levofloxacin Other (See Comments)    GI pain.   Tamiflu [Oseltamivir Phosphate] Other (See Comments)    Cramps and stomach issues    Tramadol     headaches   Nabumetone Rash    itching   Nitrofurantoin Diarrhea and Nausea And Vomiting    I personally reviewed active problem list, medication list, allergies with the patient/caregiver today.   Review of Systems  Musculoskeletal:  Positive for back pain, joint pain, myalgias and neck pain. Negative for falls.     Objective  Vitals:   07/14/21 1136  BP: 97/64  Pulse: 70  Temp: 98.1 F (36.7 C)  SpO2: 97%  Weight: 161 lb 6.4 oz (73.2 kg)    Body mass index is 26.05 kg/m.  Physical Exam Vitals reviewed.  Constitutional:      General: She is awake.     Appearance: Normal appearance. She is well-developed, well-groomed and normal weight.  HENT:     Head: Normocephalic and atraumatic.  Pulmonary:     Effort: Pulmonary effort is normal.  Neurological:     General: No focal deficit present.     Mental Status: She is alert and oriented to person, place, and time.     GCS: GCS eye subscore is 4. GCS verbal subscore is  5. GCS motor subscore is 6.     Motor: No weakness, tremor or atrophy.     Gait: Gait normal.  Psychiatric:        Attention and Perception: Attention and perception normal.        Mood and Affect: Mood and affect normal.        Speech: Speech normal.        Behavior: Behavior normal. Behavior is cooperative.        Cognition and Memory: Cognition normal.     Recent Results (from the past 2160 hour(s))  Type and screen [facility]     Status: None   Collection Time: 04/17/21  1:38 PM  Result Value Ref Range   ABO/RH(D) O NEG    Antibody Screen NEG    Sample Expiration 04/24/2021,2359    Extend sample reason      NO TRANSFUSIONS OR PREGNANCY IN THE PAST 3 MONTHS Performed at [facility], [place] [address], [place], [place] [address]   CBC     Status: Abnormal   Collection Time: 04/17/21  1:39 PM  Result Value Ref Range   WBC 7.7 4.0 - 10.5 K/uL   RBC 3.73 (L) 3.87 - 5.11 MIL/uL   Hemoglobin 12.2 12.0 - 15.0 g/dL   HCT 35.1 (L) 36.0 - 46.0 %   MCV 94.1 80.0 - 100.0 fL   MCH 32.7 26.0 - 34.0 pg   MCHC 34.8 30.0 - 36.0 g/dL   RDW 12.9 11.5 - 15.5 %   Platelets 206 150 - 400 K/uL   nRBC 0.0 0.0 - 0.2 %    Comment: Performed at [facility], [place] [address], [place], [place] [address]  Basic metabolic panel per protocol     Status: None   Collection Time: 04/17/21  1:39 PM  Result Value Ref Range   Sodium 139 135 - 145 mmol/L   Potassium 3.8 3.5 - 5.1 mmol/L   Chloride 104 98 - 111 mmol/L   CO2 28 22 - 32 mmol/L   Glucose, Bld 84 70 - 99 mg/dL    Comment: Glucose  reference range applies only to samples taken after fasting for at least 8 hours.   BUN 18 8 - 23 mg/dL   Creatinine, Ser 0.88 0.44 - 1.00 mg/dL   Calcium 9.5 8.9 - 10.3 mg/dL   GFR, Estimated >60 >60 mL/min    Comment: (NOTE) Calculated using the CKD-EPI Creatinine Equation (2021)    Anion gap 7 5 - 15    Comment: Performed at [facility],  [place] [address], [place], [place] [address]  ABO/Rh     Status: None   Collection Time: 04/21/21 10:25 AM  Result Value Ref Range   ABO/RH(D)      Jenetta Downer NEG Performed at [place] [address] [place], [place] [address]   Surgical pathology     Status: None   Collection Time: 04/21/21  1:24 PM  Result Value Ref Range   SURGICAL PATHOLOGY      SURGICAL PATHOLOGY CASE: [id number] PATIENT: [name] Surgical Pathology Report     Clinical History: Uterovaginal prolapse, incomplete; anterior vaginal prolapse, stress incontinence (crm)     FINAL MICROSCOPIC DIAGNOSIS:  A. UTERUS, CERVIX, BILATERAL FALLOPIAN TUBES AND OVARIES: - Cervix     Unremarkable.     No dysplasia or malignancy. - Endometrium     Inactive.     Two benign endometrial polyps.     No hyperplasia or malignancy. - Myometrium     Leiomyomata.     No evidence of malignancy. - Bilateral ovaries     Endosalpingosis.     No evidence of malignancy. - Right Fallopian tube     Unremarkable.     No endometriosis or malignancy. - Left Fallopian tube     Benign paratubal cyst.     No endometriosis or malignancy.   GROSS DESCRIPTION:  Specimen: TAH/BSO, received fresh Specimen integrity: Intact Size and shape: 6.8 x 4.7 x 3.2 cm symmetrical uterus Weight: The uterus weighs 47 g. Serosa: Smooth and tan Cervix: The ectocervica l mucosa is smooth, tan and the external os is patent measuring 1.2 cm.  The endocervical mucosa is glistening tan-pink. Endometrium: The endometrial cavity measures 3.2 x 2.1 cm.  At the fundus there is a 2.1 x 1.5 x 0.5 cm soft tan-pink polyp.  There is a separate 0.4 cm sessile tan polyp.  The remainder of the endometrium is glistening tan-pink and measures 0.1 cm in thickness. Myometrium: There is a 1.5 cm well-circumscribed submucosal myometrial nodule and a 0.5 cm intramural nodule.  The cut surfaces are solid and white. Right adnexa: The right  ovary is intact and measures 2.3 x 1.5 x 1 cm. The cortical and cut surfaces are unremarkable.  The right fallopian tube measures 6.8 cm in length and 0.6 cm in diameter.  There is a 1.3 cm paratubal cyst. Left adnexa: The left ovary is intact and measures 2.4 x 1.2 x 1 cm. The cortical and cut surfaces are unremarkable.  The left fallopian tube measures 7.8 cm in length and 0.6 cm in diameter. Block Summary: 9 blocks sub mitted 1 = cervix 2 = large endometrial polyp 3 = small endometrial polyp 4 = endomyometrium 5 = myometrial nodules 6, 7 = right adnexa 8, 9 = left adnexa (GRP 04/21/2021)    Final Diagnosis performed by Claudette Laws, MD.   Electronically signed 04/22/2021 Technical and / or Professional components performed at [facility], [place] [address], [place], [place] [address].  Immunohistochemistry Technical component (if applicable) was performed  at [facility]. [address], [place], [place], [place] [address].   IMMUNOHISTOCHEMISTRY DISCLAIMER (if applicable): Some of these immunohistochemical stains may have been developed and the performance characteristics determine by [facility]. Some may not have been cleared or approved by the U.S. Food and Drug Administration. The FDA has determined that such clearance or approval is not necessary. This test is used for clinical purposes. It should not be regarded as investigat ional or for research. This laboratory is certified under the [place] (CLIA-88) as qualified to perform high complexity clinical laboratory testing.  The controls stained appropriately.   Urine Culture     Status: Abnormal   Collection Time: 07/09/21 10:01 AM   Specimen: Urine, Random  Result Value Ref Range   Specimen Description URINE, RANDOM    Special Requests      NONE Performed at [facility], [address] [place], [place] [address]     Culture >=100,000 COLONIES/mL ESCHERICHIA COLI (A)    Report Status 07/11/2021 FINAL    Organism ID, Bacteria ESCHERICHIA COLI (A)       Susceptibility   Escherichia coli - MIC*    AMPICILLIN 8 SENSITIVE Sensitive     CEFAZOLIN <=4 SENSITIVE Sensitive     CEFEPIME <=0.12 SENSITIVE Sensitive     CEFTRIAXONE <=0.25 SENSITIVE Sensitive     CIPROFLOXACIN <=0.25 SENSITIVE Sensitive     GENTAMICIN <=1 SENSITIVE Sensitive     IMIPENEM <=0.25 SENSITIVE Sensitive     NITROFURANTOIN <=16 SENSITIVE Sensitive     TRIMETH/SULFA <=20 SENSITIVE Sensitive     AMPICILLIN/SULBACTAM 4 SENSITIVE Sensitive     PIP/TAZO <=4 SENSITIVE Sensitive     * >=100,000 COLONIES/mL ESCHERICHIA COLI  POCT Urinalysis Dipstick     Status: Abnormal   Collection Time: 07/09/21 10:11 AM  Result Value Ref Range   Color, UA yellow    Clarity, UA clear    Glucose, UA Negative Negative   Bilirubin, UA Large    Ketones, UA Negative    Spec Grav, UA >=1.030 (A) 1.010 - 1.025   Blood, UA Negative    pH, UA 6.5 5.0 - 8.0   Protein, UA Positive (A) Negative    Comment: Trace   Urobilinogen, UA 0.2 0.2 or 1.0 E.U./dL   Nitrite, UA Positive    Leukocytes, UA Moderate (2+) (A) Negative   Appearance     Odor       PHQ2/9:    04/13/2021    9:10 AM 03/12/2021    9:26 AM 01/12/2021    1:17 PM 10/27/2020    3:25 PM 07/11/2020    1:31 PM  Depression screen PHQ 2/9  Decreased Interest 0 0 1 0 0  Down, Depressed, Hopeless 0 0 0 0 0  PHQ - 2 Score 0 0 1 0 0  Altered sleeping '2 3 3  '$ 0  Tired, decreased energy '3 2 3  '$ 0  Change in appetite 0 3 0  0  Feeling bad or failure about yourself  0 3 0  0  Trouble concentrating '3 2 3  '$ 0  Moving slowly or fidgety/restless 0 0 0  0  Suicidal thoughts 0 0 0  0  PHQ-9 Score '8 13 10  '$ 0      Fall Risk:    03/12/2021    9:25 AM 10/27/2020    3:24 PM 07/11/2020    1:30 PM 10/08/2019    8:27 AM 10/03/2018  10:45 AM  Fall Risk   Falls in the past year? 1 1 0 1 1  Comment  tripped, lost  balance, missed step  trip over feet   Number falls in past yr:  1 0 0 1  Injury with Fall? 1 0 0 0 0  Risk for fall due to : History of fall(s) Medication side effect;Impaired balance/gait No Fall Risks Impaired balance/gait;Medication side effect History of fall(s)  Follow up Falls evaluation completed Falls evaluation completed;Education provided;Falls prevention discussed Falls evaluation completed Falls evaluation completed;Education provided;Falls prevention discussed       Functional Status Survey:      Assessment & Plan  Problem List Items Addressed This Visit       Other   Insomnia - Primary   Fibromyalgia    Chronic, historic condition  She states her symptoms are improved now that the weather is getting warmer and drier  Reports excellent compliance with medication regimen and states symptoms are well controlled on current regimen Continue current regimen Continue seeing pain management for further management  Follow up in 3 months           No follow-ups on file.   I, Coley Kulikowski E Julanne Schlueter, PA-C, have reviewed all documentation for this visit. The documentation on 07/14/21 for the exam, diagnosis, procedures, and orders are all accurate and complete.   Talitha Givens, MHS, PA-C [facility]

## 2021-07-14 NOTE — Assessment & Plan Note (Addendum)
Chronic, historic condition  She states her symptoms are improved now that the weather is getting warmer and drier  Reports excellent compliance with medication regimen and states symptoms are well controlled on current regimen Continue current regimen Continue seeing pain management for further management  Follow up in 3 months

## 2021-07-17 ENCOUNTER — Ambulatory Visit (INDEPENDENT_AMBULATORY_CARE_PROVIDER_SITE_OTHER): Payer: Medicare Other | Admitting: Obstetrics and Gynecology

## 2021-07-17 DIAGNOSIS — R350 Frequency of micturition: Secondary | ICD-10-CM | POA: Diagnosis not present

## 2021-07-17 DIAGNOSIS — N393 Stress incontinence (female) (male): Secondary | ICD-10-CM

## 2021-07-17 LAB — POCT URINALYSIS DIPSTICK
Appearance: NORMAL
Blood, UA: NEGATIVE
Glucose, UA: NEGATIVE
Ketones, UA: NEGATIVE
Leukocytes, UA: NEGATIVE
Nitrite, UA: NEGATIVE
Protein, UA: NEGATIVE
Spec Grav, UA: 1.015 (ref 1.010–1.025)
Urobilinogen, UA: 0.2 E.U./dL
pH, UA: 6.5 (ref 5.0–8.0)

## 2021-07-17 NOTE — Patient Instructions (Signed)

## 2021-07-20 ENCOUNTER — Telehealth: Payer: Self-pay | Admitting: *Deleted

## 2021-07-20 NOTE — Telephone Encounter (Signed)
Left message for pt to call back to schedule f/u lung screening CT scan.  ?

## 2021-07-20 NOTE — Telephone Encounter (Signed)
Spoke with pt regarding follow up lung screening . Pt's last CT was done one 09/24/20. Pt will not be due again until 09/25/2021. Pt will be moving to [place] at the end of June 2023. Pt states she will contact the lung screening program at [facility] to continue her screening. Pt states this is the closet to where she will be living. Nothing further needed at this time.

## 2021-07-22 DIAGNOSIS — M797 Fibromyalgia: Secondary | ICD-10-CM | POA: Diagnosis not present

## 2021-07-22 DIAGNOSIS — G894 Chronic pain syndrome: Secondary | ICD-10-CM | POA: Diagnosis not present

## 2021-07-22 DIAGNOSIS — M1990 Unspecified osteoarthritis, unspecified site: Secondary | ICD-10-CM | POA: Diagnosis not present

## 2021-07-24 ENCOUNTER — Ambulatory Visit (INDEPENDENT_AMBULATORY_CARE_PROVIDER_SITE_OTHER): Payer: Medicare Other | Admitting: Obstetrics and Gynecology

## 2021-07-24 ENCOUNTER — Encounter: Payer: Self-pay | Admitting: Obstetrics and Gynecology

## 2021-07-24 VITALS — BP 130/77 | HR 73

## 2021-07-24 DIAGNOSIS — N393 Stress incontinence (female) (male): Secondary | ICD-10-CM | POA: Diagnosis not present

## 2021-07-24 DIAGNOSIS — N342 Other urethritis: Secondary | ICD-10-CM | POA: Diagnosis not present

## 2021-07-24 DIAGNOSIS — R339 Retention of urine, unspecified: Secondary | ICD-10-CM | POA: Diagnosis not present

## 2021-07-24 MED ORDER — ESTRADIOL 0.1 MG/GM VA CREA: 0.5000 g | TOPICAL_CREAM | VAGINAL | 11 refills | Status: AC

## 2021-07-24 NOTE — Progress Notes (Unsigned)
[facility] Return Visit  SUBJECTIVE  History of Present Illness: Diane Pearson is a 66 y.o. female seen in follow-up after urodynamic testing.   She has urinary leakage only when her bladder is really full. Only wears a mini pad at home. Otherwise does not have leakage or urgency. She is able to sleep all night. She feels that she is able to empty her bladder well unless she is at the doctor or away from home.   She is wondering if she should take cranberry pills to help prevent infection.   Surgery: s/p Robotic assisted total laparoscopic hysterectomy with bilateral salpingo-oophorectomy, sacrocolpopexy, cystoscopy on 04/22/21   Past Medical History: Patient  has a past medical history of Ambulates with cane, Arthritis, CAD (coronary artery disease), Chronic myofascial pain, Chronic pain of both knees, COVID (2021), Depression, Diverticulosis, Eczema, Family history of breast cancer, Fibromyalgia, GERD (gastroesophageal reflux disease), Hypertension, Hypothyroidism, Insomnia, Menopause, Osteoporosis, SUI (stress urinary incontinence, female), Tremors of nervous system, and Wears glasses.   Past Surgical History: She  has a past surgical history that includes varicose veins (1998); Colonoscopy with propofol (N/A, 09/29/2018); Breast biopsy (Left, 04/20/2016); Foot surgery (11/13/2020); Endometrial biopsy (03/20/2021); Refractive surgery; Xi robotic assisted total hysterectomy with sacrocolpopexy (Bilateral, 04/21/2021); Cystoscopy (N/A, 04/21/2021); and Abdominal hysterectomy.   Medications: She has a current medication list which includes the following prescription(s): acetaminophen, amlodipine, calcium carbonate, cyclobenzaprine, duloxetine, etodolac, famotidine, levothyroxine, metoprolol tartrate, multiple vitamins-minerals, polyethylene glycol powder, pregabalin, pregabalin, triamcinolone cream, zinc, and zolpidem.   Allergies: Patient is allergic to bactrim  [sulfamethoxazole-trimethoprim], erythromycin, gabapentin, levofloxacin, tamiflu [oseltamivir phosphate], tramadol, nabumetone, and nitrofurantoin.   Social History: Patient  reports that she has been smoking cigarettes. She has a 45.00 pack-year smoking history. She has never used smokeless tobacco. She reports that she does not currently use alcohol. She reports that she does not use drugs.      OBJECTIVE     Physical Exam: Vitals:   07/24/21 1406  BP: 130/77  Pulse: 73   Gen: No apparent distress, A&O x 3.  Detailed Urogynecologic Evaluation:  Deferred. Prior exam showed:      No data to display             ASSESSMENT AND PLAN    Diane Pearson is a 66 y.o. with:  No diagnosis found.

## 2021-07-28 ENCOUNTER — Other Ambulatory Visit: Payer: Self-pay | Admitting: Nurse Practitioner

## 2021-07-28 ENCOUNTER — Other Ambulatory Visit: Payer: Self-pay | Admitting: Family Medicine

## 2021-07-29 ENCOUNTER — Other Ambulatory Visit: Payer: Self-pay | Admitting: Family Medicine

## 2021-07-29 MED ORDER — ZOLPIDEM TARTRATE 10 MG PO TABS: 10.0000 mg | ORAL_TABLET | Freq: Every day | ORAL | 2 refills | Status: AC

## 2021-07-29 NOTE — Progress Notes (Signed)
refill 

## 2021-07-29 NOTE — Telephone Encounter (Signed)
Requested medication (s) are due for refill today: Yes  Requested medication (s) are on the active medication list: Yes  Last refill:  01/12/21  Future visit scheduled: Yes  Notes to clinic:  See request.    Requested Prescriptions  Pending Prescriptions Disp Refills   zolpidem (AMBIEN) 10 MG tablet [Pharmacy Med Name: ZOLPIDEM '10MG'$  TABLETS] 30 tablet     Sig: TAKE 1 TABLET(10 MG) BY MOUTH AT BEDTIME     Not Delegated - Psychiatry:  Anxiolytics/Hypnotics Failed - 07/28/2021  2:08 PM      Failed - This refill cannot be delegated      Failed - Urine Drug Screen completed in last 360 days      Passed - Valid encounter within last 6 months    Recent Outpatient Visits           2 weeks ago Primary insomnia   [place], PA-C   3 months ago Tremor   [place], [place], DO   4 months ago Urine frequency   [place], [place], MD   6 months ago Routine general medical examination at a health care facility   [place], [place], DO   1 year ago Aortic atherosclerosis [facility])   [facility] Valerie Roys, DO       Future Appointments             In 2 months Johnson, Barb Merino, DO [place], [place]   In 3 months  [employer], [place]

## 2021-07-29 NOTE — Telephone Encounter (Signed)
Requested medication (s) are due for refill today: Yes  Requested medication (s) are on the active medication list: Yes  Last refill:  04/22/21  Future visit scheduled: Yes  Notes to clinic:  See request.    Requested Prescriptions  Pending Prescriptions Disp Refills   cyclobenzaprine (FLEXERIL) 10 MG tablet [Pharmacy Med Name: CYCLOBENZAPRINE '10MG'$  TABLETS] 270 tablet 0    Sig: TAKE 1 TABLET BY MOUTH THREE TIMES DAILY FOR SPASMS     Not Delegated - Analgesics:  Muscle Relaxants Failed - 07/28/2021  2:08 PM      Failed - This refill cannot be delegated      Passed - Valid encounter within last 6 months    Recent Outpatient Visits           2 weeks ago Primary insomnia   [place], PA-C   3 months ago Tremor   [place], [place], DO   4 months ago Urine frequency   [place], MD   6 months ago Routine general medical examination at a health care facility   [place], [place], DO   1 year ago Aortic atherosclerosis [facility])   [facility] Valerie Roys, DO       Future Appointments             In 2 months Johnson, Barb Merino, DO [place], [place]   In 3 months  [employer], [place]

## 2021-08-03 ENCOUNTER — Encounter: Payer: Self-pay | Admitting: Obstetrics and Gynecology

## 2021-08-18 ENCOUNTER — Other Ambulatory Visit: Payer: Self-pay

## 2021-08-18 ENCOUNTER — Ambulatory Visit: Payer: Commercial Managed Care - PPO | Attending: NURSE PRACTITIONER | Admitting: NURSE PRACTITIONER

## 2021-08-18 ENCOUNTER — Encounter (HOSPITAL_BASED_OUTPATIENT_CLINIC_OR_DEPARTMENT_OTHER): Payer: Self-pay | Admitting: NURSE PRACTITIONER

## 2021-08-18 DIAGNOSIS — F17210 Nicotine dependence, cigarettes, uncomplicated: Secondary | ICD-10-CM | POA: Insufficient documentation

## 2021-08-18 DIAGNOSIS — Z122 Encounter for screening for malignant neoplasm of respiratory organs: Secondary | ICD-10-CM | POA: Insufficient documentation

## 2021-08-18 DIAGNOSIS — Z87891 Personal history of nicotine dependence: Secondary | ICD-10-CM

## 2021-08-18 DIAGNOSIS — Z801 Family history of malignant neoplasm of trachea, bronchus and lung: Secondary | ICD-10-CM | POA: Insufficient documentation

## 2021-08-18 DIAGNOSIS — Z720 Tobacco use: Secondary | ICD-10-CM | POA: Insufficient documentation

## 2021-08-18 NOTE — H&P (Signed)
LUNG CANCER SCREENING SDM VISIT  [facility] [id number]  Operated by [facility]  Telephone Visit    Name:  Vanessa [name] MRN: [id number]   Date:  08/18/2021  Age:   66 y.o.     I personally offered the service to the patient, and obtained verbal consent to provide this service.  Last office visit in this department: N/A    Reason for call: Lung cancer screening  Referred by: No ref. provider found     Call notes:  Vanessa [name] is a 66 y.o. female who presents today as a new patient for a lung cancer screening.  Patient is a current 1 pack/day cigarette smoker for 47 years.  [name] would like to stop smoking in the next couple of months.  Patient has a previous quit history of 3x.  If all screening criteria met, the patient would like to get a LDCT scan to screen for lung cancer.  Patient affirms her father had lung cancer, TB exposure, and worked in the [employer]. Denies personal exposures.  Patient considers self to be in good health and denies having any hemoptysis, cough, shortness of breath, fever, recent illness or infection, or any changes in weight.    All other ROS are negative     LDCT Screening Eligibility Requirements:   1. Is the patient 70-39 years old?:  50  2. Is the patient a current smoker or former smoker who has quit within the past 15 years?: current   3. Does the patient have > 20 pack year smoking history?: 47  4. Is the patient asymptomatic for lung cancer with no personal history of lung cancer?: yes  5. Has the patient had a chest CT scan in the last year?: no  6. Is the patient healthy enough and willing to receive potentially curative treatment?: yes    History reviewed. No pertinent past medical history.      Social History     Tobacco Use   . Smoking status: Every Day     Packs/day: 1.00     Years: 47.00     Pack years: 47.00     Types: Cigarettes     Start date: 40   . Smokeless tobacco: Never       (Z12.2) Encounter for screening for lung  cancer  (primary encounter diagnosis)  Plan: CT LUNG SCREENING LDCT [IMG11060] UTN    (F17.210) Cigarette smoker  Plan: CT LUNG SCREENING LDCT [IMG11060] UTN    (Z72.0) Tobacco abuse  Plan: CT LUNG SCREENING LDCT [IMG11060] UTN    . Assessed patient smoking status, pack years, readiness to quit. If interested in cessation counseling with APP, please call our office when ready to proceed.  Recommend calling 1-800-QUITNOW 340-621-1832) for free counseling and NRT.    CT Lung Cancer Screening shared decision making:     I have determined that the patient is eligible for a low dose CT based on age, absence of signs or symptoms of lung cancer, and total pack years   The patient and I engaged in shared decision making, including the use of one or more decision aids, to include benefits, harms, follow-up diagnostic testing, over-diagnosis, false positive rate, and total radiation exposure. The patient understands and feels comfortable with it.  Screening tool used Lung Cancer Screening: A Clinician's Checklist from [employer] (2016).    The patient was counseled on the importance of adherence to annual LDCT lung cancer screening, impact  of comorbidities and ability or willingness to undergo diagnosis and treatment.  The patient understands and feels comfortable with it.   Lung Cancer Screening educational materials reviewed to patient over today's phone call: Understanding Nodules from [place] (2018)   Counseling on the importance of maintaining cigarette smoking abstinence if former smoker; or the importance of smoking cessation if current smoker and, if appropriate, furnishing of information about tobacco cessation interventions.      DISPOSITION: LDCT order placed for UTN.  Nurse Navigator notified.  Once patient's LDCT scan has been completed and reviewed, patient and their PCP or other referring provider will be contacted with those results and follow up scan recommendations.    Total minutes of smoking  cessation counseling provided at today's lung screening visit: 69mnutes   Total provider time spent with the patient on the phone: 535mutes.    [name] given the opportunity to ask questions, which these were answered to their satisfaction.  The patient is welcome to call with any questions or concerns in the meantime.       MaReva BoresMSN, APRN, FNP-C, [facility]essation  [phone]

## 2021-08-20 ENCOUNTER — Other Ambulatory Visit: Payer: Self-pay | Admitting: Family Medicine

## 2021-08-20 NOTE — Telephone Encounter (Signed)
Requested Prescriptions  Pending Prescriptions Disp Refills  . amLODipine (NORVASC) 5 MG tablet [Pharmacy Med Name: AMLODIPINE BESYLATE '5MG'$  TABLETS] 90 tablet 0    Sig: Take 1 tablet (5 mg total) by mouth every evening.     Cardiovascular: Calcium Channel Blockers 2 Passed - 08/20/2021  3:36 AM      Passed - Last BP in normal range    BP Readings from Last 1 Encounters:  07/24/21 130/77         Passed - Last Heart Rate in normal range    Pulse Readings from Last 1 Encounters:  07/24/21 73         Passed - Valid encounter within last 6 months    Recent Outpatient Visits          1 month ago Primary insomnia   [facility] Mecum, Dani Gobble, PA-C   4 months ago Tremor   [employer], Symonds, DO   5 months ago Urine frequency   [place], MD   7 months ago Routine general medical examination at a health care facility   [place], Ritchie, DO   1 year ago Aortic atherosclerosis [facility])   [facility] Valerie Roys, DO      Future Appointments            In 1 month Johnson, Barb Merino, DO [place], [place]   In 2 months  [employer], [place]

## 2021-08-24 ENCOUNTER — Other Ambulatory Visit: Payer: Self-pay

## 2021-09-08 ENCOUNTER — Other Ambulatory Visit (HOSPITAL_COMMUNITY): Payer: Self-pay | Admitting: INTERNAL MEDICINE

## 2021-09-08 ENCOUNTER — Other Ambulatory Visit: Payer: Self-pay

## 2021-09-08 DIAGNOSIS — Z1231 Encounter for screening mammogram for malignant neoplasm of breast: Secondary | ICD-10-CM

## 2021-09-09 ENCOUNTER — Telehealth: Payer: Self-pay

## 2021-09-09 NOTE — Telephone Encounter (Signed)
Copied from [place]. Topic: General - Other >> Sep 09, 2021  2:36 PM Diane Pearson wrote: Reason for ELT:RVUYEBX called to cancel AWV for 09/22. She doesn't want to reschedule since she is in [place] now.    Appointment cancelled per patient request.

## 2021-09-24 ENCOUNTER — Ambulatory Visit: Admission: RE | Admit: 2021-09-24 | Payer: Self-pay | Source: Ambulatory Visit

## 2021-09-30 ENCOUNTER — Ambulatory Visit (HOSPITAL_COMMUNITY): Payer: Self-pay

## 2021-10-04 ENCOUNTER — Other Ambulatory Visit: Payer: Self-pay | Admitting: Family Medicine

## 2021-10-05 NOTE — Telephone Encounter (Signed)
Requested medications are due for refill today.  unsure  Requested medications are on the active medications list.  yes  Last refill. 06/17/2021 unknown quantity  Future visit scheduled.   yes  Notes to clinic.  Medication was discontinued 07/14/2021. Med marked as historical.    Requested Prescriptions  Pending Prescriptions Disp Refills   metoprolol succinate (TOPROL-XL) 50 MG 24 hr tablet [Pharmacy Med Name: METOPROLOL ER SUCCINATE '50MG'$  TABS] 90 tablet 0    Sig: TAKE 1 TABLET BY MOUTH DAILY WITH OR IMMEDIATELY FOLLOWING A MEAL     Cardiovascular:  Beta Blockers Passed - 10/04/2021  3:36 AM      Passed - Last BP in normal range    BP Readings from Last 1 Encounters:  07/24/21 130/77         Passed - Last Heart Rate in normal range    Pulse Readings from Last 1 Encounters:  07/24/21 73         Passed - Valid encounter within last 6 months    Recent Outpatient Visits           2 months ago Primary insomnia   [facility] Mecum, Dani Gobble, PA-C   5 months ago Tremor   [place], [place]verne, DO   6 months ago Urine frequency   [place], Avanti, MD   8 months ago Routine general medical examination at a health care facility   [place], [place], DO   1 year ago Aortic atherosclerosis [facility])   [place], Barb Merino, DO       Future Appointments             In 1 week Wynetta Emery, Barb Merino, DO [employer], [facility]

## 2021-10-14 ENCOUNTER — Ambulatory Visit: Payer: Self-pay | Admitting: Family Medicine

## 2021-10-26 ENCOUNTER — Ambulatory Visit
Admission: RE | Admit: 2021-10-26 | Discharge: 2021-10-26 | Disposition: A | Discharge status: Home / Self Care | Payer: Medicare PPO | Source: Ambulatory Visit | Attending: NURSE PRACTITIONER | Admitting: NURSE PRACTITIONER

## 2021-10-26 ENCOUNTER — Other Ambulatory Visit: Payer: Self-pay

## 2021-10-26 DIAGNOSIS — Z122 Encounter for screening for malignant neoplasm of respiratory organs: Secondary | ICD-10-CM | POA: Insufficient documentation

## 2021-10-26 DIAGNOSIS — Z720 Tobacco use: Secondary | ICD-10-CM | POA: Insufficient documentation

## 2021-10-26 DIAGNOSIS — F17210 Nicotine dependence, cigarettes, uncomplicated: Secondary | ICD-10-CM | POA: Insufficient documentation

## 2021-10-27 ENCOUNTER — Encounter (HOSPITAL_BASED_OUTPATIENT_CLINIC_OR_DEPARTMENT_OTHER): Payer: Self-pay | Admitting: NURSE PRACTITIONER

## 2021-10-27 DIAGNOSIS — Z122 Encounter for screening for malignant neoplasm of respiratory organs: Secondary | ICD-10-CM

## 2021-10-27 DIAGNOSIS — F17210 Nicotine dependence, cigarettes, uncomplicated: Secondary | ICD-10-CM

## 2021-10-27 DIAGNOSIS — Z720 Tobacco use: Secondary | ICD-10-CM

## 2021-10-28 ENCOUNTER — Telehealth: Payer: Self-pay | Admitting: Family Medicine

## 2021-10-28 NOTE — Telephone Encounter (Signed)
Copied from [place] [id number]. Topic: Appointment Scheduling - Scheduling Inquiry for Clinic >> Oct 27, 2021  4:41 PM [place] B wrote: Reason for CRM: Patient called in , has moved to [place] s she doesn't need to schedule the AWV

## 2021-10-30 ENCOUNTER — Ambulatory Visit: Payer: Medicare Other

## 2021-11-04 ENCOUNTER — Encounter (HOSPITAL_BASED_OUTPATIENT_CLINIC_OR_DEPARTMENT_OTHER): Payer: Self-pay

## 2021-11-04 ENCOUNTER — Other Ambulatory Visit: Payer: Self-pay

## 2021-11-04 ENCOUNTER — Other Ambulatory Visit (HOSPITAL_BASED_OUTPATIENT_CLINIC_OR_DEPARTMENT_OTHER): Payer: Medicare PPO

## 2021-11-04 ENCOUNTER — Inpatient Hospital Stay
Admission: RE | Admit: 2021-11-04 | Discharge: 2021-11-04 | Disposition: A | Discharge status: Home / Self Care | Payer: Medicare PPO | Source: Ambulatory Visit | Attending: INTERNAL MEDICINE | Admitting: INTERNAL MEDICINE

## 2021-11-04 DIAGNOSIS — E039 Hypothyroidism, unspecified: Secondary | ICD-10-CM | POA: Insufficient documentation

## 2021-11-04 DIAGNOSIS — Z0001 Encounter for general adult medical examination with abnormal findings: Secondary | ICD-10-CM

## 2021-11-04 DIAGNOSIS — Z1231 Encounter for screening mammogram for malignant neoplasm of breast: Secondary | ICD-10-CM | POA: Insufficient documentation

## 2021-11-04 LAB — COMPREHENSIVE METABOLIC PNL, FASTING
ALBUMIN: 3.3 g/dL — ABNORMAL LOW (ref 3.4–4.8)
ALKALINE PHOSPHATASE: 121 U/L (ref 50–130)
ALT (SGPT): 17 U/L (ref 8–22)
ANION GAP: 6 mmol/L (ref 4–13)
AST (SGOT): 22 U/L (ref 8–45)
BILIRUBIN TOTAL: 0.6 mg/dL (ref 0.3–1.3)
BUN/CREA RATIO: 21 (ref 6–22)
BUN: 26 mg/dL — ABNORMAL HIGH (ref 8–25)
CALCIUM: 9.3 mg/dL (ref 8.6–10.3)
CHLORIDE: 106 mmol/L (ref 96–111)
CO2 TOTAL: 28 mmol/L (ref 23–31)
CREATININE: 1.23 mg/dL — ABNORMAL HIGH (ref 0.60–1.05)
ESTIMATED GFR: 49 mL/min/BSA — ABNORMAL LOW (ref >=60)
GLUCOSE: 74 mg/dL (ref 70–99)
POTASSIUM: 4.1 mmol/L (ref 3.5–5.1)
PROTEIN TOTAL: 6.9 g/dL (ref 6.0–8.0)
SODIUM: 140 mmol/L (ref 136–145)

## 2021-11-04 LAB — CBC WITH DIFF
BASOPHIL #: <0.1 10*3/uL (ref <=0.20)
BASOPHIL %: 1 %
EOSINOPHIL #: 0.61 10*3/uL — ABNORMAL HIGH (ref <=0.50)
EOSINOPHIL %: 8 %
HCT: 34 % — ABNORMAL LOW (ref 34.8–46.0)
HGB: 11.4 g/dL — ABNORMAL LOW (ref 11.5–16.0)
IMMATURE GRANULOCYTE #: <0.1 10*3/uL (ref <0.10)
IMMATURE GRANULOCYTE %: 0 % (ref 0–1)
LYMPHOCYTE #: 2.95 10*3/uL (ref 1.00–4.80)
LYMPHOCYTE %: 36 %
MCH: 32 pg (ref 26.0–32.0)
MCHC: 33.5 g/dL (ref 31.0–35.5)
MCV: 95.5 fL (ref 78.0–100.0)
MONOCYTE #: 0.56 10*3/uL (ref 0.20–1.10)
MONOCYTE %: 7 %
MPV: 11.6 fL (ref 8.7–12.5)
NEUTROPHIL #: 3.95 10*3/uL (ref 1.50–7.70)
NEUTROPHIL %: 48 %
PLATELETS: 250 10*3/uL (ref 150–400)
RBC: 3.56 10*6/uL — ABNORMAL LOW (ref 3.85–5.22)
RDW-CV: 13.3 % (ref 11.5–15.5)
WBC: 8.2 10*3/uL (ref 3.7–11.0)

## 2021-11-04 LAB — LIPID PANEL
CHOL/HDL RATIO: 6.6
CHOLESTEROL: 145 mg/dL (ref 100–200)
HDL CHOL: 22 mg/dL — ABNORMAL LOW (ref >=50)
LDL CALC: 98 mg/dL (ref <100)
NON-HDL: 123 mg/dL (ref <=190)
TRIGLYCERIDES: 139 mg/dL (ref <150)
VLDL CALC: 23 mg/dL (ref <30)

## 2021-11-04 LAB — THYROID STIMULATING HORMONE (SENSITIVE TSH): TSH: 2.88 u[IU]/mL (ref 0.350–4.940)

## 2021-11-04 LAB — THYROXINE, FREE (FREE T4): THYROXINE (T4), FREE: 0.97 ng/dL (ref 0.70–1.48)

## 2021-11-11 ENCOUNTER — Other Ambulatory Visit (HOSPITAL_COMMUNITY): Payer: Self-pay

## 2021-11-18 ENCOUNTER — Encounter (HOSPITAL_COMMUNITY): Payer: Self-pay

## 2021-12-10 ENCOUNTER — Other Ambulatory Visit: Payer: Self-pay | Admitting: Family Medicine

## 2021-12-10 NOTE — Telephone Encounter (Signed)
Requested medication (s) are due for refill today:   Provider to review  Requested medication (s) are on the active medication list:   Yes  Future visit scheduled:   No    Last ordered: 07/29/2021 #30, 2 refills  Returned because it's a non delegated refill   Requested Prescriptions  Pending Prescriptions Disp Refills   zolpidem (AMBIEN) 10 MG tablet [Pharmacy Med Name: ZOLPIDEM '10MG'$  TABLETS] 30 tablet     Sig: TAKE 1 TABLET(10 MG) BY MOUTH AT BEDTIME     Not Delegated - Psychiatry:  Anxiolytics/Hypnotics Failed - 12/10/2021 12:30 PM      Failed - This refill cannot be delegated      Failed - Urine Drug Screen completed in last 360 days      Passed - Valid encounter within last 6 months    Recent Outpatient Visits           4 months ago Primary insomnia   [facility] Mecum, Dani Gobble, PA-C   8 months ago [place], [place], DO   9 months ago Urine frequency   [place], MD   11 months ago Routine general medical examination at a health care facility   [place], [place], DO   1 year ago Aortic atherosclerosis [facility])   [place], Megan P, DO

## 2022-02-25 ENCOUNTER — Other Ambulatory Visit: Payer: Medicare PPO | Attending: OBSTETRICS/GYNECOLOGY | Admitting: OBSTETRICS/GYNECOLOGY

## 2022-02-25 ENCOUNTER — Other Ambulatory Visit: Payer: Self-pay

## 2022-02-25 ENCOUNTER — Ambulatory Visit (INDEPENDENT_AMBULATORY_CARE_PROVIDER_SITE_OTHER): Payer: Medicare PPO | Admitting: OBSTETRICS/GYNECOLOGY

## 2022-02-25 ENCOUNTER — Encounter (INDEPENDENT_AMBULATORY_CARE_PROVIDER_SITE_OTHER): Payer: Self-pay | Admitting: OBSTETRICS/GYNECOLOGY

## 2022-02-25 VITALS — BP 124/68 | Ht 65.0 in | Wt 155.4 lb

## 2022-02-25 DIAGNOSIS — R399 Unspecified symptoms and signs involving the genitourinary system: Secondary | ICD-10-CM

## 2022-02-25 DIAGNOSIS — N898 Other specified noninflammatory disorders of vagina: Secondary | ICD-10-CM

## 2022-02-25 DIAGNOSIS — N816 Rectocele: Secondary | ICD-10-CM

## 2022-02-25 DIAGNOSIS — N952 Postmenopausal atrophic vaginitis: Secondary | ICD-10-CM

## 2022-02-25 LAB — URINALYSIS, MICROSCOPIC
HYALINE CASTS: 15 /lpf — ABNORMAL HIGH (ref <4.0)
RBCS: 6 /hpf — ABNORMAL HIGH (ref <6.0)
WBCS: 0 /hpf (ref <11.0)

## 2022-02-25 LAB — URINALYSIS, MACROSCOPIC
BLOOD: NEGATIVE mg/dL
COLOR: NORMAL
GLUCOSE: NEGATIVE mg/dL
KETONES: NEGATIVE mg/dL
NITRITE: NEGATIVE
PH: 6 (ref 5.0–8.0)
PROTEIN: NEGATIVE mg/dL
SPECIFIC GRAVITY: 1.017 (ref 1.005–1.030)
UROBILINOGEN: NEGATIVE mg/dL

## 2022-02-25 NOTE — Progress Notes (Signed)
Progress Note      Chief Complaint:  This is a 67 y.o. White female who is a [id number] that presents for concerns with continued difficulty with urination after recent uro gynecologic surgery.    HPI:  The patient is a 67 year old Caucasian female gravida 2 para 1011 who has been menopausal for greater than 10 years, of note, she has been complaining of symptoms of pelvic relaxation since 2019.  She initially described difficulty with initiation of urinary stream and occasional loss of urine and there was found to have significant pelvic relaxation and underwent total laparoscopic hysterectomy, bilateral salpingo oophorectomy, laparoscopic sacrocolpopexy on April 22, 2021.  Of note, she had been evaluated by Urogynecology in [facility] and instructed that she could either have a mid urethral sling with her initial procedure or would potentially be when after the procedure if this was not performed at the time of her initial surgical intervention.  She was informed by her provider that the provider was concern for the potential of urinary retention postoperatively and the urogynecologist who advised her to hold on mid urethral sling procedure.  The patient did experienced 2-3 days of urinary retention postoperatively and then went on to have concern with not feeling that she was voiding as frequently as she should during the day and often awakens 2-3 times at night to empty her bladder and leaks on a daily basis with coughing laughing sneezing or rapid movements since the surgery.  She had been offered the opportunity for mid urethral sling by the urogynecologist at her postoperative examination which the patient declined initially.    Change in sexual partner:  Currently not sexually active  Abnormal pap history:  Denies  Last pap:  2019 negative by her report  STD history:  Denies, the patient does have occasional concerns with bacterial vaginosis but no sexually transmitted infections  HRT Use:  None at this  time    Last Mammogram:    Recent Results (from the past 17520 hour(s))   MAMMO BILATERAL SCREENING-ADDL VIEWS/BREAST US AS REQ BY RAD    Collection Time: 11/04/21  1:09 PM    Narrative    INDICATION:  Screening exam.  History of breast cancer in maternal   grandmother and maternal aunt.    TECHNIQUE:  Bilateral CC and MLO 2D and 3D tomosynthesis.  Computer-aided   detection was used.    COMPARISON:  Outside films from 09/18/2020 and prior. Please note that   reporting of the current study was delayed while awaiting the submission   of outside imaging for comparison.    BREAST DENSITY:  The breasts are heterogeneously dense, which may obscure   small masses.    FINDINGS:  Stable fibroglandular tissues. Benign calcifications.  There   are no suspicious masses, architectural distortion, or   microcalcifications.      Impression    No mammographic evidence of malignancy.    OVERALL ASSESSMENT:  BI-RADS Category 2: Benign    RECOMMENDATION:  Routine annual screening mammography.      Signed by Tomasita Crumble, MD       Last DXA:  No results found for this or any previous visit (from the past [id number] hour(s)).     Menstrual History    Total time span for birth control pills      Use of HRT (how long total use) Yes prempro    Use of Infertility Treatments No     Age of Menarche 7  Avg. frequency (days)      Duration (days)      Flow      Pattern      Age of Menopause            Past Medical History:  All were reviewed and updated in the EMR.  Past Surgical History:   Procedure Laterality Date    ABDOMINAL SACROCOLPOPEXY N/A 04/22/2021    at [place]  04/22/2021    HX TLHBSO Bilateral 04/22/2021    TLH/BSO    VARICOSE VEIN SURGERY            Past Medical History:   Diagnosis Date    Allergic rhinitis     Depression     Disorder of thyroid     Fibromyalgia     Osteoarthritis     TMJ (dislocation of temporomandibular joint)     Tremors of nervous system     Vaginal infection     recurrent bv     Varicosities of leg          Current Outpatient Medications   Medication Sig    acetaminophen-codeine (TYLENOL #3) 300-30 mg Oral Tablet Take 1 Tablet by mouth    amLODIPine (NORVASC) 5 mg Oral Tablet Take 1 Tablet (5 mg total) by mouth Once a day    cholecalciferol, vitamin D3, 25 mcg (1,000 unit) Oral Tablet Take 1 Tablet (1,000 Units total) by mouth    cyclobenzaprine (FLEXERIL) 10 mg Oral Tablet TAKE 1 TABLET BY MOUTH THREE TIMES DAILY FOR SPASMS    DULoxetine (CYMBALTA DR) 60 mg Oral Capsule, Delayed Release(E.C.) Take 1 Capsule (60 mg total) by mouth Once a day    estradioL (ESTRACE) 0.01 % (0.1 mg/gram) Vaginal Cream Insert 0.5 g into the vagina    etodolac (LODINE) 300 mg Oral Capsule Take 1 Capsule (300 mg total) by mouth Twice daily    famotidine (PEPCID) 10 mg Oral Tablet Take 2 Tablets (20 mg total) by mouth    levothyroxine (SYNTHROID) 50 mcg Oral Tablet TAKE 1 TABLET BY MOUTH EVERY MORNING 30 MINUTES BEFORE BREAKFAST    magnesium oxide (MAG-OX) 400 mg Oral Tablet Take 1 Tablet (400 mg total) by mouth    metoprolol tartrate (LOPRESSOR) 50 mg Oral Tablet Take 1 Tablet (50 mg total) by mouth Twice daily    multivitamin Oral Tablet Take 1 Tablet by mouth    pregabalin (LYRICA) 100 mg Oral Capsule TAKE 1 CAPSULE BY MOUTH EVERY MORNING AND 1 CAPSULE EVERY NIGHT    pregabalin (LYRICA) 50 mg Oral Capsule TAKE 1 CAPSULE BY MOUTH AT NIGHT ALONG WITH 100 MG    triamcinolone acetonide 0.1 % Ointment APPLY TWICE DAILY TO AFFECTED AREA FOR TWO WEEKS, TAKE A TWO WEEK BREAK AND REPEAT AS NEEDED.    zolpidem (AMBIEN) 10 mg Oral Tablet Take 1 Tablet (10 mg total) by mouth Once a day     Allergies   Allergen Reactions    Nabumetone Rash     itching   itching   itching   itching    itching   itching    itching    Tramadol  Other Adverse Reaction (Add comment)     headaches   headaches   headaches   headaches    headaches   headaches    headaches    Trazodone     Erythromycin Diarrhea and  Other Adverse Reaction (Add  comment)     cramps  Other reaction(s): Muscle Pain, Other (See Comments)   cramps   cramps    Other reaction(s): Muscle Pain, Other (See Comments)   cramps   cramps    cramps    Gabapentin  Other Adverse Reaction (Add comment) and Nausea/ Vomiting     Increased tremors   tremors   Other reaction(s): Other (See Comments)   Increased tremors   tremors   tremors    Other reaction(s): Other (See Comments)   Increased tremors   tremors   tremors    tremors    Levofloxacin Nausea/ Vomiting     Other reaction(s): Other (See Comments)   GI pain.   GI pain.    Other reaction(s): Other (See Comments)   GI pain.    GI pain.    Nitrofurantoin Diarrhea and Nausea/ Vomiting    Oseltamivir Diarrhea and Nausea/ Vomiting     Cramps and stomach issues   Other reaction(s): Other (See Comments)   Cramps and stomach issues    Other reaction(s): Other (See Comments)   Cramps and stomach issues    Cramps and stomach issues    Sulfamethoxazole-Trimethoprim Nausea/ Vomiting       Ob/Gyn Hx:    OB History       Gravida   2    Para   1    Term   1    Preterm        AB   1    Living   1         SAB   1    IAB        Ectopic        Multiple        Live Births                   Social History     Substance and Sexual Activity   Sexual Activity Not Currently       Social History:  Social History     Tobacco Use    Smoking status: Every Day     Packs/day: 1.00     Years: 48.00     Additional pack years: 0.00     Total pack years: 48.00     Types: Cigarettes     Start date: 1976    Smokeless tobacco: Never   Vaping Use    Vaping Use: Never used   Substance Use Topics    Alcohol use: Not Currently    Drug use: Never           Family History:  Family Medical History:       Problem Relation (Age of Onset)    Breast Cancer Maternal Grandmother, Maternal Aunt    Osteoporosis Mother    Uterine Fibroids Other              ROS: no breast pain or new or enlarging lumps on self exam, no vaginal bleeding, no discharge or pelvic pain, no hot  flashes  Constitutional:  No unexplained weight gain or loss, no loss of appetite, no fever, no night sweats or chills, no pain in jaws when eating, no scalp tenderness,  Ears nose mouth and throat:  No difficulty with hearing, no sinus problems, no runny nose, no postnasal drip, no ringing ears, no mouth sores, loose teeth, ear pain nose bleeds, sore throat facial pain or numbness.  Cardiovascular no irregular heart beats, racing heartbeat, chest pains, swelling of feet or legs, no pain  in legs with walking.  Respiratory no shortness of breath, no night sweats, no prolonged cough, wheezing, sputum production, no hemoptysis.  Gastrointestinal no heartburn, constipation, intolerance to foods, diarrhea abdominal pain, difficulty swallowing, nausea, vomiting, and no change in bowel movements.  Integument:  No rash, itching, skin lesions, change in hair.  Neurologic no frequent headaches, double vision, weakness, changes in station, problems with walking or balance, dizziness, tremor, loss of consciousness no visual loss.  Psychiatric:  No insomnia, irritability, depression, anxiety, mood swings, hallucinations.  Endocrine no intolerance to heat and cold, no frequent hunger, urination, thirst.  No changes sex drive.  Hematologic no easy bleeding, easy bruising, anemia, abnormal swollen areas.  Allergy/immunology:  No seasonal allergies, hay fever, itching, frequent infections, no exposure to HIV.    Physical Examination:  Vitals:    02/25/22 1124   BP: 124/68   Weight: 70.5 kg (155 lb 6.8 oz)   Height: 1.651 m (5\' 5" )   BMI: 25.92         Body mass index is 25.86 kg/m.      General: no acute distress  Constitutional:  Well developed, well nourished, alert and oriented x4  Skin: intact, without lesions  HEENT: NCAT, EOMI  Lungs: CTA bilaterally  CV: RRR without mrg  Abdomen: soft, NT, ND, no masses  Neurologic: alert and oriented, no focal deficits noted  Psychiatric: mood stable, normal affect, alert and oriented  x4    Gynecologic:    Breasts: supple, no masses or nodes  External Genitalia: without lesions  Urethra: no lesions  Vulva: no lesions  Vagina:  Atrophy noted, excellent apical support, no significant cystocele, grade 2 rectocele with mild widening of the genital hiatus  Cervix::  Absent  Uterus:  Absent  Adnexa:  Absent        Examination for a 67 y.o. female who presents with lower urinary tract symptoms after recent surgical therapy by Urogynecology for pelvic organ prolapse symptoms.  She had been made aware of the possibility of urinary incontinence after sacrocolpopexy and had been advised by the urogynecologist to not undergo a mid urethral sling with her initial surgical intervention due to concern for your urinary retention postoperatively.  She now has daily urine loss and also some concern for incomplete emptying/change infrequent emptying.  Would recommend she follow-up for a postvoid residual and discussion of options for further therapy.  She will follow-up in 4 weeks      ICD-10-CM    1. Lower urinary tract symptoms (LUTS)  R39.9 URINALYSIS, MACROSCOPIC AND MICROSCOPIC W/CULTURE REFLEX      2. Vaginal discharge  N89.8 VAGINITIS PATHOGENS: BACTERIAL, CANDIDA, AND TV PANEL, TMA      3. Vaginal atrophy  N95.2       4. 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

## 2022-02-26 ENCOUNTER — Other Ambulatory Visit (HOSPITAL_COMMUNITY): Payer: Self-pay | Admitting: OBSTETRICS/GYNECOLOGY

## 2022-02-26 LAB — VAGINITIS PATHOGENS: BACTERIAL, CANDIDA, AND TV PANEL, TMA
BACTERIAL VAGINOSIS: POSITIVE — AB
CANDIDA GLABRATA: NEGATIVE
CANDIDA SPECIES: NEGATIVE
TRICHOMONAS VAGINALIS: NEGATIVE

## 2022-02-26 LAB — URINE CULTURE: URINE CULTURE: <10000

## 2022-02-26 MED ORDER — METRONIDAZOLE 500 MG TABLET
500.0000 mg | ORAL_TABLET | Freq: Two times a day (BID) | ORAL | 0 refills | Status: AC
Start: 2022-02-26 — End: 2022-03-12

## 2022-03-01 ENCOUNTER — Telehealth (INDEPENDENT_AMBULATORY_CARE_PROVIDER_SITE_OTHER): Payer: Self-pay | Admitting: Neurology

## 2022-03-01 NOTE — Telephone Encounter (Signed)
Reached out to patient to verify reasoning for visit. Patient said that her tremors have been worsening. Has not seen a neurologists in years. Would like to follow up in our office. Confirmed appointment for 1/29.

## 2022-03-01 NOTE — Telephone Encounter (Signed)
Left message for patient to call back. Based on referral she was referred to Pain Management for fibromyalgia and seems as if she also has tremors.

## 2022-03-02 NOTE — Progress Notes (Signed)
[facility]  [address]  [place] [address]  Office Visit    Name: Vanessa [name]   DOB: 13-Apr-1955  Date of Service: 03/08/2022    HISTORY OF PRESENT ILLNESS:  Vanessa [name] is a 67 y.o. female R handed who presents to [facility] at [facility] for evaluation of tremor.    She has been evaluated by neurologists in the past and told she had essential tremor.  Dx 6-7 years ago, p/w tremor in hands, dropping things, it was hard for her to drive, couldn't eat with silverware (had to use hands).  Prescribed metoprolol 50 mg now up to twice daily, which has helped.  She has tremor with any fine motor tasks; even making a phone call to type the right #s, hit the correct keys;   For several years, had difficulty opening drinks (twisting a bottle cap) or a door knob ; she endorses numb/tingling sensations in both hands.  Sometimes her voice can "go away" after she talks a lot.   Caffeine: drinks 1/2 gallon of sweet tea daily (this much for maybe a year, before that, was drinking a lot of [facility])   She has a sense she has socks bunched up in her feet, has hammer toes, toes are always numb, was told she has neuropathy, she is not sure of the etiology.     Pertinent meds:   metoprolol 50 twice daily- was ordered for tremor, not for cardiac reasons.    Duloxetine 60 - for fibromyalgia, very helpful   Lyrica 100, 150 daily - for fibromyalgia, very helpful     (Gabapentin in the past was assoc w worsening of tremor)     PAST MEDICAL HISTORY:   Problem List, Medical and Surgical Histories were reviewed in the emr.    MEDICATIONS:   Reviewed in the emr.    SOCIAL HISTORY:   + tob ; rare soc etoh ; single 1 daughter     FAMILY HISTORY:   No h/o tremor     PHYSICAL EXAMINATION:     VITALS: BP 118/78   Pulse 73   Ht 1.651 m (5\' 5" )   Wt 73 kg (161 lb)   BMI 26.79 kg/m   GENERAL: appeared comfortable.  MENTAL STATUS: normal orientation, attention, language; voice is steady (can hold consonants  wtihout tremor)   CRANIAL NERVE: Full visual fields to confrontation. Extraocular movements were full. Pupils were equal and reactive to light. Face was symmetric with normal sensation. Hearing was normal to finger rub bilaterally. Tongue and palate were midline. Head turn and shoulder shrug were normal.  MOTOR: no head/tremor at rest; mild jerky/coarse postural and action tremor; Normal tone and strength in the upper extremities. Normal tone and strength in the lower extremities.; obtained handwriting sample and archimedes spiral, only slightly shaky;   SENSORY : Normal to soft touch and vibration in upper extremities. Minimal vibration loss in lower extremities. Patchy reduced sharp sensation to pinprick in feet.   COORDINATION: mild action tremor on finger-to-nose. Tremor has a jerky quality, slightly worse on L.   REFLEXES: symmetric, 2+ at biceps, brachioradialis, triceps, 2+ patella, 1+ ankles.  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  Advised regarding caffeine worsening tremor, should gradually reduce.   Advised that serotonin-boosting medications including duloxetine are known to exacerbate involuntary movements including tremor, however, if  benefit>side effect, then ok to continue.   Since inderal is more likely to help ET than metoprolol, will change to equivalent dose with hopefully better benefit for tremor.     Re: numb/tingling in hands, ? If symptom of fibromyalgia, vs neuropathy. Pt thinks she was told it was neuropathy years ago on EMG but it was maybe 7 years ago. Will clarify diagnosis with testing, if neuropathy, then will obtain screening labs for common etiologies.     PLAN:  Reduce caffeine intake to one serving daily   Change to metoprolol 50 bid to inderal 40 bid     EMG/NCV to clarify if there is edx evidence of neuropathy, or if numb/tingling is symptom of fibromyalgia.    Will schedule  return clinic visit after EMG.     Rosaria Ferries, MD, Charlynn Grimes

## 2022-03-08 ENCOUNTER — Ambulatory Visit: Payer: Medicare PPO | Attending: Neurology | Admitting: Neurology

## 2022-03-08 ENCOUNTER — Encounter (INDEPENDENT_AMBULATORY_CARE_PROVIDER_SITE_OTHER): Payer: Self-pay | Admitting: Neurology

## 2022-03-08 ENCOUNTER — Other Ambulatory Visit: Payer: Self-pay

## 2022-03-08 VITALS — BP 118/78 | HR 73 | Ht 65.0 in | Wt 161.0 lb

## 2022-03-08 DIAGNOSIS — R200 Anesthesia of skin: Secondary | ICD-10-CM | POA: Insufficient documentation

## 2022-03-08 DIAGNOSIS — I10 Essential (primary) hypertension: Secondary | ICD-10-CM

## 2022-03-08 DIAGNOSIS — G250 Essential tremor: Secondary | ICD-10-CM | POA: Insufficient documentation

## 2022-03-08 DIAGNOSIS — R202 Paresthesia of skin: Secondary | ICD-10-CM | POA: Insufficient documentation

## 2022-03-08 MED ORDER — PROPRANOLOL 40 MG TABLET
40.0000 mg | ORAL_TABLET | Freq: Two times a day (BID) | ORAL | 3 refills | Status: AC
Start: 2022-03-08 — End: ?

## 2022-03-08 NOTE — Nursing Note (Signed)
Patient presents today for tremors.

## 2022-03-10 ENCOUNTER — Other Ambulatory Visit (HOSPITAL_COMMUNITY): Payer: Self-pay | Admitting: INTERNAL MEDICINE

## 2022-03-10 DIAGNOSIS — Z13820 Encounter for screening for osteoporosis: Secondary | ICD-10-CM

## 2022-03-18 ENCOUNTER — Telehealth (INDEPENDENT_AMBULATORY_CARE_PROVIDER_SITE_OTHER): Payer: Self-pay | Admitting: Neurology

## 2022-03-18 NOTE — Telephone Encounter (Unsigned)
T/C from patient reporting she has been taking propranolol for 1 week. States propranolol is causing her to be dizzy and "shake uncontrollably" and she would like to return to using metoprolol (at higher dose if indicated).

## 2022-03-19 ENCOUNTER — Telehealth (INDEPENDENT_AMBULATORY_CARE_PROVIDER_SITE_OTHER): Payer: Self-pay | Admitting: Neurology

## 2022-03-19 NOTE — Telephone Encounter (Signed)
T/ to patient to update on the following response from Dr Laurena Spies:  Vanessa [name] to switch back to metoprolol given better efficacy for tremor, but ask her to get prescription from PCP who had been prescribing it for years before she saw me - she should focus on reducing caffeine intake. Follow-up as scheduled for EMG.   Left message on patients voicemail as she previously requested I do.

## 2022-03-19 NOTE — Telephone Encounter (Signed)
Return call from patient who verifies she received my voicemail regarding returning to metoprolol but patient states she tried Inderal today "and it was fine". States she now wants to continue with Inderal as previously suggested. Also reports she has cut back on caffeine as instructed.

## 2022-03-19 NOTE — Telephone Encounter (Signed)
Ok to switch back to metoprolol given better efficacy for tremor, but ask her to get prescription from PCP who had been prescribing it for years before she saw me - she should focus on reducing caffeine intake. Follow-up as scheduled for EMG.

## 2022-03-22 ENCOUNTER — Other Ambulatory Visit: Payer: Self-pay

## 2022-03-22 ENCOUNTER — Other Ambulatory Visit: Payer: Medicare PPO | Attending: Anesthesiology

## 2022-03-22 DIAGNOSIS — Z79891 Long term (current) use of opiate analgesic: Secondary | ICD-10-CM | POA: Insufficient documentation

## 2022-03-24 ENCOUNTER — Encounter (INDEPENDENT_AMBULATORY_CARE_PROVIDER_SITE_OTHER): Payer: Self-pay | Admitting: PHYSICIAN ASSISTANT

## 2022-03-24 ENCOUNTER — Other Ambulatory Visit: Payer: Self-pay

## 2022-03-24 ENCOUNTER — Other Ambulatory Visit: Payer: Medicare PPO | Attending: PHYSICIAN ASSISTANT | Admitting: PHYSICIAN ASSISTANT

## 2022-03-24 ENCOUNTER — Ambulatory Visit (INDEPENDENT_AMBULATORY_CARE_PROVIDER_SITE_OTHER): Payer: Medicare PPO | Admitting: PHYSICIAN ASSISTANT

## 2022-03-24 VITALS — BP 137/72 | Wt 160.3 lb

## 2022-03-24 DIAGNOSIS — N39490 Overflow incontinence: Secondary | ICD-10-CM

## 2022-03-24 MED ORDER — TAMSULOSIN 0.4 MG CAPSULE
0.4000 mg | ORAL_CAPSULE | Freq: Every day | ORAL | 0 refills | Status: DC
Start: 2022-03-24 — End: 2022-03-24

## 2022-03-24 MED ORDER — TAMSULOSIN 0.4 MG CAPSULE
0.4000 mg | ORAL_CAPSULE | Freq: Every day | ORAL | 0 refills | Status: DC
Start: 2022-03-24 — End: 2023-11-14

## 2022-03-24 NOTE — Procedures (Signed)
OB/GYN, [facility]  [address]  [place] [address]    Procedure Note    Name: Vanessa [name] MRN:  [id number]   Date: 03/24/2022 Age: 67 y.o.  DOB:   12-26-1955       Bladder Cath-ProcDoc    Performed by: Maxwell Marion, PA-C  Authorized by: Maxwell Marion, PA-C    Procedure discussed: discussed risks, benefits and alternatives    Chaperone present: yes    Anesthesia: local      Procedure Details    Procedure: catheter insertion      Position: dorsal lithotomy    Catheter insertion: non-indwelling (straight)      Inserted by: clinician    Catheter type: red rubber      Catheter size: 16 Fr    Catheter provided by: health care organization      Complexity: simple      Number of initial attempts: 1    Urethral dilation: no      Urine volume (mL): 20    Urine characteristics: clear and yellow    Post-Procedure Details     Outcome: patient tolerated procedure well with no complications      Additional Details      Initial void of 48 ccs with PVR 20 ccs. Patient was evaluated 02/25/22 with complaints of difficulty urinating following recent urogynecologic medication. She is s/p TLH/BSO/laparoscopic sacrocolpopexy on 04/22/21 performed by previous provider in [place]. Her main concern is feeling of insufficient emptying of her bladder/decreased frequency of urination throughout the day. She reports she wakes 2-3 times nightly to empty her bladder. She reports leaking with C/L/S, activity. On examination in January she was found to have atrophy, excellent apical support, no significant cystocele, grade 2 rectocele with mild widening of the genital hiatus. She reports she has had 1-2 bottles of water to drink total today.     Discussed tx plan of Flomax 0.4 mg daily. Patient is amenable to start this medication. Sent to pharmacy. R/B/A reviewed. Plan for 8 week f/u/med check.     Maxwell Marion, PA-C   [facility] Medicine Department of Obstetrics and Gynecology         I reviewed the APP's note.  I agree with the findings and  plan of care as documented in the APP's note.  Any exceptions/additions are edited/noted.    Holley Bouche, MD

## 2022-03-25 LAB — DRUG ABUSE SCREEN 7, WITH CONFIRMATION, SERUM (QUEST)
Amphetamine Screen, Serum: NEGATIVE
Barbiturates: NEGATIVE
Benzodiazepines: NEGATIVE
Cocaine Metabolites: NEGATIVE
MARIJUANA: NEGATIVE
Opiates: NEGATIVE
PCP (Phencyclidine): NEGATIVE

## 2022-03-25 LAB — URINE CULTURE: URINE CULTURE: NO GROWTH

## 2022-03-30 ENCOUNTER — Inpatient Hospital Stay
Admission: RE | Admit: 2022-03-30 | Discharge: 2022-03-30 | Disposition: A | Discharge status: Home / Self Care | Payer: Medicare PPO | Source: Ambulatory Visit | Attending: INTERNAL MEDICINE | Admitting: INTERNAL MEDICINE

## 2022-03-30 ENCOUNTER — Other Ambulatory Visit: Payer: Self-pay

## 2022-03-30 DIAGNOSIS — Z13820 Encounter for screening for osteoporosis: Secondary | ICD-10-CM | POA: Insufficient documentation

## 2022-05-05 ENCOUNTER — Encounter (INDEPENDENT_AMBULATORY_CARE_PROVIDER_SITE_OTHER): Payer: Self-pay | Admitting: PHYSICIAN ASSISTANT

## 2022-05-05 ENCOUNTER — Ambulatory Visit (INDEPENDENT_AMBULATORY_CARE_PROVIDER_SITE_OTHER): Payer: Medicare (Managed Care) | Admitting: PHYSICIAN ASSISTANT

## 2022-05-05 ENCOUNTER — Other Ambulatory Visit: Payer: Self-pay

## 2022-05-05 VITALS — BP 126/74 | Wt 156.7 lb

## 2022-05-05 DIAGNOSIS — R399 Unspecified symptoms and signs involving the genitourinary system: Secondary | ICD-10-CM

## 2022-05-05 NOTE — Progress Notes (Signed)
GYN Follow Up Visit:     Chief Complaint: Med check    HPI: Vanessa [name] is a 67 y.o. G2P1011 who presents to the office for med check. She was initially evaluated in January 2024 for complaints of LUTS s/p TLH/BSO/laparoscopic sacrocolpopexy on 04/22/21 at outside facility. She presented as scheduled in February 2024 for PVR with an initial void of 48 ccs with PVR 20 ccs by straight catheterization. Therapy was initiated with Flomax 0.4 mg daily. She presents today in f/u. She reports mild improvement in her symptoms, and feels they are now tolerable. She would like to give the new medication additional time to prove effective.     ROS: Negative except as mentioned in the HPI     Past Medical History:   Diagnosis Date    Allergic rhinitis     Depression     Disorder of thyroid     Fibromyalgia     Osteoarthritis     TMJ (dislocation of temporomandibular joint)     Tremors of nervous system     Vaginal infection     recurrent bv    Varicosities of leg      Past Surgical History:   Procedure Laterality Date    ABDOMINAL SACROCOLPOPEXY N/A 04/22/2021    at [place]  04/22/2021    HX TLHBSO Bilateral 04/22/2021    TLH/BSO    VARICOSE VEIN SURGERY       Family Medical History:       Problem Relation (Age of Onset)    Breast Cancer Maternal Grandmother, Maternal Aunt    Osteoporosis Mother    Uterine Fibroids Other          Social History     Tobacco Use    Smoking status: Every Day     Current packs/day: 1.00     Average packs/day: 1 pack/day for 48.2 years (48.2 ttl pk-yrs)     Types: Cigarettes     Start date: 1976    Smokeless tobacco: Never   Vaping Use    Vaping status: Never Used   Substance Use Topics    Alcohol use: Not Currently    Drug use: Never     Allergies   Allergen Reactions    Nabumetone Rash     itching   itching   itching   itching    itching   itching    itching    Tramadol  Other Adverse Reaction (Add comment)     headaches   headaches   headaches    headaches    headaches   headaches    headaches    Trazodone     Erythromycin Diarrhea and  Other Adverse Reaction (Add comment)     cramps   Other reaction(s): Muscle Pain, Other (See Comments)   cramps   cramps    Other reaction(s): Muscle Pain, Other (See Comments)   cramps   cramps    cramps    Gabapentin  Other Adverse Reaction (Add comment) and Nausea/ Vomiting     Increased tremors   tremors   Other reaction(s): Other (See Comments)   Increased tremors   tremors   tremors    Other reaction(s): Other (See Comments)   Increased tremors   tremors   tremors    tremors    Levofloxacin Nausea/ Vomiting     Other reaction(s): Other (See Comments)   GI pain.   GI pain.  Other reaction(s): Other (See Comments)   GI pain.    GI pain.    Nitrofurantoin Diarrhea and Nausea/ Vomiting    Oseltamivir Diarrhea and Nausea/ Vomiting     Cramps and stomach issues   Other reaction(s): Other (See Comments)   Cramps and stomach issues    Other reaction(s): Other (See Comments)   Cramps and stomach issues    Cramps and stomach issues    Sulfamethoxazole-Trimethoprim Nausea/ Vomiting     Current Outpatient Medications   Medication Sig    acetaminophen-codeine (TYLENOL #3) 300-30 mg Oral Tablet Take 1 Tablet by mouth    amLODIPine (NORVASC) 5 mg Oral Tablet Take 1 Tablet (5 mg total) by mouth Once a day    cholecalciferol, vitamin D3, 25 mcg (1,000 unit) Oral Tablet Take 1 Tablet (1,000 Units total) by mouth    cyclobenzaprine (FLEXERIL) 10 mg Oral Tablet TAKE 1 TABLET BY MOUTH THREE TIMES DAILY FOR SPASMS    DULoxetine (CYMBALTA DR) 60 mg Oral Capsule, Delayed Release(E.C.) Take 1 Capsule (60 mg total) by mouth Once a day    estradioL (ESTRACE) 0.01 % (0.1 mg/gram) Vaginal Cream Insert 0.5 g into the vagina    etodolac (LODINE) 300 mg Oral Capsule Take 1 Capsule (300 mg total) by mouth Twice daily    famotidine (PEPCID) 10 mg Oral Tablet Take 2 Tablets (20 mg total) by mouth    levothyroxine (SYNTHROID) 50 mcg Oral Tablet TAKE 1  TABLET BY MOUTH EVERY MORNING 30 MINUTES BEFORE BREAKFAST    levothyroxine (SYNTHROID) 75 mcg Oral Tablet PLEASE SEE ATTACHED FOR DETAILED DIRECTIONS    magnesium oxide (MAG-OX) 400 mg Oral Tablet Take 1 Tablet (400 mg total) by mouth    multivitamin Oral Tablet Take 1 Tablet by mouth    nystatin (MYCOSTATIN) 100,000 unit/mL Oral Suspension     pregabalin (LYRICA) 100 mg Oral Capsule TAKE 1 CAPSULE BY MOUTH EVERY MORNING AND 1 CAPSULE EVERY NIGHT    pregabalin (LYRICA) 50 mg Oral Capsule Take 3 Capsules (150 mg total) by mouth Every night    propranoloL (INDERAL) 40 mg Oral Tablet Take 1 Tablet (40 mg total) by mouth Twice daily    tamsulosin (FLOMAX) 0.4 mg Oral Capsule Take 1 Capsule (0.4 mg total) by mouth Once a day    triamcinolone acetonide 0.1 % Ointment APPLY TWICE DAILY TO AFFECTED AREA FOR TWO WEEKS, TAKE A TWO WEEK BREAK AND REPEAT AS NEEDED.    trimethoprim-sulfamethoxazole (BACTRIM DS) 160-800mg  per tablet Take 1 Tablet (160 mg total) by mouth Twice daily    zolpidem (AMBIEN) 10 mg Oral Tablet Take 1 Tablet (10 mg total) by mouth Once a day     OB History       Gravida   2    Para   1    Term   1    Preterm        AB   1    Living   1         SAB   1    IAB        Ectopic        Multiple        Live Births                   General Physical Exam:   General: 67 y.o. female in NAD   Neuro: A&O   Psych: Appropriate mood and affect    GYN Exam:  Deferred  Assessment and Plan:  67 y.o. [id number] who presents in follow up   Continue Flomax at current dose. Plan for 6 week f/u for med check and initiation of additional intervention as indicated.  RTO as scheduled    Maxwell Marion, [facility] Department of Obstetrics and Gynecology     I saw and examined the patient independently of my supervising physician. I performed the history and physical examination and formulated the assessment and plan. A physician of the practice was immediately available to me at the time I evaluated the patient.        I  reviewed the APP's note.  I agree with the findings and plan of care as documented in the APP's note.  Any exceptions/additions are edited/noted.    Holley Bouche, MD

## 2022-05-20 ENCOUNTER — Other Ambulatory Visit: Payer: Self-pay

## 2022-05-20 ENCOUNTER — Telehealth (INDEPENDENT_AMBULATORY_CARE_PROVIDER_SITE_OTHER): Payer: Self-pay | Admitting: Neurology

## 2022-05-20 ENCOUNTER — Ambulatory Visit: Payer: Medicare (Managed Care) | Attending: Neurology | Admitting: Neurology

## 2022-05-20 DIAGNOSIS — R200 Anesthesia of skin: Secondary | ICD-10-CM

## 2022-05-20 DIAGNOSIS — R202 Paresthesia of skin: Secondary | ICD-10-CM

## 2022-05-20 NOTE — Progress Notes (Signed)
[address] [place]  [address]  [place] [address]  [phone]    EMG/NCS PROCEDURE NOTE    Patient Name: Vanessa [name], Vanessa [name]  MRN: [id number]   Date of Birth:  [birth year]       Procedure Date:  05/20/2022       History & Exam:    Vanessa [name] is a 67 y.o. female R handed with h/o essential tremor and fibromyalgia.  Repeat EMG/NCV is performed of the R UE and LE to evaluate numb/tingling sensations in the limbs.    Description of Findings:  The following nerve conduction studies had normal responses:    Right sural, superficial peroneal, median, ulnar, radial sensory nerves and normal latencies, amplitudes, conduction velocities.   Right peroneal, tibial, median, and ulnar motor nerves had normal latencies, amplitudes, conduction velocities and F wave latencies.    A concentric needle exam was performed in the R UE and LE.  Insertional and spontaneous activity was normal.  Motor unit action potentials were all within normal limits.     Diagnostic Interpretation:   This is a normal study of the R upper and lower extremities.     (There is no electrophysiologic evidence of peripheral nerve or muscle disorder.)        Please see attached (scanned) document for additional patient information, tabular NCS and EMG data, and NCS waveforms.    Strict temperature control was maintained during the study with upper extremity temp >32 C, lower extremity temp > 30 C.     Gwynne Edinger, MD    05/20/2022

## 2022-05-20 NOTE — Telephone Encounter (Signed)
Pt in office for EMG.  Reports good response in tremor with change from metoprolol to inderal 40 mg twice daily.  (She can now use her phone and not be worried she'll click on the wrong button and order something accidentally!)  Will get future inderal refills from PCP, and can return prn to neurology.

## 2022-07-01 ENCOUNTER — Ambulatory Visit (INDEPENDENT_AMBULATORY_CARE_PROVIDER_SITE_OTHER): Payer: Self-pay | Admitting: OBSTETRICS/GYNECOLOGY

## 2022-07-06 ENCOUNTER — Other Ambulatory Visit (INDEPENDENT_AMBULATORY_CARE_PROVIDER_SITE_OTHER): Payer: Self-pay | Admitting: Neurology

## 2022-07-07 ENCOUNTER — Other Ambulatory Visit (INDEPENDENT_AMBULATORY_CARE_PROVIDER_SITE_OTHER): Payer: Self-pay | Admitting: Neurology

## 2022-07-08 NOTE — Telephone Encounter (Signed)
To get refills from PCP

## 2022-10-14 ENCOUNTER — Other Ambulatory Visit (HOSPITAL_COMMUNITY): Payer: Self-pay | Admitting: NURSE PRACTITIONER

## 2022-10-14 DIAGNOSIS — F17210 Nicotine dependence, cigarettes, uncomplicated: Secondary | ICD-10-CM

## 2022-10-14 DIAGNOSIS — Z122 Encounter for screening for malignant neoplasm of respiratory organs: Secondary | ICD-10-CM

## 2022-10-27 ENCOUNTER — Other Ambulatory Visit (HOSPITAL_COMMUNITY): Payer: Self-pay | Admitting: INTERNAL MEDICINE

## 2022-10-27 DIAGNOSIS — Z1231 Encounter for screening mammogram for malignant neoplasm of breast: Secondary | ICD-10-CM

## 2022-11-02 ENCOUNTER — Telehealth (HOSPITAL_BASED_OUTPATIENT_CLINIC_OR_DEPARTMENT_OTHER): Payer: Self-pay

## 2022-11-02 NOTE — Telephone Encounter (Signed)
Reason for referral: fibromyalgia    Referred by office of: Miguel Aschoff    Records including: referral, demo, ins, labs, office ntoe    Referral imported into media.     Routed to providers to review    Mayme Genta

## 2022-12-06 ENCOUNTER — Inpatient Hospital Stay
Admission: RE | Admit: 2022-12-06 | Discharge: 2022-12-06 | Disposition: A | Discharge status: Home / Self Care | Payer: Medicare (Managed Care) | Source: Ambulatory Visit | Attending: NURSE PRACTITIONER | Admitting: NURSE PRACTITIONER

## 2022-12-06 ENCOUNTER — Other Ambulatory Visit: Payer: Self-pay

## 2022-12-06 DIAGNOSIS — Z122 Encounter for screening for malignant neoplasm of respiratory organs: Secondary | ICD-10-CM | POA: Insufficient documentation

## 2022-12-06 DIAGNOSIS — F17210 Nicotine dependence, cigarettes, uncomplicated: Secondary | ICD-10-CM | POA: Insufficient documentation

## 2022-12-07 ENCOUNTER — Other Ambulatory Visit (HOSPITAL_COMMUNITY): Payer: Self-pay | Admitting: NURSE PRACTITIONER

## 2022-12-07 ENCOUNTER — Encounter (HOSPITAL_BASED_OUTPATIENT_CLINIC_OR_DEPARTMENT_OTHER): Payer: Self-pay | Admitting: NURSE PRACTITIONER

## 2022-12-07 DIAGNOSIS — Z122 Encounter for screening for malignant neoplasm of respiratory organs: Secondary | ICD-10-CM

## 2022-12-07 DIAGNOSIS — F17210 Nicotine dependence, cigarettes, uncomplicated: Secondary | ICD-10-CM

## 2023-01-11 ENCOUNTER — Telehealth (HOSPITAL_BASED_OUTPATIENT_CLINIC_OR_DEPARTMENT_OTHER): Payer: Self-pay

## 2023-01-11 NOTE — Telephone Encounter (Signed)
Received referral. Reviewed by provider and deferred for below. Letter sent to referring.    We are not currently able to accept referrals for chronic pain or fibromyalgia at this time.     Vanessa [name]

## 2023-02-18 ENCOUNTER — Ambulatory Visit (HOSPITAL_BASED_OUTPATIENT_CLINIC_OR_DEPARTMENT_OTHER): Payer: Self-pay

## 2023-02-25 ENCOUNTER — Other Ambulatory Visit: Payer: Self-pay

## 2023-02-25 ENCOUNTER — Encounter (HOSPITAL_BASED_OUTPATIENT_CLINIC_OR_DEPARTMENT_OTHER): Payer: Self-pay

## 2023-02-25 ENCOUNTER — Ambulatory Visit
Admission: RE | Admit: 2023-02-25 | Discharge: 2023-02-25 | Disposition: A | Discharge status: Home / Self Care | Payer: Medicare (Managed Care) | Source: Ambulatory Visit | Attending: INTERNAL MEDICINE

## 2023-02-25 DIAGNOSIS — Z1231 Encounter for screening mammogram for malignant neoplasm of breast: Secondary | ICD-10-CM | POA: Insufficient documentation

## 2023-03-03 DIAGNOSIS — Z1231 Encounter for screening mammogram for malignant neoplasm of breast: Secondary | ICD-10-CM

## 2023-04-28 ENCOUNTER — Other Ambulatory Visit: Attending: OBSTETRICS/GYNECOLOGY | Admitting: OBSTETRICS/GYNECOLOGY

## 2023-04-28 ENCOUNTER — Encounter (INDEPENDENT_AMBULATORY_CARE_PROVIDER_SITE_OTHER): Payer: Self-pay | Admitting: OBSTETRICS/GYNECOLOGY

## 2023-04-28 ENCOUNTER — Ambulatory Visit (INDEPENDENT_AMBULATORY_CARE_PROVIDER_SITE_OTHER): Payer: Self-pay | Admitting: OBSTETRICS/GYNECOLOGY

## 2023-04-28 ENCOUNTER — Other Ambulatory Visit: Payer: Self-pay

## 2023-04-28 VITALS — BP 126/84 | Wt 149.2 lb

## 2023-04-28 DIAGNOSIS — N760 Acute vaginitis: Secondary | ICD-10-CM

## 2023-04-28 DIAGNOSIS — N816 Rectocele: Secondary | ICD-10-CM

## 2023-04-28 DIAGNOSIS — R399 Unspecified symptoms and signs involving the genitourinary system: Secondary | ICD-10-CM | POA: Insufficient documentation

## 2023-04-28 MED ORDER — METRONIDAZOLE 0.75 % (37.5 MG/5 GRAM) VAGINAL GEL
1.0000 | Freq: Every evening | VAGINAL | 5 refills | Status: DC
Start: 2023-04-28 — End: 2023-11-07

## 2023-04-28 NOTE — Progress Notes (Signed)
 Progress Note      Chief Complaint:  This is a 68 y.o. White female who is a G2P1011 that presents for follow-up with her urinary and pelvic complaints along with recurrent BV.    HPI:  The patient is a 68 year old Caucasian female gravida 2 para 1011 who previously underwent total laparoscopic hysterectomy, bilateral salpingo oophorectomy, laparoscopic sacral colpopexy on April 22, 2021.  This was performed in [place]  in they had not performed a mid urethral sling at that time due to concern for potential urinary retention.  She has a history of fibromyalgia as well as bladder dysfunction.  She has continued to have leaking of urine with stress symptoms such as cough, laugh, sneeze or rapid movements and lifting.  Simple cystometric examination had revealed overflow incontinence and we had placed her on Flomax  0.4 mg she noted that her urinary hesitancy had improved but she still had some leaking with cough laugh and sneeze.  She had missed her follow up appointment with the medication.  She is now interested in considering the mid urethral sling or further appropriate therapy for her urinary symptoms.    The patient was also noted to have a grade 2 rectocele at that time and is considering repair of this if we would proceed with surgical intervention for the rectocele and perineal laxity.    The patient has had a long history of recurrent bacterial vaginosis treated both in [place]  as well as by her primary care provider.  After initial treatment she will then note some occasional urinary symptoms but currently right now she only has the odor and discharge.  She finds that the wetness with her urine leaking and use of pads increases her opportunity for recurrent symptoms.    Last Mammogram:    Recent Results (from the past 09811 hours)   MAMMO BILATERAL SCREENING-ADDL VIEWS/BREAST US  AS REQ BY RAD    Collection Time: 02/25/23 10:14 AM    Narrative    Computer aided detection (CAD) technology has been  applied to the standard   (CC and MLO) images.    3D tomosynthesis and 2D images were acquired and reviewed.    FILMS COMPARED:  Compared to: 11/04/2021 MAMMO BILATERAL SCREENING-ADDL VIEWS/BREAST US  AS   REQ BY RAD, 09/18/2020 MAMMO BILATERAL SCREENING-ADDL VIEWS/BREAST US  AS   REQ BY RAD, 09/18/2020 EXTERNAL MAMMOGRAM COMPARISON IMAGES, and   07/16/2019 MAMMO BILATERAL SCREENING-ADDL VIEWS/BREAST US  AS REQ BY RAD    HISTORY:  Procedure: MAMMO BILATERAL SCREENING W TOMO-ADDL VIEWS/BREAST US  AS REQ BY   RAD  Reason for exam: Encounter for screening mammogram for malignant neoplasm   of breast      FINDINGS:  The breasts are heterogeneously dense, which may obscure small masses.    Bilateral  There is no evidence of suspicious masses, calcifications, or other   abnormal findings.      Impression    Follow up mammogram in 1 year is recommended for both breasts.    BI-RADS ATLAS category (overall): 1 - Negative      [facility]   [place], [place] [address]  [phone]    Radiologist location ID: [id number]     Last DXA:    Recent Results (from the past [id number] hours)   DEXA BONE DENSITOMETRY    Collection Time: 03/30/22 12:27 PM    Narrative    INDICATION:  Osteoporosis screening.  Postmenopausal.  Additional History:  Height loss of 1.5 inches.  HRT.  Smoker.  Levothyroxine.  Calcium.  Hysterectomy.    TECHNIQUE:  A dual energy x-ray absorptiometry (DXA) of the lumbar spine and left hip.    Hologic Discovery Ci; Version 11%03d.31%03d.27%03d.8%05d.    COMPARISON:  None.    FINDINGS:  Bone mineral density of the lumbar spine is 1.033 g/cm2 with a T-score of -0.1 and a Z-score of 1.6.      Bone mineral density of the left femoral neck is 0.646 g/cm2 with a T-score of -1.8 and a Z-score of -0.3.  Bone mineral density of the left hip is 0.781 g/cm2 with a T-score of -1.3 and a Z-score of -0.1.        Impression    Based on the lowest T-score of -1.8 in the left femoral neck, fracture risk is  increased; WHO classification low bone density.    FRAX Score  10-year fracture risk for major osteoporotic fracture:  10% based on the values obtained in the left hip.  10-year fracture risk for hip fracture:  2.3%    RECOMMENDATION:  Follow-up in 2 years, or as clinically warranted.          Signed by Felicitas Horse, DO          Past Medical History:  All were reviewed and updated in the EMR.  Past Surgical History:   Procedure Laterality Date    ABDOMINAL SACROCOLPOPEXY N/A 04/22/2021    at [facility]    HX HYSTERECTOMY  04/22/2021    HX TLHBSO Bilateral 04/22/2021    TLH/BSO    VARICOSE VEIN SURGERY            Past Medical History:   Diagnosis Date    Allergic rhinitis     Depression     Disorder of thyroid      Fibromyalgia     Osteoarthritis     TMJ (dislocation of temporomandibular joint)     Tremors of nervous system     Vaginal infection     recurrent bv    Varicosities of leg          Current Outpatient Medications   Medication Sig    acetaminophen-codeine (TYLENOL #3) 300-30 mg Oral Tablet Take 1 Tablet by mouth    amLODIPine (NORVASC) 5 mg Oral Tablet Take 1 Tablet (5 mg total) by mouth Daily    cholecalciferol, vitamin D3, 25 mcg (1,000 unit) Oral Tablet Take 1 Tablet (1,000 Units total) by mouth    cyclobenzaprine (FLEXERIL) 10 mg Oral Tablet TAKE 1 TABLET BY MOUTH THREE TIMES DAILY FOR SPASMS (Patient not taking: Reported on 04/28/2023)    DULoxetine (CYMBALTA DR) 60 mg Oral Capsule, Delayed Release(E.C.) Take 1 Capsule (60 mg total) by mouth Daily    estradioL (ESTRACE) 0.01 % (0.1 mg/gram) Vaginal Cream Insert 0.5 g into the vagina (Patient not taking: Reported on 04/28/2023)    etodolac (LODINE) 300 mg Oral Capsule Take 1 Capsule (300 mg total) by mouth Twice daily (Patient not taking: Reported on 04/28/2023)    famotidine (PEPCID) 10 mg Oral Tablet Take 2 Tablets (20 mg total) by mouth    levothyroxine (SYNTHROID) 50 mcg Oral Tablet TAKE 1 TABLET BY MOUTH EVERY MORNING 30 MINUTES BEFORE BREAKFAST  (Patient not taking: Reported on 04/28/2023)    levothyroxine (SYNTHROID) 75 mcg Oral Tablet PLEASE SEE ATTACHED FOR DETAILED DIRECTIONS    magnesium oxide (MAG-OX) 400 mg Oral Tablet Take 1 Tablet (400 mg total) by mouth    metroNIDAZOLE  (METROGEL -VAGINAL) 0.75 % (37.5mg /5  gram) Vaginal Gel Insert 1 Applicator into the vagina Every night Use one applicator intravaginally nightly for 5 nights, then 1 applicator weekly for 6 months.    multivitamin Oral Tablet Take 1 Tablet by mouth    nystatin (MYCOSTATIN) 100,000 unit/mL Oral Suspension     pregabalin (LYRICA) 100 mg Oral Capsule TAKE 1 CAPSULE BY MOUTH EVERY MORNING AND 1 CAPSULE EVERY NIGHT    pregabalin (LYRICA) 50 mg Oral Capsule Take 3 Capsules (150 mg total) by mouth Every night    propranoloL  (INDERAL ) 40 mg Oral Tablet Take 1 Tablet (40 mg total) by mouth Twice daily    tamsulosin  (FLOMAX ) 0.4 mg Oral Capsule Take 1 Capsule (0.4 mg total) by mouth Once a day    triamcinolone acetonide 0.1 % Ointment APPLY TWICE DAILY TO AFFECTED AREA FOR TWO WEEKS, TAKE A TWO WEEK BREAK AND REPEAT AS NEEDED.    trimethoprim-sulfamethoxazole (BACTRIM DS) 160-800mg  per tablet Take 1 Tablet (160 mg total) by mouth Twice daily    zolpidem (AMBIEN) 10 mg Oral Tablet Take 1 Tablet (10 mg total) by mouth Daily     Allergies   Allergen Reactions    Nabumetone Rash     itching   itching   itching   itching    itching   itching    itching    Tramadol  Other Adverse Reaction (Add comment)     headaches   headaches   headaches   headaches    headaches   headaches    headaches    Trazodone     Erythromycin Diarrhea and  Other Adverse Reaction (Add comment)     cramps   Other reaction(s): Muscle Pain, Other (See Comments)   cramps   cramps    Other reaction(s): Muscle Pain, Other (See Comments)   cramps   cramps    cramps    Gabapentin  Other Adverse Reaction (Add comment) and Nausea/ Vomiting     Increased tremors   tremors   Other reaction(s): Other (See Comments)   Increased tremors    tremors   tremors    Other reaction(s): Other (See Comments)   Increased tremors   tremors   tremors    tremors    Levofloxacin Nausea/ Vomiting     Other reaction(s): Other (See Comments)   GI pain.   GI pain.    Other reaction(s): Other (See Comments)   GI pain.    GI pain.    Nitrofurantoin Diarrhea and Nausea/ Vomiting    Oseltamivir Diarrhea and Nausea/ Vomiting     Cramps and stomach issues   Other reaction(s): Other (See Comments)   Cramps and stomach issues    Other reaction(s): Other (See Comments)   Cramps and stomach issues    Cramps and stomach issues    Sulfamethoxazole-Trimethoprim Nausea/ Vomiting       Ob/Gyn Hx:    OB History       Gravida   2    Para   1    Term   1    Preterm        AB   1    Living   1         SAB   1    IAB        Ectopic        Multiple        Live Births  Social History     Substance and Sexual Activity   Sexual Activity Not Currently    Birth control/protection: Surgical       Social History:  Social History     Tobacco Use    Smoking status: Every Day     Current packs/day: 1.00     Average packs/day: 1 pack/day for 49.2 years (49.2 ttl pk-yrs)     Types: Cigarettes     Start date: 1976    Smokeless tobacco: Never   Vaping Use    Vaping status: Never Used   Substance Use Topics    Alcohol use: Not Currently    Drug use: Never       Family History:  Family Medical History:       Problem Relation (Age of Onset)    Breast Cancer Maternal Grandmother, Maternal Aunt    No Known Problems Father, Sister, Brother, Maternal Grandfather, Paternal Grandmother, Paternal Grandfather, Daughter, Son, Maternal Uncle, Paternal Aunt, Paternal Uncle    Osteoporosis Mother    Uterine Fibroids Other            ROS:   Constitutional:  No unexplained weight gain or loss, no loss of appetite, no fever, no night sweats or chills, no pain in jaws when eating, no scalp tenderness,  Ears nose mouth and throat:  No difficulty with hearing, no sinus problems, no runny nose, no postnasal  drip, no ringing ears, no mouth sores, loose teeth, ear pain nose bleeds, sore throat facial pain or numbness.  Cardiovascular no irregular heart beats, racing heartbeat, chest pains, swelling of feet or legs, no pain in legs with walking.  Respiratory no shortness of breath, no night sweats, no prolonged cough, wheezing, sputum production, no hemoptysis.  Gastrointestinal no heartburn, constipation, intolerance to foods, diarrhea abdominal pain, difficulty swallowing, nausea, vomiting, and no change in bowel movements.  Integument:  No rash, itching, skin lesions, change in hair.  Neurologic no frequent headaches, double vision, weakness, changes in station, problems with walking or balance, dizziness, tremor, loss of consciousness no visual loss.  Psychiatric:  No insomnia, irritability, depression, anxiety, mood swings, hallucinations.  Endocrine no intolerance to heat and cold, no frequent hunger, urination, thirst.  No changes sex drive.  Hematologic no easy bleeding, easy bruising, anemia, abnormal swollen areas.  Allergy/immunology:  No seasonal allergies, hay fever, itching, frequent infections, no exposure to HIV.    Physical Examination:  Vitals:    04/28/23 0748   BP: 126/84   Weight: 67.7 kg (149 lb 3.7 oz)         Body mass index is 24.83 kg/m.      General: no acute distress  Constitutional:  Well developed, well nourished, alert and oriented x4  Skin: intact, without lesions  HEENT: NCAT, EOMI  Abdomen: soft, NT, ND, no masses  Neurologic: alert and oriented, no focal deficits noted  Psychiatric: mood stable, normal affect, alert and oriented x4    Gynecologic:    External Genitalia: without lesions  Urethra: no lesions  Vulva: no lesions  Vagina:  Grade 2 rectocele, perineal laxity, some loss of urine with Valsalva/provocative challenge, white discharge with odor consistent with her history of recurrent BV  Cervix::  Absent, good apical support  Uterus:  Absent  Adnexa:  Absent      Examination for  a 68 y.o. female who presents with recurrent vaginitis with multiple episodes of recurrent BV treated by her primary care provider as well as previous evaluations in  [place] .  The patient will now begin Metrogel  vaginal for the next week and then weekly applications to reduce the risk of recurrence for the next 3-6 months.  Will follow up with her symptoms at her next evaluation.    With her continued lower urinary tract symptoms and previous evidence of urinary retention will now follow up with complex urodynamic testing then consider further therapy with the understanding that is since she has had a history of retention both initially after her sacrocolpopexy and with the findings at her simple cystometric exam and improvement in those symptoms with the use of the Flomax  she would be at very high-risk for urinary retention after mid urethral sling which would place her at risk for the need for self catheterization.  Will follow up with results of the complex urodynamic testing then discuss these opportunities further    Concerning her rectocele.  She has declined pessary in the past and would like to consider surgical therapy if she would proceed with mid urethral sling.  She is aware that her previous surgery would not have addressed the rectocele.      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 Lower urinary tract symptoms (LUTS)  R39.9 URINE CULTURE      3. Recurrent vaginitis  N76.0            Orders Placed This Encounter    URINE CULTURE    metroNIDAZOLE  (METROGEL -VAGINAL) 0.75 % (37.5mg /5 gram) Vaginal Gel     Follow-up with complex cystometric examination and then consider further therapy.    Hobson Luna, MD

## 2023-04-29 ENCOUNTER — Encounter (INDEPENDENT_AMBULATORY_CARE_PROVIDER_SITE_OTHER): Payer: Self-pay

## 2023-05-01 LAB — URINE CULTURE: URINE CULTURE: 35000

## 2023-05-02 ENCOUNTER — Ambulatory Visit (INDEPENDENT_AMBULATORY_CARE_PROVIDER_SITE_OTHER): Payer: Self-pay | Admitting: OBSTETRICS/GYNECOLOGY

## 2023-05-02 ENCOUNTER — Ambulatory Visit (HOSPITAL_COMMUNITY): Payer: Self-pay | Admitting: OBSTETRICS/GYNECOLOGY

## 2023-05-02 NOTE — Nursing Note (Signed)
 Hobson Luna, MD sent to Marcene Serve, RN  Good morning,  I had reviewed scheduling this patient at [facility] for complex urodynamic testing since it is not available in [place] office with the staff at the day of her appointment.  They were to call Redmond Candle, RN that does the scheduling for this testing at the [facility].  If they have not schedule the patient I would appreciate you making a call to Alisa App so we can have her procedure scheduled.  She is not able to consider any surgical procedure until this testing has been completed.  Thank you,  Dr. Heinz Llano at [facility] is working on getting this scheduled   Marcene Serve, RN  05/02/2023 13:20

## 2023-05-02 NOTE — Telephone Encounter (Signed)
-----   Message from Hobson Luna, MD sent at 05/02/2023 11:04 AM EDT -----  Regarding: RE: New Appointment  ----- Message from [place] E sent at 05/02/2023  9:44 AM EDT -----  Copied From CRM #[id number].  Kendal Pauling (Self) called to schedule an appointment.     Pt last saw Hobson Luna, MD    Hello,  Pt stated was told would have to have a test done in [place] but not sure what it is called. Currently see no orders.  Pt stated can be reached at  [phone] pt stated to leave a voice message due to phone will not ring thru with smap.    Thank you  Racheal

## 2023-05-06 ENCOUNTER — Ambulatory Visit (INDEPENDENT_AMBULATORY_CARE_PROVIDER_SITE_OTHER): Payer: Self-pay | Admitting: OBSTETRICS/GYNECOLOGY

## 2023-05-06 DIAGNOSIS — R399 Unspecified symptoms and signs involving the genitourinary system: Secondary | ICD-10-CM

## 2023-05-06 NOTE — Addendum Note (Signed)
 Addended by: Redmond Candle on: 05/06/2023 03:42 PM     Modules accepted: Orders

## 2023-05-06 NOTE — Telephone Encounter (Signed)
-----   Message from Karna Pacas, [place] sent at 05/04/2023  1:42 PM EDT -----  Regarding: FW: New Appointment  ----- Message from Audrene Blessing sent at 05/04/2023  1:36 PM EDT -----  Illene Malm pt    Pt is asking to speak with nurse regarding getting testing scheduled. Pt can be reached at- [phone] (M) and will have to lvm (calls won't come through). Pt states will call back. Please advise.    Thank you,  Galvin Jules    ----- Message from [place] E sent at 05/02/2023  9:44 AM EDT -----  Copied From CRM #[id number].  Kendal Pauling (Self) called to schedule an appointment.     Pt last saw Hobson Luna, MD    Hello,  Pt stated was told would have to have a test done in [place] but not sure what it is called. Currently see no orders.  Pt stated can be reached at  [phone] pt stated to leave a voice message due to phone will not ring thru with smap.    Thank you  Racheal

## 2023-05-06 NOTE — Telephone Encounter (Signed)
 Pt returned call to clinic and agreeable to scheduling CMG on 4/29 at 10:00. Pt aware she will need to provide urine sample 4/25 at any [facility] clinic. Orders placed.  Redmond Candle, RN, 05/06/2023 15:42

## 2023-05-06 NOTE — Telephone Encounter (Signed)
 Regarding: Returning Call  ----- Message from Lonia Ro sent at 05/06/2023 11:19 AM EDT -----  Copied From CRM #[id number].  [name], Vanessa L () is returning a call they received from [place].    Glad Pt  She states she cannot come on April 15 for the CMG as she has another appt.     She is asking for an appt date to be left in her VM if she doesn't answer due to phone issues.     Thank You  Paula McMillen

## 2023-05-06 NOTE — Telephone Encounter (Signed)
 Left VM for pt to schedule CMG testing in [place]. Will offer April 15 at 11:00.     Redmond Candle, RN, 05/06/2023 08:05

## 2023-05-06 NOTE — Telephone Encounter (Signed)
 Left VM to offer CMG scheduling on 4/29 at 10:00 in [place].    Redmond Candle, RN, 05/06/2023 11:52

## 2023-06-03 ENCOUNTER — Other Ambulatory Visit: Payer: Self-pay

## 2023-06-03 ENCOUNTER — Ambulatory Visit: Attending: OBSTETRICS/GYNECOLOGY

## 2023-06-03 DIAGNOSIS — R399 Unspecified symptoms and signs involving the genitourinary system: Secondary | ICD-10-CM | POA: Insufficient documentation

## 2023-06-03 LAB — URINALYSIS, MACROSCOPIC
BILIRUBIN: NEGATIVE mg/dL
BLOOD: NEGATIVE mg/dL
GLUCOSE: NEGATIVE mg/dL
KETONES: NEGATIVE mg/dL
NITRITE: NEGATIVE
PH: 7 (ref 5.0–9.0)
SPECIFIC GRAVITY: 1.022 (ref 1.001–1.030)
UROBILINOGEN: 1 mg/dL (ref 0.2–1.0)

## 2023-06-03 LAB — URINALYSIS, MICROSCOPIC

## 2023-06-06 ENCOUNTER — Ambulatory Visit (INDEPENDENT_AMBULATORY_CARE_PROVIDER_SITE_OTHER): Payer: Self-pay | Admitting: OBSTETRICS/GYNECOLOGY

## 2023-06-06 LAB — URINE CULTURE,ROUTINE: URINE CULTURE: 70000 — AB

## 2023-06-06 MED ORDER — CEPHALEXIN 500 MG CAPSULE
500.0000 mg | ORAL_CAPSULE | Freq: Two times a day (BID) | ORAL | 0 refills | Status: AC
Start: 2023-06-06 — End: 2023-06-13

## 2023-06-07 ENCOUNTER — Ambulatory Visit (INDEPENDENT_AMBULATORY_CARE_PROVIDER_SITE_OTHER): Payer: Self-pay | Admitting: OBSTETRICS/GYNECOLOGY

## 2023-06-07 ENCOUNTER — Ambulatory Visit (INDEPENDENT_AMBULATORY_CARE_PROVIDER_SITE_OTHER): Payer: Self-pay

## 2023-06-07 DIAGNOSIS — R399 Unspecified symptoms and signs involving the genitourinary system: Secondary | ICD-10-CM

## 2023-06-07 NOTE — Telephone Encounter (Signed)
 Left VM to reschedule CMG. Will offer next available 5/27. If agreeable pt will need UA complete 5/23. Orders placed. Will also send MyChart message.  Redmond Candle, RN, 06/07/2023 15:21

## 2023-06-07 NOTE — Telephone Encounter (Signed)
-----   Message from Darren Em, [place] sent at 06/06/2023  2:43 PM EDT -----  Regarding: FW: Clinical Question  ----- Message from Racheal E sent at 06/06/2023  1:43 PM EDT -----  Copied From CRM #[id number].  Kendal Pauling (Self) called with a clinical question.     Pt last saw Hobson Luna, MD    Hello,  Pt stated want to reschedule appointment on 06/07/2023.  Pt stated can call [phone] and to leave a message  due to phone system will not allow a call unless a message has been left.    Thank you  Racheal

## 2023-06-09 ENCOUNTER — Ambulatory Visit (INDEPENDENT_AMBULATORY_CARE_PROVIDER_SITE_OTHER): Payer: Self-pay | Admitting: OBSTETRICS/GYNECOLOGY

## 2023-06-09 NOTE — Telephone Encounter (Signed)
-----   Message from Karna Pacas, RN sent at 06/09/2023 10:53 AM EDT -----  Regarding: FW: Clinical Question  ----- Message from Mariah Shines sent at 06/09/2023 10:49 AM EDT -----  Copied From CRM #[id number].  Vanessa [name] () called with a clinical question.     GLAD PT    Pt needs to reschedule appt she had 4.29.25 for CMG, please contact pt    Thank you  Thersia Flax

## 2023-06-09 NOTE — Telephone Encounter (Signed)
 Pt agreeable to scheduling 5/27. She will provide urine sample 5/22.     Redmond Candle, RN, 06/09/2023 11:20

## 2023-06-30 ENCOUNTER — Other Ambulatory Visit: Payer: Self-pay

## 2023-06-30 ENCOUNTER — Ambulatory Visit: Attending: OBSTETRICS/GYNECOLOGY

## 2023-06-30 DIAGNOSIS — R399 Unspecified symptoms and signs involving the genitourinary system: Secondary | ICD-10-CM | POA: Insufficient documentation

## 2023-06-30 LAB — URINALYSIS, MACROSCOPIC
BILIRUBIN: NEGATIVE mg/dL
BLOOD: NEGATIVE mg/dL
GLUCOSE: NEGATIVE mg/dL
KETONES: NEGATIVE mg/dL
NITRITE: NEGATIVE
PH: 6.5 (ref 5.0–9.0)
PROTEIN: NEGATIVE mg/dL
SPECIFIC GRAVITY: 1.02 (ref 1.001–1.030)
UROBILINOGEN: 0.2 mg/dL (ref 0.2–1.0)

## 2023-06-30 LAB — URINALYSIS, MICROSCOPIC: BACTERIA: NEGATIVE /HPF

## 2023-07-01 ENCOUNTER — Ambulatory Visit (HOSPITAL_COMMUNITY): Payer: Self-pay | Admitting: OBSTETRICS/GYNECOLOGY

## 2023-07-02 LAB — URINE CULTURE,ROUTINE: URINE CULTURE: 20000 — AB

## 2023-07-05 ENCOUNTER — Encounter (INDEPENDENT_AMBULATORY_CARE_PROVIDER_SITE_OTHER): Payer: Self-pay

## 2023-07-05 ENCOUNTER — Other Ambulatory Visit: Payer: Self-pay

## 2023-07-05 ENCOUNTER — Ambulatory Visit (INDEPENDENT_AMBULATORY_CARE_PROVIDER_SITE_OTHER): Payer: Self-pay

## 2023-07-05 VITALS — Wt 149.3 lb

## 2023-07-05 DIAGNOSIS — R399 Unspecified symptoms and signs involving the genitourinary system: Secondary | ICD-10-CM

## 2023-07-05 LAB — POC URINALYSIS (RESULTS)
BILIRUBIN: NEGATIVE mg/dL
GLUCOSE: NEGATIVE mg/dL
KETONES: NEGATIVE mg/dL
LEUKOCYTES: NEGATIVE WBCs/uL
NITRITE: NEGATIVE
PH: 6 (ref 5.0–8.0)
PROTEIN: NEGATIVE mg/dL
SPECIFIC GRAVITY: 1.015 (ref 1.005–1.030)
UROBILINOGEN: 0.2 mg/dL

## 2023-07-05 NOTE — Patient Instructions (Signed)
Patient was informed of the following: you may experience some urinary frequency, urgency, blood in the urine, burning or difficulty urinating for the rest of the day.  Increase fluid intake to flush out the bladder.  Avoid caffeinated, carbonated or alcoholic beverages because it may irritate the bladder lining.  Watch for signs of infection such as fever, chills low back pain or persistent blood in the urine. If you notice any of those symptoms report them to the clinic or go to the Emergency Room for evaluation.

## 2023-07-05 NOTE — Procedures (Signed)
 OB/GYN, [facility]  [address]  [place] [address]    Procedure Note    Name: Vanessa [name] MRN:  [id number]   Date: 07/05/2023 DOB:  [birth year] (67 y.o.)         Urodynamics    Performed by: Hobson Luna, MD  Authorized by: Hobson Luna, MD    Procedure discussed: discussed risks, benefits and alternatives    Chaperone present: yes    Timeout: timeout called immediately prior to procedure    Prep: patient was prepped and draped in usual sterile fashion    Position: dorsal lithotomy    Procedure Details     Procedure: CMG with voiding pressure, EMG and uroflow      CMG with Voiding Pressure Details:     First sensation (mL): 270    First urge to void (mL): 305    Urgency to void (mL): 378    Bladder capacity (mL): 446    Detrusor pressure (cmH2O): 6    EMG Details:     Results: normal      Uroflow Details:     Pre-void volume (mL): 446    Voiding duration (sec): 90    Average flow rate (mL/sec): 1.6    Max flow rate (mL/sec): 3    Type of curve: flattened      Voided urine (mL): 393.8    Residual urine (mL): 52.2    Normal void for patient: no      Post-Procedure Details     Outcome: patient tolerated procedure well with no complications      Additional Details      Due to multiple allergies, no prophylactic antibiotic given.    Hobson Luna, MD

## 2023-07-05 NOTE — Addendum Note (Signed)
 Addended by: Redmond Candle on: 07/05/2023 03:21 PM     Modules accepted: Orders

## 2023-07-05 NOTE — Nursing Note (Signed)
 The patient voided prior to procedure, UA neg. For the urodynamic testing, the patient was first placed in the dorsolithotomy position. The patient's genitalia was prepped with Betadine using sterile technique. Per protocol, a urodynamic urethral catheter was placed to fill the bladder and record intravesical pressure, the bladder was drained to assess post-void residual(PVR) bladder volume, and a rectal catheter was placed to record abdominal pressure, and electromyography (EMG) electrodes were placed for neuromuscular testing.   After placement of catheters and confirming the bladder was empty, the patient was placed in a sitting position. The transducers were zeroed and equalized according to guidelines. Commenced bladder filling using room-temperature NaCl. The bladder was filled at a 50 ml/min rate. Throughout entire procedure while filling bladder, questions were asked of patient so that markers could be noted for first sensation, first desire, strong desire, and finally patient's sensation of bladder fullness verbalizing capacity and desire to void. Monitored bladder compliance, the change in volume per change in pressure, and marked the presence of single events that may have occurred.   When the bladder was filled to 250 mL, the abdominal leak-point pressure (ALPP) was measured to investigate for stress urinary incontinence. The input of NaCl was paused, the patient was instructed to cough (ie, mild, moderate, strong). Observed for urine leakage.  Patient was noted to leak during stress test. Upon completion of ALPP testing, the filling of the bladder resumed.  When the bladder is filled to capacity and the patient has a strong desire to void and sensation of capacity, the patient was instructed to void. Patient was unable to completely empty bladder. Urinary catheter was removed and patient was still unable to void. When the patient finished voiding, catheters were removed and CMG/Urodynamics completed.  Patient was able to void in a had in the restroom for 350 mL.      PATIENT's RESULTS:   PVR prior to starting CMG procedure-- 6 mL  ?   First Sensation-  270 mL  First Desire- 305 mL  Strong Desire- 378 mL  Input -- 446 mL  Output--43.8 mL during procedure. 350 mL in hat in restroom  PVR--52.2 mL    Redmond Candle, RN, 07/05/2023 11:51

## 2023-07-07 ENCOUNTER — Telehealth (INDEPENDENT_AMBULATORY_CARE_PROVIDER_SITE_OTHER): Payer: Self-pay | Admitting: OBSTETRICS/GYNECOLOGY

## 2023-07-07 NOTE — Telephone Encounter (Signed)
-----   Message from Hobson Luna, MD sent at 07/05/2023 12:35 PM EDT -----  4 to 6 weeks is fine, she may have a video visit.  Thank you,  Hewitt Lou  ----- Message -----  From: Marcene Serve, RN  Sent: 07/05/2023  11:27 AM EDT  To: Hobson Luna, MD    How soon after her CMG today do you need to do a f/u ?  ----- Message -----  From: Redmond Candle, RN  Sent: 07/05/2023  11:11 AM EDT  To: Ob/Gyn-Sp-[facility] Clinical Support    Patient had CMG completed today in [place]. States she would prefer f/u for results with Dr. Illene Malm to be scheduled in [place]. Please reach out to schedule.    -Alisa App

## 2023-07-07 NOTE — Nursing Note (Signed)
 Video visit scheduled 08-10-23 at 3:15pm of which the pt is agreeable.  Reviewed how to get on her appt in My Chart

## 2023-08-10 ENCOUNTER — Encounter (INDEPENDENT_AMBULATORY_CARE_PROVIDER_SITE_OTHER): Payer: Self-pay | Admitting: OBSTETRICS/GYNECOLOGY

## 2023-08-10 ENCOUNTER — Telehealth: Payer: Self-pay | Admitting: OBSTETRICS/GYNECOLOGY

## 2023-08-10 ENCOUNTER — Other Ambulatory Visit: Payer: Self-pay

## 2023-08-10 DIAGNOSIS — N393 Stress incontinence (female) (male): Secondary | ICD-10-CM

## 2023-08-10 DIAGNOSIS — Z01818 Encounter for other preprocedural examination: Secondary | ICD-10-CM

## 2023-08-10 DIAGNOSIS — N816 Rectocele: Secondary | ICD-10-CM

## 2023-08-10 NOTE — Progress Notes (Signed)
 Progress Note    TELEMEDICINE DOCUMENTATION:    Patient Location:  MyChart video visit from home address: [address]  [place] [address]    Patient/family aware of provider location:  yes  Patient/family consent for telemedicine:  yes  Examination observed and performed by:  Jerilynn Finder, MD        Chief Complaint:  This is a 68 y.o. White female who is a G2P1011 that presents for continued concerns with the pelvic pressure and urinary leaking    HPI:  68 year old Caucasian female, gravida 2 para 1011 who was found with recent complex urodynamic testing to have stress urinary incontinence with some delayed bladder emptying but no evidence of urinary retention.  She also has a grade 2 rectocele and perineal laxity.    She is interested in repair but realizes that with her normal but delayed bladder emptying that she may be at high-risk to have a Foley catheter placed and the need for medication and/need for self catheterization if she has urinary retention postoperatively.  Again, she had delayed but normal bladder emptying with a PVR of less than 60 mL.    Last Mammogram:    Recent Results (from the past 82479 hours)   MAMMO BILATERAL SCREENING-ADDL VIEWS/BREAST US  AS REQ BY RAD    Collection Time: 02/25/23 10:14 AM    Narrative    Computer aided detection (CAD) technology has been applied to the standard   (CC and MLO) images.    3D tomosynthesis and 2D images were acquired and reviewed.    FILMS COMPARED:  Compared to: 11/04/2021 MAMMO BILATERAL SCREENING-ADDL VIEWS/BREAST US  AS   REQ BY RAD, 09/18/2020 MAMMO BILATERAL SCREENING-ADDL VIEWS/BREAST US  AS   REQ BY RAD, 09/18/2020 EXTERNAL MAMMOGRAM COMPARISON IMAGES, and   07/16/2019 MAMMO BILATERAL SCREENING-ADDL VIEWS/BREAST US  AS REQ BY RAD    HISTORY:  Procedure: MAMMO BILATERAL SCREENING W TOMO-ADDL VIEWS/BREAST US  AS REQ BY   RAD  Reason for exam: Encounter for screening mammogram for malignant neoplasm   of breast      FINDINGS:  The breasts  are heterogeneously dense, which may obscure small masses.    Bilateral  There is no evidence of suspicious masses, calcifications, or other   abnormal findings.      Impression    Follow up mammogram in 1 year is recommended for both breasts.    BI-RADS ATLAS category (overall): 1 - Negative      [facility]   [place], [place] [address]  [phone]    Radiologist location ID: [id number]     Last DXA:    Recent Results (from the past [id number] hours)   DEXA BONE DENSITOMETRY    Collection Time: 03/30/22 12:27 PM    Narrative    INDICATION:  Osteoporosis screening.  Postmenopausal.  Additional History:  Height loss of 1.5 inches.  HRT.  Smoker.  Levothyroxine.  Calcium.  Hysterectomy.    TECHNIQUE:  A dual energy x-ray absorptiometry (DXA) of the lumbar spine and left hip.    [employer]; Version [id number].    COMPARISON:  None.    FINDINGS:  Bone mineral density of the lumbar spine is 1.033 g/cm2 with a T-score of -0.1 and a Z-score of 1.6.      Bone mineral density of the left femoral neck is 0.646 g/cm2 with a T-score of -1.8 and a Z-score of -0.3.  Bone mineral density of the left hip is 0.781 g/cm2 with a  T-score of -1.3 and a Z-score of -0.1.        Impression    Based on the lowest T-score of -1.8 in the left femoral neck, fracture risk is increased; WHO classification low bone density.    FRAX Score  10-year fracture risk for major osteoporotic fracture:  10% based on the values obtained in the left hip.  10-year fracture risk for hip fracture:  2.3%    RECOMMENDATION:  Follow-up in 2 years, or as clinically warranted.          Signed by Alm Glendia Olds, DO          Past Medical History:  All were reviewed and updated in the EMR.  Past Surgical History:   Procedure Laterality Date    ABDOMINAL SACROCOLPOPEXY N/A 04/22/2021    at [facility]    HX HYSTERECTOMY  04/22/2021    HX TLHBSO Bilateral 04/22/2021    TLH/BSO    VARICOSE VEIN SURGERY             Past Medical History:   Diagnosis Date    Allergic rhinitis     Depression     Disorder of thyroid      Fibromyalgia     Osteoarthritis     TMJ (dislocation of temporomandibular joint)     Tremors of nervous system     Vaginal infection     recurrent bv    Varicosities of leg          Current Outpatient Medications   Medication Sig    acetaminophen-codeine (TYLENOL #3) 300-30 mg Oral Tablet Take 1 Tablet by mouth    amLODIPine (NORVASC) 5 mg Oral Tablet Take 1 Tablet (5 mg total) by mouth Daily    cholecalciferol, vitamin D3, 25 mcg (1,000 unit) Oral Tablet Take 1 Tablet (1,000 Units total) by mouth    cyclobenzaprine (FLEXERIL) 10 mg Oral Tablet TAKE 1 TABLET BY MOUTH THREE TIMES DAILY FOR SPASMS (Patient not taking: Reported on 04/28/2023)    DULoxetine (CYMBALTA DR) 60 mg Oral Capsule, Delayed Release(E.C.) Take 1 Capsule (60 mg total) by mouth Daily    estradioL (ESTRACE) 0.01 % (0.1 mg/gram) Vaginal Cream Insert 0.5 g into the vagina (Patient not taking: Reported on 04/28/2023)    etodolac (LODINE) 300 mg Oral Capsule Take 1 Capsule (300 mg total) by mouth Twice daily (Patient not taking: Reported on 04/28/2023)    famotidine (PEPCID) 10 mg Oral Tablet Take 2 Tablets (20 mg total) by mouth    levothyroxine (SYNTHROID) 50 mcg Oral Tablet TAKE 1 TABLET BY MOUTH EVERY MORNING 30 MINUTES BEFORE BREAKFAST (Patient not taking: Reported on 04/28/2023)    levothyroxine (SYNTHROID) 75 mcg Oral Tablet PLEASE SEE ATTACHED FOR DETAILED DIRECTIONS    magnesium oxide (MAG-OX) 400 mg Oral Tablet Take 1 Tablet (400 mg total) by mouth    metroNIDAZOLE  (METROGEL -VAGINAL) 0.75 % (37.5mg /5 gram) Vaginal Gel Insert 1 Applicator into the vagina Every night Use one applicator intravaginally nightly for 5 nights, then 1 applicator weekly for 6 months.    multivitamin Oral Tablet Take 1 Tablet by mouth    nystatin (MYCOSTATIN) 100,000 unit/mL Oral Suspension     pregabalin (LYRICA) 100 mg Oral Capsule TAKE 1 CAPSULE BY MOUTH EVERY MORNING  AND 1 CAPSULE EVERY NIGHT    pregabalin (LYRICA) 50 mg Oral Capsule Take 3 Capsules (150 mg total) by mouth Every night    propranoloL  (INDERAL ) 40 mg Oral Tablet Take 1 Tablet (40 mg total) by  mouth Twice daily    tamsulosin  (FLOMAX ) 0.4 mg Oral Capsule Take 1 Capsule (0.4 mg total) by mouth Once a day    triamcinolone acetonide 0.1 % Ointment APPLY TWICE DAILY TO AFFECTED AREA FOR TWO WEEKS, TAKE A TWO WEEK BREAK AND REPEAT AS NEEDED.    zolpidem (AMBIEN) 10 mg Oral Tablet Take 1 Tablet (10 mg total) by mouth Daily     Allergies[1]    Ob/Gyn Hx:    OB History       Gravida   2    Para   1    Term   1    Preterm        AB   1    Living   1         SAB   1    IAB        Ectopic        Multiple        Live Births                   Social History     Substance and Sexual Activity   Sexual Activity Not Currently    Birth control/protection: Surgical       Social History:  Social History[2]      Family History:  Family Medical History:       Problem Relation (Age of Onset)    Breast Cancer Maternal Grandmother, Maternal Aunt    No Known Problems Father, Sister, Brother, Maternal Grandfather, Paternal Grandmother, Paternal Grandfather, Daughter, Son, Maternal Uncle, Paternal Aunt, Paternal Uncle    Osteoporosis Mother    Uterine Fibroids Other              ROS:   Constitutional:  No unexplained weight gain or loss, no loss of appetite, no fever, no night sweats or chills, no pain in jaws when eating, no scalp tenderness,  Ears nose mouth and throat:  No difficulty with hearing, no sinus problems, no runny nose, no postnasal drip, no ringing ears, no mouth sores, loose teeth, ear pain nose bleeds, sore throat facial pain or numbness.  Cardiovascular no irregular heart beats, racing heartbeat, chest pains, swelling of feet or legs, no pain in legs with walking.  Respiratory no shortness of breath, no night sweats, no prolonged cough, wheezing, sputum production, no hemoptysis.  Gastrointestinal no heartburn, constipation,  intolerance to foods, diarrhea abdominal pain, difficulty swallowing, nausea, vomiting, and no change in bowel movements.  Integument:  No rash, itching, skin lesions, change in hair.  Neurologic no frequent headaches, double vision, weakness, changes in station, problems with walking or balance, dizziness, tremor, loss of consciousness no visual loss.  Psychiatric:  No insomnia, irritability, depression, anxiety, mood swings, hallucinations.  Endocrine no intolerance to heat and cold, no frequent hunger, urination, thirst.  No changes sex drive.  Hematologic no easy bleeding, easy bruising, anemia, abnormal swollen areas.  Allergy/immunology:  No seasonal allergies, hay fever, itching, frequent infections, no exposure to HIV.    Physical Examination:  There were no vitals filed for this visit.      There is no height or weight on file to calculate BMI.    Physical examination by inspection today  General: no acute distress  Constitutional:  Well developed, well nourished, alert and oriented x4  Skin: intact, without lesions  Neurologic: alert and oriented, no focal deficits noted  Psychiatric: mood stable, normal affect, alert and oriented x4    Gynecologic:  Deferred      Examination for a 68 y.o. female who presents with rectocele and perineal laxity that desires a posterior colporrhaphy and perineorrhaphy.  With her history of stress urinary incontinence she was noted to have no concern for overflow and no evidence of urinary retention however with the somewhat delayed/slowed bladder emptying with no evidence of incomplete bladder emptying (normal postvoid residual we did offer the patient the opportunity for mid urethral sling with the understanding that she might require the need for either Foley catheter after the surgery or consideration for self catheterization and use of medication to help with bladder emptying postoperatively.  She agrees to follow up with surgery and will schedule at the [facility].  Consequences risks and benefits of the surgery were reviewed in detail and further information to review on the procedure with distributed to the patient.      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 Primary stress urinary incontinence  N39.3       3. Preoperative testing  Z01.818 BASIC METABOLIC PANEL     CBC/DIFF     ECG 12 LEAD     XR CHEST PA AND LATERAL           Orders Placed This Encounter    XR CHEST PA AND LATERAL    BASIC METABOLIC PANEL    CBC/DIFF    ECG 12 LEAD     Follow up for her scheduled surgery    Jerilynn Finder, MD        [1]   Allergies  Allergen Reactions    Nabumetone Rash     itching   itching   itching   itching    itching   itching    itching    Tramadol  Other Adverse Reaction (Add comment)     headaches   headaches   headaches   headaches    headaches   headaches    headaches    Trazodone     Erythromycin Diarrhea and  Other Adverse Reaction (Add comment)     cramps   Other reaction(s): Muscle Pain, Other (See Comments)   cramps   cramps    Other reaction(s): Muscle Pain, Other (See Comments)   cramps   cramps    cramps    Gabapentin  Other Adverse Reaction (Add comment) and Nausea/ Vomiting     Increased tremors   tremors   Other reaction(s): Other (See Comments)   Increased tremors   tremors   tremors    Other reaction(s): Other (See Comments)   Increased tremors   tremors   tremors    tremors    Levofloxacin Nausea/ Vomiting     Other reaction(s): Other (See Comments)   GI pain.   GI pain.    Other reaction(s): Other (See Comments)   GI pain.    GI pain.    Nitrofurantoin Diarrhea and Nausea/ Vomiting    Oseltamivir Diarrhea and Nausea/ Vomiting     Cramps and stomach issues   Other reaction(s): Other (See Comments)   Cramps and stomach issues    Other reaction(s): Other (See Comments)   Cramps and stomach issues    Cramps and stomach issues    Sulfamethoxazole-Trimethoprim Nausea/ Vomiting   [2]   Social History  Tobacco Use    Smoking status: Every  Day     Current packs/day: 1.00     Average packs/day: 1  pack/day for 49.5 years (49.5 ttl pk-yrs)     Types: Cigarettes     Start date: 1976    Smokeless tobacco: Never   Vaping Use    Vaping status: Never Used   Substance Use Topics    Alcohol use: Not Currently    Drug use: Never

## 2023-10-07 ENCOUNTER — Ambulatory Visit (HOSPITAL_BASED_OUTPATIENT_CLINIC_OR_DEPARTMENT_OTHER)
Admission: RE | Admit: 2023-10-07 | Discharge: 2023-10-07 | Disposition: A | Discharge status: Home / Self Care | Source: Ambulatory Visit | Attending: OBSTETRICS/GYNECOLOGY | Admitting: OBSTETRICS/GYNECOLOGY

## 2023-10-07 ENCOUNTER — Ambulatory Visit
Admission: RE | Admit: 2023-10-07 | Discharge: 2023-10-07 | Disposition: A | Discharge status: Home / Self Care | Source: Ambulatory Visit | Attending: OBSTETRICS/GYNECOLOGY | Admitting: OBSTETRICS/GYNECOLOGY

## 2023-10-07 ENCOUNTER — Ambulatory Visit (HOSPITAL_BASED_OUTPATIENT_CLINIC_OR_DEPARTMENT_OTHER)

## 2023-10-07 ENCOUNTER — Other Ambulatory Visit: Payer: Self-pay

## 2023-10-07 DIAGNOSIS — Z01818 Encounter for other preprocedural examination: Secondary | ICD-10-CM | POA: Insufficient documentation

## 2023-10-07 LAB — CBC WITH DIFF
BASOPHIL #: <0.1 x10ˆ3/uL (ref <=0.20)
BASOPHIL %: 1.2 %
EOSINOPHIL #: 0.69 x10ˆ3/uL — ABNORMAL HIGH (ref <=0.50)
EOSINOPHIL %: 9.5 %
HCT: 35.9 % (ref 34.8–46.0)
HGB: 12.4 g/dL (ref 11.5–16.0)
IMMATURE GRANULOCYTE #: <0.1 x10ˆ3/uL (ref <0.10)
IMMATURE GRANULOCYTE %: 0.1 % (ref 0.0–1.0)
LYMPHOCYTE #: 2.22 x10ˆ3/uL (ref 1.00–4.80)
LYMPHOCYTE %: 30.5 %
MCH: 32.3 pg — ABNORMAL HIGH (ref 26.0–32.0)
MCHC: 34.5 g/dL (ref 31.0–35.5)
MCV: 93.5 fL (ref 78.0–100.0)
MONOCYTE #: 0.45 x10ˆ3/uL (ref 0.20–1.10)
MONOCYTE %: 6.2 %
MPV: 10.6 fL (ref 8.7–12.5)
NEUTROPHIL #: 3.81 x10ˆ3/uL (ref 1.50–7.70)
NEUTROPHIL %: 52.5 %
PLATELETS: 215 x10ˆ3/uL (ref 150–400)
RBC: 3.84 x10ˆ6/uL — ABNORMAL LOW (ref 3.85–5.22)
RDW-CV: 12.5 % (ref 11.5–15.5)
WBC: 7.3 x10ˆ3/uL (ref 3.7–11.0)

## 2023-10-07 LAB — BASIC METABOLIC PANEL
ANION GAP: 6 mmol/L (ref 4–13)
BUN/CREA RATIO: 17 (ref 6–22)
BUN: 17 mg/dL (ref 8–25)
CALCIUM: 9.2 mg/dL (ref 8.6–10.3)
CHLORIDE: 102 mmol/L (ref 96–111)
CO2 TOTAL: 28 mmol/L (ref 23–31)
CREATININE: 1.03 mg/dL (ref 0.60–1.05)
ESTIMATED GFR - FEMALE: 60 mL/min/BSA (ref >=60)
GLUCOSE: 94 mg/dL (ref 65–125)
POTASSIUM: 4.5 mmol/L (ref 3.5–5.1)
SODIUM: 136 mmol/L (ref 136–145)

## 2023-10-09 DIAGNOSIS — Z01818 Encounter for other preprocedural examination: Secondary | ICD-10-CM

## 2023-10-09 LAB — ECG 12 LEAD
Atrial Rate: 66 {beats}/min
Calculated P Axis: -11 degrees
Calculated R Axis: -12 degrees
Calculated T Axis: 12 degrees
PR Interval: 184 ms
QRS Duration: 86 ms
QT Interval: 430 ms
QTC Calculation: 450 ms
Ventricular rate: 66 {beats}/min

## 2023-10-11 ENCOUNTER — Ambulatory Visit (INDEPENDENT_AMBULATORY_CARE_PROVIDER_SITE_OTHER): Payer: Self-pay | Admitting: OBSTETRICS/GYNECOLOGY

## 2023-10-11 DIAGNOSIS — M47819 Spondylosis without myelopathy or radiculopathy, site unspecified: Secondary | ICD-10-CM

## 2023-10-11 DIAGNOSIS — M4135 Thoracogenic scoliosis, thoracolumbar region: Secondary | ICD-10-CM

## 2023-10-11 DIAGNOSIS — Z01818 Encounter for other preprocedural examination: Secondary | ICD-10-CM

## 2023-10-11 NOTE — Telephone Encounter (Signed)
-----   Message from Jerilynn Finder, MD sent at 10/11/2023 10:33 AM EDT -----  Regarding: RE: Clinical Question  ----- Message from Vina HERO sent at 10/11/2023  9:36 AM EDT -----  Copied From CRM #[id number].  Dorrie Orie CROME (Self) called with a clinical question.       Glad Pt  Pt is asking what is the downtime for her surgery on Friday.     Thank You  Paula McMillen

## 2023-10-11 NOTE — Nursing Note (Signed)
 Vanessa Satterfield, MD sent to Vanessa Mar, RN  4-6 weeks      Pt notified of time off needed for her upcoming surgery   [name] Steffan, RN  10/11/2023 10:57

## 2023-10-12 ENCOUNTER — Encounter (HOSPITAL_COMMUNITY): Payer: Self-pay | Admitting: OBSTETRICS/GYNECOLOGY

## 2023-10-12 NOTE — OR PreOp (Signed)
 Vanessa [name] aware patient did not stop vitamins herbals or motrin. Patient taking motrin 400mg  every am and took this am. Dr Peterson aware and we may proceed with surgery but patient to stop vitamins herbals motrin now. Patient notified and aware.

## 2023-10-14 ENCOUNTER — Ambulatory Visit
Admission: RE | Admit: 2023-10-14 | Discharge: 2023-10-14 | Disposition: A | Discharge status: Home / Self Care | Payer: Self-pay | Source: Ambulatory Visit | Attending: OBSTETRICS/GYNECOLOGY | Admitting: OBSTETRICS/GYNECOLOGY

## 2023-10-14 ENCOUNTER — Encounter (HOSPITAL_COMMUNITY): Payer: Self-pay | Admitting: OBSTETRICS/GYNECOLOGY

## 2023-10-14 ENCOUNTER — Ambulatory Visit (HOSPITAL_COMMUNITY)

## 2023-10-14 ENCOUNTER — Encounter (HOSPITAL_COMMUNITY)
Admission: RE | Disposition: A | Discharge status: Home / Self Care | Payer: Self-pay | Source: Ambulatory Visit | Attending: OBSTETRICS/GYNECOLOGY

## 2023-10-14 ENCOUNTER — Other Ambulatory Visit: Payer: Self-pay

## 2023-10-14 DIAGNOSIS — N816 Rectocele: Secondary | ICD-10-CM

## 2023-10-14 DIAGNOSIS — N393 Stress incontinence (female) (male): Secondary | ICD-10-CM

## 2023-10-14 DIAGNOSIS — Z90710 Acquired absence of both cervix and uterus: Secondary | ICD-10-CM | POA: Insufficient documentation

## 2023-10-14 DIAGNOSIS — N819 Female genital prolapse, unspecified: Secondary | ICD-10-CM | POA: Diagnosis present

## 2023-10-14 HISTORY — DX: Diverticulitis of large intestine without perforation or abscess without bleeding: K57.32

## 2023-10-14 HISTORY — DX: Polyneuropathy, unspecified: G62.9

## 2023-10-14 HISTORY — DX: Dorsopathy, unspecified: M53.9

## 2023-10-14 HISTORY — DX: Other general symptoms and signs: R68.89

## 2023-10-14 HISTORY — DX: Irritable bowel syndrome, unspecified: K58.9

## 2023-10-14 HISTORY — DX: Essential (primary) hypertension: I10

## 2023-10-14 HISTORY — DX: Other forms of dyspnea: R06.09

## 2023-10-14 HISTORY — DX: Emphysema, unspecified: J43.9

## 2023-10-14 HISTORY — DX: Hypothyroidism, unspecified: E03.9

## 2023-10-14 SURGERY — VAGINAL TRANSOBTURATOR SLING
Anesthesia: General | Wound class: Clean Contaminated Wounds-The respiratory, GI, Genital, or urinary

## 2023-10-14 MED ORDER — HYDROCODONE 5 MG-ACETAMINOPHEN 325 MG TABLET
1.0000 | ORAL_TABLET | ORAL | 0 refills | Status: DC | PRN
Start: 2023-10-14 — End: 2023-11-14

## 2023-10-14 MED ORDER — DEXAMETHASONE SODIUM PHOSPHATE 4 MG/ML INJECTION SOLUTION
Freq: Once | INTRAMUSCULAR | Status: DC | PRN
Start: 2023-10-14 — End: 2023-10-14
  Administered 2023-10-14: 4 mg via INTRAVENOUS

## 2023-10-14 MED ORDER — FENTANYL (PF) 50 MCG/ML INJECTION SOLUTION
Freq: Once | INTRAMUSCULAR | Status: DC | PRN
Start: 2023-10-14 — End: 2023-10-14
  Administered 2023-10-14: 100 ug via INTRAVENOUS

## 2023-10-14 MED ORDER — LIDOCAINE-EPINEPHRINE 0.5 %-1:200,000 INJECTION SOLUTION
Freq: Once | INTRAMUSCULAR | Status: DC | PRN
Start: 2023-10-14 — End: 2023-10-14
  Administered 2023-10-14: 18 mL via INTRAMUSCULAR

## 2023-10-14 MED ORDER — HALOPERIDOL LACTATE 5 MG/ML INJECTION SOLUTION
0.5000 mg | Freq: Once | INTRAMUSCULAR | Status: DC | PRN
Start: 2023-10-14 — End: 2023-10-14

## 2023-10-14 MED ORDER — LIDOCAINE HCL 20 MG/ML (2 %) INJECTION SOLUTION
Freq: Once | INTRAMUSCULAR | Status: DC | PRN
Start: 2023-10-14 — End: 2023-10-14
  Administered 2023-10-14: 60 mg via INTRAVENOUS

## 2023-10-14 MED ORDER — PROPOFOL 10 MG/ML IV BOLUS
INJECTION | Freq: Once | INTRAVENOUS | Status: DC | PRN
Start: 2023-10-14 — End: 2023-10-14
  Administered 2023-10-14: 100 mg via INTRAVENOUS

## 2023-10-14 MED ORDER — SCOPOLAMINE 1 MG OVER 3 DAYS TRANSDERMAL PATCH
1.0000 | MEDICATED_PATCH | Freq: Once | TRANSDERMAL | Status: DC
Start: 2023-10-14 — End: 2023-10-14

## 2023-10-14 MED ORDER — ONDANSETRON 8 MG DISINTEGRATING TABLET
8.0000 mg | ORAL_TABLET | Freq: Three times a day (TID) | ORAL | 0 refills | Status: DC | PRN
Start: 2023-10-14 — End: 2023-11-14

## 2023-10-14 MED ORDER — GLYCOPYRROLATE 0.2 MG/ML INJECTION SOLUTION
Freq: Once | INTRAMUSCULAR | Status: DC | PRN
Start: 2023-10-14 — End: 2023-10-14
  Administered 2023-10-14: .1 mg via INTRAVENOUS

## 2023-10-14 MED ORDER — MIDAZOLAM 1 MG/ML INJECTION WRAPPER
Freq: Once | INTRAMUSCULAR | Status: DC | PRN
Start: 2023-10-14 — End: 2023-10-14
  Administered 2023-10-14: 2 mg via INTRAVENOUS

## 2023-10-14 MED ORDER — IBUPROFEN 600 MG TABLET
600.0000 mg | ORAL_TABLET | Freq: Four times a day (QID) | ORAL | 0 refills | Status: DC | PRN
Start: 2023-10-14 — End: 2023-11-14

## 2023-10-14 MED ORDER — DOCUSATE SODIUM 100 MG CAPSULE
100.0000 mg | ORAL_CAPSULE | Freq: Two times a day (BID) | ORAL | 0 refills | Status: AC | PRN
Start: 2023-10-14 — End: ?

## 2023-10-14 MED ORDER — FENTANYL (PF) 50 MCG/ML INJECTION SOLUTION
50.0000 ug | INTRAMUSCULAR | Status: DC | PRN
Start: 2023-10-14 — End: 2023-10-14
  Administered 2023-10-14 (×2): 50 ug via INTRAVENOUS
  Filled 2023-10-14 (×2): qty 1

## 2023-10-14 MED ORDER — LIDOCAINE-EPINEPHRINE 0.5 %-1:200,000 INJECTION SOLUTION
INTRAMUSCULAR | Status: AC
Start: 2023-10-14 — End: 2023-10-14
  Filled 2023-10-14: qty 50

## 2023-10-14 MED ORDER — SODIUM CHLORIDE 0.9 % (FLUSH) INJECTION SYRINGE
3.0000 mL | INJECTION | Freq: Three times a day (TID) | INTRAMUSCULAR | Status: DC
Start: 2023-10-14 — End: 2023-10-14

## 2023-10-14 MED ORDER — APREPITANT 40 MG CAPSULE
40.0000 mg | ORAL_CAPSULE | Freq: Once | ORAL | Status: AC
Start: 2023-10-14 — End: 2023-10-14
  Administered 2023-10-14: 40 mg via ORAL
  Filled 2023-10-14: qty 1

## 2023-10-14 MED ORDER — LACTATED RINGERS INTRAVENOUS SOLUTION
INTRAVENOUS | Status: DC
Start: 2023-10-14 — End: 2023-10-14
  Administered 2023-10-14: 0 via INTRAVENOUS

## 2023-10-14 MED ORDER — SODIUM CHLORIDE 0.9 % INTRAVENOUS SOLUTION
2.0000 g | Freq: Once | INTRAVENOUS | Status: DC
Start: 2023-10-14 — End: 2023-10-14
  Filled 2023-10-14: qty 14.71

## 2023-10-14 MED ORDER — SODIUM CHLORIDE 0.9 % IRRIGATION SOLUTION
Freq: Once | Status: DC | PRN
Start: 2023-10-14 — End: 2023-10-14
  Administered 2023-10-14: 1000 mL
  Administered 2023-10-14: 500 mL

## 2023-10-14 MED ORDER — DEXMEDETOMIDINE 4 MCG/ML IV DILUTION
Freq: Once | INTRAMUSCULAR | Status: DC | PRN
Start: 2023-10-14 — End: 2023-10-14
  Administered 2023-10-14: 12 ug via INTRAVENOUS

## 2023-10-14 MED ORDER — ROCURONIUM 10 MG/ML INTRAVENOUS SOLUTION
Freq: Once | INTRAVENOUS | Status: DC | PRN
Start: 2023-10-14 — End: 2023-10-14
  Administered 2023-10-14: 50 mg via INTRAVENOUS

## 2023-10-14 MED ORDER — SUGAMMADEX 100 MG/ML INTRAVENOUS SOLUTION
Freq: Once | INTRAVENOUS | Status: DC | PRN
Start: 2023-10-14 — End: 2023-10-14
  Administered 2023-10-14: 200 mg via INTRAVENOUS

## 2023-10-14 MED ORDER — ACETAMINOPHEN 1,000 MG/100 ML (10 MG/ML) INTRAVENOUS SOLUTION
1000.0000 mg | Freq: Once | INTRAVENOUS | Status: DC
Start: 2023-10-14 — End: 2023-10-14
  Administered 2023-10-14: 1000 mg via INTRAVENOUS
  Administered 2023-10-14: 0 mg via INTRAVENOUS
  Filled 2023-10-14: qty 100

## 2023-10-14 MED ORDER — FLUORESCEIN 500 MG/5 ML (10 %) INTRAVENOUS SOLUTION
INTRAVENOUS | Status: AC
Start: 2023-10-14 — End: 2023-10-14
  Filled 2023-10-14: qty 5

## 2023-10-14 MED ORDER — OXYCODONE 5 MG TABLET
5.0000 mg | ORAL_TABLET | Freq: Once | ORAL | Status: DC | PRN
Start: 2023-10-14 — End: 2023-10-14
  Administered 2023-10-14: 5 mg via ORAL
  Filled 2023-10-14: qty 1

## 2023-10-14 MED ORDER — SODIUM CHLORIDE 0.9 % (FLUSH) INJECTION SYRINGE
3.0000 mL | INJECTION | INTRAMUSCULAR | Status: DC | PRN
Start: 2023-10-14 — End: 2023-10-14

## 2023-10-14 MED ORDER — METRONIDAZOLE 500 MG/100 ML IN SODIUM CHLOR(ISO) INTRAVENOUS PIGGYBACK
500.0000 mg | INJECTION | Freq: Once | INTRAVENOUS | Status: DC
Start: 2023-10-14 — End: 2023-10-14
  Filled 2023-10-14: qty 100

## 2023-10-14 SURGICAL SUPPLY — 42 items
ADH SKNCLS CYNCRLT EXOFIN HVSC STRL TISS LF  DISP 1G (MED SURG SUPPLIES) ×2 IMPLANT
BAG DRAIN 2000ML ANTIREFLUX TWR SLIDE TAP PORT REINF HNGR STRL LF (UROLOGICAL SUPPLIES) IMPLANT
BLADE 11 SURG STRL (SURGICAL CUTTING SUPPLIES) ×2 IMPLANT
BLADE 15 BD CNV SS SURG TISS STRL LF  DISP (SURGICAL CUTTING SUPPLIES) IMPLANT
BLADE 15 PRSNA + UNFRM SHRP SS SURG STRL (SURGICAL CUTTING SUPPLIES) ×2 IMPLANT
CATH URETH DOVER 16FR FOLEY 2W LRG SMOOTH DRAIN EYE FIRM TIP SIL 10ML STRL LF  BLU STRP CLR (UROLOGICAL SUPPLIES) IMPLANT
COVER 53X24IN MAYOSTAND PRXM STRL DISP EQP SMS LF (DRAPE/PACKS/SHEETS/OR TOWEL) IMPLANT
DCNTR FLUID TRANSF DEV DISP STRL LF (IV TUBING & ACCESSORIES) ×2 IMPLANT
DRAPE FILTER SCRN FL CNTRL PCH DRAIN PORT GRAD 9-9.5IN UNDR BUTT CNVRT STRL SURG 1 3/8IN CLR (DRAPE/PACKS/SHEETS/OR TOWEL) IMPLANT
DRAPE OB FLUID COLLECT PCH ABS PAD GRAD MRK FILM 44X40IN UNDR BUTT STRL SURG PLASTIC POLY CLR (DRAPE/PACKS/SHEETS/OR TOWEL) IMPLANT
DRAPE OB LITH UNDR BUTT FLUID COLLECT PCH SUCT PORT GRAD MRK ADH CLSR 44X40IN STRL SURG POLY CLR (DRAPE/PACKS/SHEETS/OR TOWEL) ×2 IMPLANT
DRAPE TEAR RST LOW LINT 76X55IN LRG STRL SURG (DRAPE/PACKS/SHEETS/OR TOWEL) IMPLANT
DRAPE TWL PLASTIC ADH 23X17IN LRG STRDRP STRL SURG TRNSPR (DRAPE/PACKS/SHEETS/OR TOWEL) IMPLANT
ELECTRODE ESURG BLADE PNCL 10FT VLAB EDGE TELESCP SMOKE EVAC RCKR SWH SWVL CONNECTION NOZ (SURGICAL CUTTING SUPPLIES) IMPLANT
ELECTRODE PATIENT RTN 9FT VLAB C30- LB RM PHSV ACRL FOAM CORD NONIRRITATE NONSENSITIZE ADH STRP (SURGICAL CUTTING SUPPLIES) IMPLANT
GOWN SURG LRG STD LGTH REG L3 NONREINFORCE BRTHBL TWL STRL LF  DISP BLU HALYARD SPECTRUM SMS (DRAPE/PACKS/SHEETS/OR TOWEL) ×4 IMPLANT
GOWN SURG XL STD LGTH L3 NONREINFORCE HKLP CLSR TWL STRL LF DISP BLU SPECTRUM SMS (DRAPE/PACKS/SHEETS/OR TOWEL) ×4 IMPLANT
HANDPC SUCT PL STRL LF  DISP (MED SURG SUPPLIES) ×2 IMPLANT
LEGGINGS SURG 48X31IN FLUID COLLECT PCH SUCT DRAIN CUF SMS 6IN STRL (DRAPE/PACKS/SHEETS/OR TOWEL) IMPLANT
NEEDLE HYPO  23GA 1.5IN TW PRCSNGL SS POLYPROP REG BVL LL HUB DEHP-FR TRQS STRL LF  DISP (MED SURG SUPPLIES) IMPLANT
NEEDLE SPINAL BLK 3.5IN 22GA QUINCKE REG WL POLYPROP QUINCKE TIP STRL LF  DISP (MED SURG SUPPLIES) IMPLANT
PACKING WOUND 72X2IN GAUZE VAGINAL 4 PLY RADOPQ STRL DISP (WOUND CARE SUPPLY) IMPLANT
PEN SURG MRKNG SKIN RLR LRG RESERVOIR REG TIP LBL VIOL STRL LF  6IN (MED SURG SUPPLIES) IMPLANT
PENCIL SMOKE MANAGEMENT EDGE BLADE ELECTRODE 10FT (MED SURG SUPPLIES) IMPLANT
SLING URETH ARIS THK.3MM POLYPROP MACROPOROUS HTSL EDGE LOW ELASTICITY MONOF HELICAL INTROD TRNSTR (IMPLANTS GYNECOLOGIC) ×2 IMPLANT
SPONGE GAUZE 4X4IN COTTON 12 PLY LF  STRL DISP (WOUND CARE SUPPLY) IMPLANT
SPONGE SURG .25X9/16IN DSCT FOAM HLDR BLUNT RADOPQ LTX SAFE KTNR STRL DISP (MED SURG SUPPLIES) IMPLANT
SPONGE SURG 8X4IN 12 PLY RADOPQ BAND VISTEC STRL LF  BLU WHT (MED SURG SUPPLIES) ×2 IMPLANT
STRAP POSITION 19X3.5IN TECLIN FOAM SLIP RING STRUP 5IN LITH LF  DISP (MED SURG SUPPLIES) ×4 IMPLANT
SUTURE 2-0 CT2 VICRYL 27IN UNDYED BRD COAT ABS (SUTURE/WOUND CLOSURE) ×8 IMPLANT
SUTURE 2-0 SH VICRYL 27IN VIOL BRD COAT ABS (SUTURE/WOUND CLOSURE) IMPLANT
SUTURE 3-0 SH VICRYL 27IN VIOL BRD COAT ABS (SUTURE/WOUND CLOSURE) IMPLANT
SUTURE 4-0 PS2 MONOCRYL MTPS 18IN UNDYED MONOF ABS (SUTURE/WOUND CLOSURE) ×2 IMPLANT
SUTURE 4-0 PS2 VICRYL MTPS 18IN UNDYED BRD COAT ABS (SUTURE/WOUND CLOSURE) IMPLANT
SUTURE 4-0 RB1 VICRYL 27IN VIOL BRD COAT ABS (SUTURE/WOUND CLOSURE) IMPLANT
SYRINGE GENTLCARE 60CC STRL CATH TIP PEEL PCH SUCT TRANSLUC BULB POLYPROP THRMPLST ELASTO DISP (MED SURG SUPPLIES) ×2 IMPLANT
SYRINGE LL 10ML LF  STRL CONTROL CONCEN TIP PRGN FREE DEHP-FR MED DISP (MED SURG SUPPLIES) ×2 IMPLANT
SYRINGE LL 10ML LF  STRL GRAD N-PYRG DEHP-FR PVC FREE MED DISP (MED SURG SUPPLIES) ×2 IMPLANT
TIP SUCT YANKAUER STD BULB 6MM 10FT STRL LF  DISP (MED SURG SUPPLIES) ×2 IMPLANT
TRAY CATH 16FR FOLEY DRAIN BAG SPECI CONTAINR PREFL INFLAT SYRG PVP SIL 10ML 2000ML 10CC LF (UROLOGICAL SUPPLIES) ×2 IMPLANT
TRAY LITHOTOMY ~~LOC~~ - ~~LOC~~ HOSPITAL (CUSTOM TRAYS & PACK) ×2 IMPLANT
WATER STRL 1000ML PRSV FR N-PYRG DEHP-FR PLASTIC PR BTL AQLT LF (MED SURG SUPPLIES) ×2 IMPLANT

## 2023-10-14 NOTE — Anesthesia Preprocedure Evaluation (Signed)
 ANESTHESIA PRE-OP EVALUATION  Planned Procedure: VAGINAL TRANSOBTURATOR SLING W/ARIS GRAFT  REPAIR POSTERIOR  PERINEORRHAPHY  Review of Systems     anesthesia history negative     patient summary reviewed  nursing notes reviewed        Pulmonary   COPD, current smoker and Smoked in last 24 hours,   Cardiovascular    Hypertension, DOE, ECG reviewed and hyperlipidemia ,No peripheral edema,  Exercise Tolerance: > or = 4 METS        GI/Hepatic/Renal   negative GI/hepatic/renal ROS,         Endo/Other    hypothyroidism, osteoarthritis and diverticulitis,      Neuro/Psych/MS    back abnormality, fibromyalgia, depression, Neck problems    peripheral neuropathy,  Cancer                        Physical Assessment      Airway       Mallampati: II    TM distance: 3 FB    Neck ROM: full  Mouth Opening: fair.  No Facial hair  No Beard        Dental                      Pulmonary    Breath sounds clear to auscultation  (-) no rhonchi, no decreased breath sounds, no wheezes, no rales and no stridor     Cardiovascular    Rhythm: regular  Rate: Normal  (-) no friction rub, carotid bruit is not present, no peripheral edema and no murmur     Other findings              Plan  ASA 2     Planned anesthesia type: general     general anesthesia with endotracheal tube intubation      PONV Plan:  I plan to administer pharmcologic prophalaxis antiemetics              Intravenous induction       Anesthetic plan and risks discussed with patient  signed consent obtained      Use of blood products discussed with patient who consented to blood products.      Patient's NPO status is appropriate for Anesthesia.           Plan discussed with CRNA.    (Tylenol  and emend  in preopt )             BP Readings from Last 5 Encounters:   04/28/23 126/84   05/05/22 126/74   03/24/22 137/72   03/08/22 118/78   02/25/22 124/68       No data recorded    CBC  Diff   Lab Results   Component Value Date/Time    WBC 7.3 10/07/2023 11:55 AM    HGB 12.4 10/07/2023  11:55 AM    HCT 35.9 10/07/2023 11:55 AM    PLTCNT 215 10/07/2023 11:55 AM    RBC 3.84 (L) 10/07/2023 11:55 AM    MCV 93.5 10/07/2023 11:55 AM    MCHC 34.5 10/07/2023 11:55 AM    MCH 32.3 (H) 10/07/2023 11:55 AM    MPV 10.6 10/07/2023 11:55 AM    Lab Results   Component Value Date/Time    PMNS 52.5 10/07/2023 11:55 AM    MONOCYTES 6.2 10/07/2023 11:55 AM    BASOPHILS 1.2 10/07/2023 11:55 AM    BASOPHILS <0.10 10/07/2023 11:55 AM    PMNABS 3.81  10/07/2023 11:55 AM    LYMPHSABS 2.22 10/07/2023 11:55 AM    EOSABS 0.69 (H) 10/07/2023 11:55 AM    MONOSABS 0.45 10/07/2023 11:55 AM            Basic Metabolic Profile    Lab Results   Component Value Date/Time    SODIUM 136 10/07/2023 11:55 AM    POTASSIUM 4.5 10/07/2023 11:55 AM    CHLORIDE 102 10/07/2023 11:55 AM    CO2 28 10/07/2023 11:55 AM    ANIONGAP 6 10/07/2023 11:55 AM    Lab Results   Component Value Date/Time    BUN 17 10/07/2023 11:55 AM    CREATININE 1.03 10/07/2023 11:55 AM        No results found for: HA1C    Lab Results   Component Value Date    TSH 2.880 11/04/2021          Hepatic Function    Lab Results   Component Value Date/Time    ALBUMIN 3.3 (L) 11/04/2021 01:24 PM    TOTALPROTEIN 6.9 11/04/2021 01:24 PM    ALKPHOS 121 11/04/2021 01:24 PM    Lab Results   Component Value Date/Time    AST 22 11/04/2021 01:24 PM    ALT 17 11/04/2021 01:24 PM          Risks and benefits of anesthesia have been discussed, including PONV, corneal abrasion, sore throat, damage to the mouth/throat/teeth, difficult intubation, aspiration, line infections, stroke, MI, and even death.  The patient has had a chance to ask questions of the anesthesia team, and is in agreement with the anesthetic plan.      Fairy Joclyn Alsobrook DO

## 2023-10-14 NOTE — Anesthesia Procedure Notes (Signed)
 [facility]    Airway Note  General Information and Staff   Authorizing provider: Donzella Pac, DO  Performing provider: Alene Dunnings, CRNA        Reason: elective    Difficult airway    Indications and Patient Condition  Pt location: In Or  Indications for airway management: anesthesia and CNS depression  Sedation level: deep      Preoxygenated: yesPatient position: sniffing    Mask difficulty assessment: 1 - vent by mask        Final Airway Details    Final airway type: endotracheal airway        Successful airway: ETT and cuffed   Successful intubation technique: direct laryngoscopy              Blade: Macintosh  Blade size: #3.5  Airway size (mm): 7.5  Cormack-Lehane Classification: grade I - full view of glottis  Placement verified by: chest auscultation and capnometry   Marked at 22  Measured from: lips    Number of attempts at approach: 1  Number of other approaches attempted: 0Airway complications: Atraumatic

## 2023-10-14 NOTE — Anesthesia Transfer of Care (Signed)
 ANESTHESIA TRANSFER OF CARE   Vanessa [name] is a 68 y.o. ,female, Weight: 64.9 kg (143 lb)   had Procedure(s):  VAGINAL TRANSOBTURATOR SLING W/ARIS GRAFT  REPAIR POSTERIOR  PERINEORRHAPHY  CYSTOSCOPY  performed  10/14/23   Primary Service: Jerilynn Finder, MD    Past Medical History:   Diagnosis Date   . Allergic rhinitis    . Back problem    . Depression    . Disorder of thyroid     . Diverticulitis of colon    . Dyspnea on exertion    . Essential hypertension    . Fibromyalgia    . Hypothyroid    . Irritable bowel syndrome    . Neck problem    . Osteoarthritis    . Peripheral neuropathy    . Pulmonary emphysema     slight   . TMJ (dislocation of temporomandibular joint)    . Tremors of nervous system    . Vaginal infection     recurrent bv   . Varicosities of leg       Allergy History as of 10/14/23       ERYTHROMYCIN         Noted Status Severity Type Reaction    02/25/22 1126 Keren Gibney, Ambulatory Care Assistant 02/05/14 Active Low  Diarrhea,  Other Adverse Reaction (Add comment)    Comments: cramps   Other reaction(s): Muscle Pain, Other (See Comments)   cramps   cramps    Other reaction(s): Muscle Pain, Other (See Comments)   cramps   cramps    cramps               GABAPENTIN         Noted Status Severity Type Reaction    02/25/22 1126 Keren Gibney, Ambulatory Care Assistant 03/11/14 Active Low   Other Adverse Reaction (Add comment), Nausea/ Vomiting    Comments: Increased tremors   tremors   Other reaction(s): Other (See Comments)   Increased tremors   tremors   tremors    Other reaction(s): Other (See Comments)   Increased tremors   tremors   tremors    tremors               LEVOFLOXACIN         Noted Status Severity Type Reaction    02/25/22 1126 Keren Gibney, Ambulatory Care Assistant 04/21/21 Active Low  Nausea/ Vomiting    Comments: Other reaction(s): Other (See Comments)   GI pain.   GI pain.    Other reaction(s): Other (See Comments)   GI pain.    GI pain.                SULFAMETHOXAZOLE-TRIMETHOPRIM         Noted Status Severity Type Reaction    02/25/22 1126 Keren Gibney, Ambulatory Care Assistant 05/30/19 Active Low  Nausea/ Vomiting              TRAMADOL         Noted Status Severity Type Reaction    02/25/22 1126 Keren Gibney, Ambulatory Care Assistant 03/03/15 Active    Other Adverse Reaction (Add comment)    Comments: headaches   headaches   headaches   headaches    headaches   headaches    headaches               NABUMETONE         Noted Status Severity Type Reaction    02/25/22 1126 Keren Gibney, Ambulatory  Care Assistant 09/28/19 Active Medium  Rash    Comments: itching   itching   itching   itching    itching   itching    itching               NITROFURANTOIN         Noted Status Severity Type Reaction    02/25/22 1126 Keren Gibney, Ambulatory Care Assistant 12/10/14 Active Low  Diarrhea, Nausea/ Vomiting              OSELTAMIVIR         Noted Status Severity Type Reaction    02/25/22 1126 Keren Gibney, Ambulatory Care Assistant 09/25/14 Active Low  Diarrhea, Nausea/ Vomiting    Comments: Cramps and stomach issues   Other reaction(s): Other (See Comments)   Cramps and stomach issues    Other reaction(s): Other (See Comments)   Cramps and stomach issues    Cramps and stomach issues               TRAZODONE         Noted Status Severity Type Reaction    02/25/22 1126 Keren Gibney, Ambulatory Care Assistant 02/25/22 Active                 NITROFURANTOIN MONOHYD/M-CRYST         Noted Status Severity Type Reaction    10/12/23 1453 Osa Iha, RN 10/12/23 Active       Comments: Severe cramping      10/12/23 1452 Osa Iha, RN 10/12/23 Active                 AMOXICILLIN-POT CLAVULANATE         Noted Status Severity Type Reaction    10/12/23 1453 Osa Iha, RN 10/12/23 Active                 ACETAMINOPHEN -CODEINE         Noted Status Severity Type Reaction    10/12/23 1454 Osa Iha, RN 10/12/23 Active       Comments:  Dizzy                CETIRIZINE         Noted Status Severity Type Reaction    10/14/23 0906 Jodie No, RN 10/14/23 Active Medium Topical     Comments: Rash and Blisters                   I completed my transfer of care / handoff to the receiving personnel during which we discussed:  Access, Airway, All key/critical aspects of case discussed, Analgesia, Antibiotics, Expectation of post procedure, Fluids/Product, Gave opportunity for questions and acknowledgement of understanding, Labs and PMHx      Post Location: PACU                                                           Last OR Temp: Temperature: 36 C (96.8 F)  ABG:  POTASSIUM   Date Value Ref Range Status   10/07/2023 4.5 3.5 - 5.1 mmol/L Final     KETONES   Date Value Ref Range Status   07/05/2023 Negative Negative mg/dL Final   94/77/7974 Negative Negative mg/dL Final     CALCIUM   Date Value Ref Range Status   10/07/2023  9.2 8.6 - 10.3 mg/dL Final     Comment:     Gadolinium-containing contrast can interfere with calcium measurement.       Calculated P Axis   Date Value Ref Range Status   10/07/2023 -11 degrees Final     Calculated R Axis   Date Value Ref Range Status   10/07/2023 -12 degrees Final     Calculated T Axis   Date Value Ref Range Status   10/07/2023 12 degrees Final     CANDIDA GLABRATA   Date Value Ref Range Status   02/25/2022 Negative Negative Final     CANDIDA SPECIES   Date Value Ref Range Status   02/25/2022 Negative Negative Final     Comment:     A negative result does not preclude a possible infection because results are dependent on adequate specimen collection.     Airway:* No LDAs found *  Blood pressure (!) 103/56, pulse 68, temperature 36 C (96.8 F), resp. rate 16, height 1.651 m (5' 5), weight 64.9 kg (143 lb), SpO2 100%.

## 2023-10-14 NOTE — Anesthesia Postprocedure Evaluation (Signed)
 Anesthesia Post Op Evaluation    Patient: Vanessa [name]  Procedure(s):  VAGINAL TRANSOBTURATOR SLING W/ARIS GRAFT  REPAIR POSTERIOR  PERINEORRHAPHY  CYSTOSCOPY    Last Vitals:Temperature: 36 C (96.8 F) (10/14/23 1121)  Heart Rate: 64 (10/14/23 1145)  BP (Non-Invasive): (!) 99/56 (10/14/23 1145)  Respiratory Rate: 16 (10/14/23 1145)  SpO2: 97 % (10/14/23 1145)    No notable events documented.    Patient is sufficiently recovered from the effects of anesthesia to participate in the evaluation and has returned to their pre-procedure level.  Patient location during evaluation: PACU       Patient participation: complete - patient participated  Level of consciousness: awake and alert and responsive to verbal stimuli  Multimodal Pain Management: Multimodal analgesia used between 6 hours prior to anesthesia start to PACU discharge  Pain score: 2  Pain management: adequate  Airway patency: patent    Anesthetic complications: no  Cardiovascular status: acceptable  Respiratory status: acceptable  Hydration status: acceptable  Patient post-procedure temperature: Pt Normothermic   PONV Status: Absent

## 2023-10-14 NOTE — H&P (Signed)
 [facility] of Obstetrics & Gynecology      Preoperative History and Physical      DATE OF SERVICE: 10/14/2023, 08:58   PATIENT: Vanessa [name]  DOB: [birth year]   CHART NUMBER: [id number]    History of Present Illness: This is a 68 y.o. female who presents for scheduled vaginal transobturator sling/TOT, posterior colporrhaphy with perineorrhaphy for rectocele and stress urinary incontinence. She reports she is doing well today and denies any complaints. Specifically denies CP, SOB, abdominal pain, N/V.     LMP: No LMP recorded (lmp unknown). Patient has had a hysterectomy.     Problem List[1]    Past Medical History:   Diagnosis Date    Allergic rhinitis     Back problem     Depression     Disorder of thyroid      Diverticulitis of colon     Dyspnea on exertion     Essential hypertension     Fibromyalgia     Hypothyroid     Irritable bowel syndrome     Neck problem     Osteoarthritis     Peripheral neuropathy     Pulmonary emphysema     slight    TMJ (dislocation of temporomandibular joint)     Tremors of nervous system     Vaginal infection     recurrent bv    Varicosities of leg            Past Surgical History:   Procedure Laterality Date    ABDOMINAL SACROCOLPOPEXY N/A 04/22/2021    at [facility]    EYE SURGERY      not sure which one    HX HYSTERECTOMY  04/22/2021    HX TLHBSO Bilateral 04/22/2021    TLH/BSO    VARICOSE VEIN SURGERY             Social History[2]    Allergies[3]     Medications Prior to Admission       Prescriptions    acetaminophen -codeine (TYLENOL  #3) 300-30 mg Oral Tablet    Take 1 Tablet by mouth Every night    amLODIPine (NORVASC) 5 mg Oral Tablet    Take 0.5 Tablets (2.5 mg total) by mouth Daily    B1-B3-B6-E-calcium-mag-chaste (AZO HORMONAL HEALTH CYCLE CARE) 100 mg-50 mg- 50 mg-15 mg Oral Tablet    Take by mouth    cal/vit B12/folic acid/vit B6 (CALCIUM-VITAMINS B6-B12-FA ORAL)    Take by mouth    calcium carbonate (TUMS) 200 mg calcium (500 mg) Oral Tablet, Chewable    Chew  1 Tablet (500 mg total) Daily    Patient not taking:  Reported on 10/14/2023    cholecalciferol, vitamin D3, 25 mcg (1,000 unit) Oral Tablet    Take 1 Tablet (1,000 Units total) by mouth    DULoxetine (CYMBALTA DR) 60 mg Oral Capsule, Delayed Release(E.C.)    Take 1 Capsule (60 mg total) by mouth Every night    famotidine (PEPCID) 20 mg Oral Tablet    Take 1 Tablet (20 mg total) by mouth Every night    ibuprofen  (MOTRIN ) 400 mg Oral Tablet    Take 1 Tablet (400 mg total) by mouth Once a day    levothyroxine (SYNTHROID) 50 mcg Oral Tablet    levothyroxine (SYNTHROID) 75 mcg Oral Tablet    PLEASE SEE ATTACHED FOR DETAILED DIRECTIONS    Patient not taking:  Reported on 10/14/2023    magnesium citrate (CITROMA) Oral Solution    Take  296 mL by mouth One time    magnesium oxide (MAG-OX) 400 mg Oral Tablet    Take 1 Tablet (400 mg total) by mouth    methocarbamoL (ROBAXIN) 500 mg Oral Tablet    Take 1 Tablet (500 mg total) by mouth Every night    metroNIDAZOLE  (METROGEL -VAGINAL) 0.75 % (37.5mg /5 gram) Vaginal Gel    Insert 1 Applicator into the vagina Every night Use one applicator intravaginally nightly for 5 nights, then 1 applicator weekly for 6 months.    multivitamin Oral Tablet    Take 1 Tablet by mouth    nystatin (MYCOSTATIN) 100,000 unit/mL Oral Suspension    Patient not taking:  Reported on 10/14/2023    potassium chloride (K-DUR) 10 mEq Oral Tab Sust.Rel. Particle/Crystal    Take 1 Tablet (10 mEq total) by mouth Daily    pregabalin (LYRICA) 100 mg Oral Capsule    TAKE 1 CAPSULE BY MOUTH EVERY MORNING AND 1 CAPSULE EVERY NIGHT    pregabalin (LYRICA) 50 mg Oral Capsule    Take 3 Capsules (150 mg total) by mouth Every night    propranoloL  (INDERAL ) 40 mg Oral Tablet    Take 1 Tablet (40 mg total) by mouth Twice daily    psyllium (METAMUCIL) Packet    Take 1 Packet by mouth Twice daily    tamsulosin  (FLOMAX ) 0.4 mg Oral Capsule    Take 1 Capsule (0.4 mg total) by mouth Once a day    triamcinolone acetonide 0.1 % Ointment     APPLY TWICE DAILY TO AFFECTED AREA FOR TWO WEEKS, TAKE A TWO WEEK BREAK AND REPEAT AS NEEDED.    vitamin E 400 unit Oral Capsule    Take 1 Capsule (400 Units total) by mouth Daily    zinc Sulfate (ZINCATE) 50 mg zinc (220 mg) Oral Tablet    Take 1 Tablet (50 mg total) by mouth Daily    zolpidem (AMBIEN) 10 mg Oral Tablet    Take 1 Tablet (10 mg total) by mouth Daily            Allergies[4]    Social History[5]    Physical Exam  Filed Vitals:    10/14/23 0822   BP: 117/62   Pulse: 68   Resp: 18   Temp: 36 C (96.8 F)   SpO2: 97%       General:  NAD, well-appearing  HEENT:  Atraumatic, normocephalic, MMM  Resp:  CTAB  CV:  RRR, no m/r/g  Abdo:  NTND  Ext:  No cyanosis or edema  Neuro:  CN II-XII grossly intact  Skin:  No apparent rashes or lesions    1. Preoperative State  - The patient's consent was previously reviewed by Dr. PETERSON.  All risks, benefits, and alternatives were discussed.  The patient wishes to proceed. She has been NPO since midnight.   - Continues to be appropriate surgical candidate  - Will proceed with procedure as planned    JERILYNN JINNY PETERSON, MD [facility] 10/14/2023 08:58  [place]  [place]  Department of Obstetrics & Gynecology       [1]   Patient Active Problem List  Diagnosis    Symptomatic menopausal or female climacteric states    Strain of knee    Recurrent major depression    Polyp of colon    Osteoporosis    Hypothyroid    Hypertension    Hyperlipidemia    History of abnormal cervical Pap smear    Fibromyalgia  Neuropathy (CMS HCC)    Benign essential tremor    Aortic atherosclerosis (CMS HCC)    Baden-Walker grade 2 rectocele    Primary stress urinary incontinence    Prolapse of female pelvic organs   [2]   Social History  Tobacco Use    Smoking status: Every Day     Current packs/day: 1.00     Average packs/day: 1 pack/day for 49.7 years (49.7 ttl pk-yrs)     Types: Cigarettes     Start date: 1976    Smokeless tobacco: Never   Vaping Use    Vaping status: Never Used   Substance Use  Topics    Alcohol use: Not Currently    Drug use: Never   [3]   Allergies  Allergen Reactions    Nabumetone Rash     itching   itching   itching   itching    itching   itching    itching    Augmentin [Amoxicillin-Pot Clavulanate]     Macrobid [Nitrofurantoin Monohyd/M-Cryst]      Severe cramping     Tramadol  Other Adverse Reaction (Add comment)     headaches   headaches   headaches   headaches    headaches   headaches    headaches    Trazodone     Tylenol -Codeine #4 [Acetaminophen -Codeine]      Dizzy     Erythromycin Diarrhea and  Other Adverse Reaction (Add comment)     cramps   Other reaction(s): Muscle Pain, Other (See Comments)   cramps   cramps    Other reaction(s): Muscle Pain, Other (See Comments)   cramps   cramps    cramps    Gabapentin  Other Adverse Reaction (Add comment) and Nausea/ Vomiting     Increased tremors   tremors   Other reaction(s): Other (See Comments)   Increased tremors   tremors   tremors    Other reaction(s): Other (See Comments)   Increased tremors   tremors   tremors    tremors    Levofloxacin Nausea/ Vomiting     Other reaction(s): Other (See Comments)   GI pain.   GI pain.    Other reaction(s): Other (See Comments)   GI pain.    GI pain.    Nitrofurantoin Diarrhea and Nausea/ Vomiting    Oseltamivir Diarrhea and Nausea/ Vomiting     Cramps and stomach issues   Other reaction(s): Other (See Comments)   Cramps and stomach issues    Other reaction(s): Other (See Comments)   Cramps and stomach issues    Cramps and stomach issues    Sulfamethoxazole-Trimethoprim Nausea/ Vomiting   [4]   Allergies  Allergen Reactions    Nabumetone Rash     itching   itching   itching   itching    itching   itching    itching    Augmentin [Amoxicillin-Pot Clavulanate]     Macrobid [Nitrofurantoin Monohyd/M-Cryst]      Severe cramping     Tramadol  Other Adverse Reaction (Add comment)     headaches   headaches   headaches   headaches    headaches   headaches    headaches    Trazodone     Tylenol -Codeine #4  [Acetaminophen -Codeine]      Dizzy     Erythromycin Diarrhea and  Other Adverse Reaction (Add comment)     cramps   Other reaction(s): Muscle Pain, Other (See Comments)   cramps  cramps    Other reaction(s): Muscle Pain, Other (See Comments)   cramps   cramps    cramps    Gabapentin  Other Adverse Reaction (Add comment) and Nausea/ Vomiting     Increased tremors   tremors   Other reaction(s): Other (See Comments)   Increased tremors   tremors   tremors    Other reaction(s): Other (See Comments)   Increased tremors   tremors   tremors    tremors    Levofloxacin Nausea/ Vomiting     Other reaction(s): Other (See Comments)   GI pain.   GI pain.    Other reaction(s): Other (See Comments)   GI pain.    GI pain.    Nitrofurantoin Diarrhea and Nausea/ Vomiting    Oseltamivir Diarrhea and Nausea/ Vomiting     Cramps and stomach issues   Other reaction(s): Other (See Comments)   Cramps and stomach issues    Other reaction(s): Other (See Comments)   Cramps and stomach issues    Cramps and stomach issues    Sulfamethoxazole-Trimethoprim Nausea/ Vomiting   [5]   Social History  Tobacco Use    Smoking status: Every Day     Current packs/day: 1.00     Average packs/day: 1 pack/day for 49.7 years (49.7 ttl pk-yrs)     Types: Cigarettes     Start date: 1976    Smokeless tobacco: Never   Vaping Use    Vaping status: Never Used   Substance Use Topics    Alcohol use: Not Currently    Drug use: Never

## 2023-10-24 NOTE — OR Surgeon (Signed)
 [facility]  OPERATIVE NOTE    Patient Name: Vanessa [name] [facility] Number: [id number]  Date of Service: 10/24/2023  Date of Birth: 1955-07-31      Pre-Operative Diagnosis:   Stress urinary incontinence  Rectocele  Post-Operative Diagnosis:   Same  Procedure(s)/Description:    Vaginal transobturator sling/transobturator tape  Posterior colporrhaphy  Perineorrhaphy  Findings/Complexity (inherent to the procedure performed):   Grade 3 rectocele loss of urine with cough laugh sneeze    Attending Surgeon:  Jerilynn JINNY Finder, MD  Assistant(s):  Leonor Lee PA-C    Anesthesia Type: General endotracheal anesthesia  Estimated Blood Loss:  less than 100 ml  Blood Given:  0 mL  Fluids Given:  None  Complications (unintended/unexpected/iatrogenic/accidental/inadvertent events):  None  Wound Class: Clean Contaminated Wounds -Respiratory, GI, Genital, or Urinary    Tubes: None  Drains: Foley to gravity  Specimens/ Cultures:  None  Implants: ARIS TOT graft           Disposition: PACU - hemodynamically stable.  Condition: stable    INDICATION FOR PROCEDURE:  Urinary incontinence and large rectocele    DESCRIPTION OF THE PROCEDURE   The patient was taken to the operating room and venodynes were placed and pumping prior to induction of adequate general anesthesia. The patient was placed in the dorsal lithotomy position and was prepped and draped in the usual sterile fashion. A surgical pause was performed as per protocol.   After sterile preparation a Foley catheter was inserted in the urethra it glue was inflated with 10 mL sterile water.  The urethrovesical junction was identified by palpation of the Foley balloon through the vaginal mucosa.  An Allis clamp was placed along the vaginal mucosa at the level of the urethrovesical junction.  A 2nd Allis clamp was placed approximately 1/2 cm below the urethral meatus.  Local anesthetic was infiltrated and incision was made with a 15 blade between the 2 Allis  clamps.  This dissection was extended laterally with the use of Metzenbaum scissors dissecting the vaginal mucosa away from the urethra and periurethral tissues out to the level of the pubic ramus bilaterally.  Incisions were made in the skin overlying the superior medial border of the obturator foramen bilaterally.  This was identified by palpation.  The transobturator hooks were then placed through the skin incisions through the fascia of the superior medial border of the obturator foramen and out into the region of dissection in the vaginal mucosa.  The ARIS graft was attached to the hooks on either side and brought through to the skin incisions with the help of the hooks.  Cystoscopy was then performed with a 24 French cystoscope utilizing a 70 degree lens and there was no evidence of injury to the bladder no evidence of any needle, graft, suture material in the bladder and normal spill of urine from either ureteral opening.  Cystoscopy was then concluded the bladder drained and the Foley catheter replaced.  The transobturator sling was then adjusted using a tension-free technique and excess graft was trimmed at the skin edges and the vaginal mucosal incision and skin incisions in the groin were closed with 2 0 Vicryl and 4-0 Vicryl respectively.     Next, a diamond-shaped portion of vaginal epithelium was trimmed from perineum.   The vaginal mucosa was incised in the midline and the rectum was dissected away from the vaginal mucosa out to the perirectal space bilaterally.  Three plicating sutures of 2-0 Vicryl were  placed to repair of the rectocele.  Rectal examination revealed no evidence of suture placement within the rectum.  Excess vaginal mucosa was then trimmed.  Vaginal mucosa was then approximated with Vicryl suture to the hymenal ring and the perineal body was reapproximated with sutures of 0 Vicryl and subcuticular sutures of 0 Vicryl were then used to reapproximate the perineal skin edges.    All  needle and sponge instrument counts were determined correct and the patient tolerated procedure quite well.  She was then transferred to recovery room in stable condition.    The physician assistant was present and assisted with the entire procedure    JERILYNN JINNY FINDER, MD  Department of Obstetrics and Gynecology  [facility]

## 2023-10-27 ENCOUNTER — Encounter (INDEPENDENT_AMBULATORY_CARE_PROVIDER_SITE_OTHER): Payer: Self-pay | Admitting: PHYSICIAN ASSISTANT

## 2023-10-27 ENCOUNTER — Other Ambulatory Visit: Payer: Self-pay

## 2023-10-27 ENCOUNTER — Ambulatory Visit (INDEPENDENT_AMBULATORY_CARE_PROVIDER_SITE_OTHER): Payer: Self-pay | Admitting: PHYSICIAN ASSISTANT

## 2023-10-27 VITALS — BP 104/60 | Wt 141.8 lb

## 2023-10-27 DIAGNOSIS — Z4889 Encounter for other specified surgical aftercare: Secondary | ICD-10-CM

## 2023-10-31 ENCOUNTER — Encounter (INDEPENDENT_AMBULATORY_CARE_PROVIDER_SITE_OTHER): Payer: Self-pay | Admitting: PHYSICIAN ASSISTANT

## 2023-10-31 NOTE — Progress Notes (Signed)
 Postop visit    Vanessa [name] is a 68 y/o female S/P uncomplicated TOT sling/posterior colporrhaphy/perineorrhaphy on 10/26/2023.     Path   N/A    Patient states that she is doing well. No complaints. No vaginal bleeding or significant postoperative pain.    ROS:   Denies any fevers or chills, bladder or bowel complaints. All other systems are negative to review.    Physical Examination:  Vitals:    10/27/23 1117   BP: 104/60   Weight: 64.3 kg (141 lb 12.1 oz)     General: Pleasant female in NAD  Skin: intact, without lesions  Incision: clean and dry and intact without evidence of erythema or induration  Neurologic: Alert and oriented  Psychiatric: Mood stable    Gynecologic:    Vulva: Without lesions   Vagina: Intact site of TOT sling/POP repair    Assessment/plan:      ICD-10-CM    1. Postoperative visit  Z48.89            No orders of the defined types were placed in this encounter.      1.  Postoperative visit    The patient is doing well postoperatively. RTO in 4 weeks for postop visit. Postoperative restrictions discussed.     Leonor Lee, PA-C  [facility] Department of Obstetrics and Gynecology        I reviewed the APP's note.  I agree with the findings and plan of care as documented in the APP's note.  Any exceptions/additions are edited/noted.    Jerilynn Finder, MD

## 2023-11-07 ENCOUNTER — Ambulatory Visit (INDEPENDENT_AMBULATORY_CARE_PROVIDER_SITE_OTHER): Payer: Self-pay | Admitting: PHYSICIAN ASSISTANT

## 2023-11-07 ENCOUNTER — Other Ambulatory Visit (INDEPENDENT_AMBULATORY_CARE_PROVIDER_SITE_OTHER): Payer: Self-pay | Admitting: PHYSICIAN ASSISTANT

## 2023-11-07 MED ORDER — FLUCONAZOLE 150 MG TABLET
150.0000 mg | ORAL_TABLET | Freq: Once | ORAL | 0 refills | Status: DC
Start: 2023-11-07 — End: 2023-11-07

## 2023-11-07 MED ORDER — METRONIDAZOLE 0.75 % (37.5 MG/5 GRAM) VAGINAL GEL
1.0000 | Freq: Every day | VAGINAL | 0 refills | Status: DC
Start: 2023-11-07 — End: 2023-11-14

## 2023-11-07 MED ORDER — FLUCONAZOLE 150 MG TABLET
150.0000 mg | ORAL_TABLET | Freq: Once | ORAL | 0 refills | Status: DC
Start: 2023-11-07 — End: 2023-11-14

## 2023-11-07 MED ORDER — METRONIDAZOLE 0.75 % (37.5 MG/5 GRAM) VAGINAL GEL
1.0000 | Freq: Every day | VAGINAL | 0 refills | Status: DC
Start: 2023-11-07 — End: 2023-11-07

## 2023-11-07 NOTE — Nursing Note (Signed)
 Lennie Best, PA-C sent to Steffan Mar, RN  Rx provided for Diflucan x 1 dose and Metrogel  x 5 days. If her sx don't improve despite tx, she should contact the office    Pt notified of new rx at her pharmacy. It will need changed to her local pharmacy & Best will change it.  She will call if symptoms persist   Mar Steffan, RN  11/07/2023 13:37

## 2023-11-07 NOTE — Telephone Encounter (Signed)
-----   Message from Leonor Lee, [place] sent at 11/07/2023  1:19 PM EDT -----  Regarding: RE: Clinical Question  ----- Message from Vina HERO sent at 11/07/2023 10:18 AM EDT -----  Copied From CRM [id number].  Dorrie Orie CROME (Self) called with a clinical question.     Vanessa [name]  [name] states she had surgery about a month ago and is now experiencing soreness, redness, pain and some discharge in the vaginal area.   She thinks she may have an antibiotic.    Thank You  Paula McMillen

## 2023-11-10 ENCOUNTER — Encounter (INDEPENDENT_AMBULATORY_CARE_PROVIDER_SITE_OTHER): Payer: Self-pay

## 2023-11-11 ENCOUNTER — Ambulatory Visit (INDEPENDENT_AMBULATORY_CARE_PROVIDER_SITE_OTHER): Payer: Self-pay | Admitting: PHYSICIAN ASSISTANT

## 2023-11-11 ENCOUNTER — Other Ambulatory Visit: Payer: Self-pay

## 2023-11-11 ENCOUNTER — Encounter (INDEPENDENT_AMBULATORY_CARE_PROVIDER_SITE_OTHER): Payer: Self-pay | Admitting: NURSE PRACTITIONER, FAMILY

## 2023-11-11 ENCOUNTER — Ambulatory Visit: Attending: NURSE PRACTITIONER, FAMILY

## 2023-11-11 ENCOUNTER — Ambulatory Visit (INDEPENDENT_AMBULATORY_CARE_PROVIDER_SITE_OTHER): Admitting: NURSE PRACTITIONER, FAMILY

## 2023-11-11 ENCOUNTER — Other Ambulatory Visit: Attending: NURSE PRACTITIONER, FAMILY | Admitting: NURSE PRACTITIONER, FAMILY

## 2023-11-11 VITALS — BP 106/60 | Ht 65.0 in | Wt 142.9 lb

## 2023-11-11 DIAGNOSIS — R102 Pelvic and perineal pain unspecified side: Secondary | ICD-10-CM

## 2023-11-11 DIAGNOSIS — N9489 Other specified conditions associated with female genital organs and menstrual cycle: Secondary | ICD-10-CM

## 2023-11-11 DIAGNOSIS — R300 Dysuria: Secondary | ICD-10-CM

## 2023-11-11 MED ORDER — ESTRADIOL 0.01% (0.1 MG/GRAM) VAGINAL CREAM
TOPICAL_CREAM | VAGINAL | 0 refills | Status: DC
Start: 2023-11-11 — End: 2023-11-15

## 2023-11-11 MED ORDER — CEFADROXIL 500 MG CAPSULE
500.0000 mg | ORAL_CAPSULE | Freq: Two times a day (BID) | ORAL | 0 refills | Status: AC
Start: 2023-11-11 — End: 2023-11-18

## 2023-11-11 NOTE — Nursing Note (Signed)
 Pt coming today at 10am with RONAL Sharps, CRNP

## 2023-11-11 NOTE — Telephone Encounter (Signed)
-----   Message from Katheryn GAILS, RN sent at 11/11/2023  8:20 AM EDT -----  Regarding: FW: Clinical Question  ----- Message from [place] ORN sent at 11/10/2023 10:52 AM EDT -----  Copied From CRM #[id number].  Vanessa [name] (Self) called with a clinical question.   [facility] Pt     Pt states she has been getting treated for BV/yeast. States she finished the following medications. States she is having burning and severe itchiness on the folds of her vagina. She is wanting a call back regarding this. Please advise.    metroNIDAZOLE  (METROGEL -VAGINAL) 0.75 % (37.5mg /5 gram) Vaginal Gel  fluconazole (DIFLUCAN) 150 mg Oral Tablet    [facility] - [place], [place] - [address]   [address] [place] [address]   Phone: [phone] Fax: [phone]   Hours: Not open 24 hours       Thank you,  Randa

## 2023-11-13 ENCOUNTER — Ambulatory Visit (INDEPENDENT_AMBULATORY_CARE_PROVIDER_SITE_OTHER): Payer: Self-pay | Admitting: NURSE PRACTITIONER, FAMILY

## 2023-11-14 LAB — VAGINITIS PATHOGENS: BACTERIAL, CANDIDA, AND TV PANEL, TMA
BACTERIAL VAGINOSIS: NEGATIVE
CANDIDA GLABRATA: NEGATIVE
CANDIDA SPECIES: NEGATIVE
TRICHOMONAS VAGINALIS: NEGATIVE

## 2023-11-14 NOTE — Progress Notes (Signed)
 Department of Obstetrics and Gynecology      HPI:  Vanessa [name] 67 y.o. presents for concerns of continued vaginal pain along with burning with urination. She underwent TOT sling/posterior colporrhaphy/perineorrhaphy on 10/26/2023. She reports that she began having increasing pain that was making it difficult for her to sit. The pain has been worsening over the past week. She has been prescribed Diflucan along with metrogel  to help with her symptoms; however, she denies any improvement.      ROS:  Constitutional: negative  Eyes: negative  Ears, nose, mouth, throat, and face: negative  Respiratory: negative  Cardiovascular: negative  Gastrointestinal: negative  Genitourinary:positive for vaginal pain s/p surgery  Integument/breast: negative  Hematologic/lymphatic: negative  Musculoskeletal:negative  Neurological: negative  Behavioral/Psych: negative  Endocrine: negative  Allergic/Immunologic: negative      Past Medical History:  Current Outpatient Medications   Medication Sig    acetaminophen -codeine (TYLENOL  #3) 300-30 mg Oral Tablet Take 1 Tablet by mouth Every night    amLODIPine (NORVASC) 5 mg Oral Tablet Take 0.5 Tablets (2.5 mg total) by mouth Daily    B1-B3-B6-E-calcium-mag-chaste (AZO HORMONAL HEALTH CYCLE CARE) 100 mg-50 mg- 50 mg-15 mg Oral Tablet Take by mouth    cal/vit B12/folic acid/vit B6 (CALCIUM-VITAMINS B6-B12-FA ORAL) Take by mouth    calcium carbonate (TUMS) 200 mg calcium (500 mg) Oral Tablet, Chewable Chew 1 Tablet (500 mg total) Daily (Patient not taking: Reported on 11/11/2023)    cefadroxil (DURICEF) 500 mg Oral Capsule Take 1 Capsule (500 mg total) by mouth Twice daily for 7 days    cholecalciferol, vitamin D3, 25 mcg (1,000 unit) Oral Tablet Take 1 Tablet (1,000 Units total) by mouth    docusate sodium  (COLACE) 100 mg Oral Capsule Take 1 Capsule (100 mg total) by mouth Twice per day as needed for Constipation    DULoxetine (CYMBALTA DR) 60 mg Oral Capsule, Delayed Release(E.C.) Take 1  Capsule (60 mg total) by mouth Every night    estradioL (ESTRACE) 0.01 % (0.1 mg/gram) Vaginal Cream Apply a pea sized amount on area of tenderness daily.    famotidine (PEPCID) 20 mg Oral Tablet Take 1 Tablet (20 mg total) by mouth Every night    levothyroxine (SYNTHROID) 50 mcg Oral Tablet     magnesium citrate (CITROMA) Oral Solution Take 296 mL by mouth One time    magnesium oxide (MAG-OX) 400 mg Oral Tablet Take 1 Tablet (400 mg total) by mouth    methocarbamoL (ROBAXIN) 500 mg Oral Tablet Take 1 Tablet (500 mg total) by mouth Every night    multivitamin Oral Tablet Take 1 Tablet by mouth    potassium chloride (K-DUR) 10 mEq Oral Tab Sust.Rel. Particle/Crystal Take 1 Tablet (10 mEq total) by mouth Daily    pregabalin (LYRICA) 100 mg Oral Capsule TAKE 1 CAPSULE BY MOUTH EVERY MORNING AND 1 CAPSULE EVERY NIGHT    pregabalin (LYRICA) 50 mg Oral Capsule Take 3 Capsules (150 mg total) by mouth Every night    propranoloL  (INDERAL ) 40 mg Oral Tablet Take 1 Tablet (40 mg total) by mouth Twice daily    psyllium (METAMUCIL) Packet Take 1 Packet by mouth Twice daily    triamcinolone acetonide 0.1 % Ointment APPLY TWICE DAILY TO AFFECTED AREA FOR TWO WEEKS, TAKE A TWO WEEK BREAK AND REPEAT AS NEEDED.    vitamin E 400 unit Oral Capsule Take 1 Capsule (400 Units total) by mouth Daily    zinc Sulfate (ZINCATE) 50 mg zinc (220 mg) Oral Tablet  Take 1 Tablet (50 mg total) by mouth Daily    zolpidem (AMBIEN) 10 mg Oral Tablet Take 1 Tablet (10 mg total) by mouth Daily     Allergies[1]  Past Medical History:   Diagnosis Date    Allergic rhinitis     Back problem     Depression     Disorder of thyroid      Diverticulitis of colon     Dyspnea on exertion     Essential hypertension     Fibromyalgia     Hypothyroid     Irritable bowel syndrome     Neck problem     Osteoarthritis     Peripheral neuropathy     Pulmonary emphysema     slight    TMJ (dislocation of temporomandibular joint)     Tremors of nervous system     Vaginal  infection     recurrent bv    Varicosities of leg          Past Surgical History:   Procedure Laterality Date    ABDOMINAL SACROCOLPOPEXY N/A 04/22/2021    at [facility]    EYE SURGERY      not sure which one    HX HYSTERECTOMY  04/22/2021    HX TLHBSO Bilateral 04/22/2021    TLH/BSO    VARICOSE VEIN SURGERY           OB History   Gravida Para Term Preterm AB Living   2 1 1  1 1    SAB IAB Ectopic Multiple Live Births   1          # Outcome Date GA Lbr Len/2nd Weight Sex Type Anes PTL Lv   2 SAB            1 Term              Family Medical History:       Problem Relation (Age of Onset)    Breast Cancer Maternal Grandmother, Maternal Aunt    No Known Problems Father, Sister, Brother, Maternal Grandfather, Paternal Grandmother, Paternal Grandfather, Daughter, Son, Maternal Uncle, Paternal Aunt, Paternal Uncle    Osteoporosis Mother    Uterine Fibroids Other            Social History     Socioeconomic History    Marital status: Single   Tobacco Use    Smoking status: Every Day     Current packs/day: 1.00     Average packs/day: 1 pack/day for 49.8 years (49.8 ttl pk-yrs)     Types: Cigarettes     Start date: 1976    Smokeless tobacco: Never   Vaping Use    Vaping status: Never Used   Substance and Sexual Activity    Alcohol use: Not Currently    Drug use: Never    Sexual activity: Not Currently     Birth control/protection: Surgical   Other Topics Concern    Ability to Walk 1 Flight of Steps without SOB/CP Yes    Routine Exercise No    Ability to Walk 2 Flight of Steps without SOB/CP No    Unable to Ambulate No    Total Care No    Ability To Do Own ADL's Yes    Uses Walker No    Other Activity Level No    Uses Cane No     Social Determinants of Health     Financial Resource Strain: Low Risk  (10/27/2020)    Received from  [facility]    Overall Financial Resource Strain (CARDIA)     Difficulty of Paying Living Expenses: Not hard at all   Transportation Needs: No Transportation Needs (10/27/2020)    Received from [facility] - Transportation     Lack of Transportation (Medical): No     Lack of Transportation (Non-Medical): No   Social Connections: Socially Isolated (09/27/2018)    Received from [facility]    Social Connection and Isolation Panel     In a typical week, how many times do you talk on the phone with family, friends, or neighbors?: More than three times a week     How often do you get together with friends or relatives?: Never     How often do you attend church or religious services?: Never     Do you belong to any clubs or organizations such as church groups, unions, fraternal or athletic groups, or school groups?: No     How often do you attend meetings of the clubs or organizations you belong to?: Never     Are you married, widowed, divorced, separated, never married, or living with a partner?: Divorced   Intimate Partner Violence: Not At Risk (09/27/2018)    Received from [facility]    Humiliation, Afraid, Rape, and Kick questionnaire     Within the last year, have you been afraid of your partner or ex-partner?: No     Within the last year, have you been humiliated or emotionally abused in other ways by your partner or ex-partner?: No     Within the last year, have you been kicked, hit, slapped, or otherwise physically hurt by your partner or ex-partner?: No     Within the last year, have you been raped or forced to have any kind of sexual activity by your partner or ex-partner?: No        Physical Exam:   BP 106/60 (Site: Right Arm, Patient Position: Sitting)   Ht 1.651 m (5' 5)   Wt 64.8 kg (142 lb 13.7 oz)   LMP  (LMP Unknown)   BMI 23.77 kg/m         General: Normal  Lungs: clear to auscultation bilaterally  Heart: regular rate and rhythm, S1, S2 normal, no murmur, click, rub or gallop  Abdomen: Abdomen soft, non-tender. BS normal. No masses,  No organomegaly  GU: normal urethral meatus  Vulva: normal female genitalia  Vagina: suture line at posterior introitus of vagina is intact.  Questionable small separation (<1cm) of anterior vaginal wall suture line.    Assessment/Plan:      ICD-10-CM    1. Vaginal burning  N94.89       2. Dysuria  R30.0 URINE CULTURE      3. Vaginal pain  R10.20 VAGINITIS PATHOGENS: BACTERIAL, CANDIDA, AND TV PANEL, TMA          After consultation with Dr. Peterson, will prescribe Estrace cream to help with healing of vaginal tissues.  Vaginitis pathogens panel ordered.  Urine culture ordered.  Will start the patient on Duricef 500mg  BID for 7 days pending results of urine culture.    Follow-up: 3 days for re-check    Cheyla Duchemin Claudene, APRN, CNP  [facility]ne[facility]         [1]   Allergies  Allergen Reactions    Nabumetone Rash     itching   itching   itching   itching  itching   itching    itching    Zyrtec [Cetirizine]      Rash and Blisters    Augmentin [Amoxicillin-Pot Clavulanate]     Macrobid [Nitrofurantoin Monohyd/M-Cryst]      Severe cramping     Tramadol  Other Adverse Reaction (Add comment)     headaches   headaches   headaches   headaches    headaches   headaches    headaches    Trazodone     Tylenol -Codeine #4 [Acetaminophen -Codeine]      Dizzy     Erythromycin Diarrhea and  Other Adverse Reaction (Add comment)     cramps   Other reaction(s): Muscle Pain, Other (See Comments)   cramps   cramps    Other reaction(s): Muscle Pain, Other (See Comments)   cramps   cramps    cramps    Gabapentin  Other Adverse Reaction (Add comment) and Nausea/ Vomiting     Increased tremors   tremors   Other reaction(s): Other (See Comments)   Increased tremors   tremors   tremors    Other reaction(s): Other (See Comments)   Increased tremors   tremors   tremors    tremors    Levofloxacin Nausea/ Vomiting     Other reaction(s): Other (See Comments)   GI pain.   GI pain.    Other reaction(s): Other (See Comments)   GI pain.    GI pain.    Nitrofurantoin Diarrhea and Nausea/ Vomiting    Oseltamivir Diarrhea and Nausea/ Vomiting     Cramps and stomach issues   Other  reaction(s): Other (See Comments)   Cramps and stomach issues    Other reaction(s): Other (See Comments)   Cramps and stomach issues    Cramps and stomach issues    Sulfamethoxazole-Trimethoprim Nausea/ Vomiting

## 2023-11-15 ENCOUNTER — Ambulatory Visit (INDEPENDENT_AMBULATORY_CARE_PROVIDER_SITE_OTHER): Payer: Self-pay | Admitting: NURSE PRACTITIONER, FAMILY

## 2023-11-15 ENCOUNTER — Encounter (INDEPENDENT_AMBULATORY_CARE_PROVIDER_SITE_OTHER): Payer: Self-pay

## 2023-11-15 ENCOUNTER — Other Ambulatory Visit: Payer: Self-pay

## 2023-11-15 ENCOUNTER — Encounter (INDEPENDENT_AMBULATORY_CARE_PROVIDER_SITE_OTHER): Payer: Self-pay | Admitting: NURSE PRACTITIONER, FAMILY

## 2023-11-15 VITALS — BP 118/70 | Wt 144.2 lb

## 2023-11-15 DIAGNOSIS — R102 Pelvic and perineal pain unspecified side: Secondary | ICD-10-CM

## 2023-11-15 LAB — URINE CULTURE: URINE CULTURE: 20000 — AB

## 2023-11-15 MED ORDER — ESTRADIOL 0.01% (0.1 MG/GRAM) VAGINAL CREAM
TOPICAL_CREAM | VAGINAL | 0 refills | Status: DC
Start: 2023-11-15 — End: 2023-12-01

## 2023-11-15 NOTE — Progress Notes (Signed)
 Department of Obstetrics and Gynecology    HPI:   This is a 68 y.o. female who presents for follow-up for vaginal pain following TOT sling/posterior colporrhaphy/perineorrhaphy on 10/26/2023.    Current Therapy:  Estrace cream once daily  Effectiveness of Therapy:  The patient indicates the cream does not seem to be helping.  She did note that she is currently using an expired prescription as her mail order pharmacy has not yet sent her new prescription to her.  New or Worsening Symptoms:  She denies any new or worsening symptoms.    Recent Testing/Results:  Recent urine culture was positive for infection.  The patient was already treated for this infection with Duricef.  She reports that she is no longer having symptoms of dysuria.    ROS:  Constitutional: negative  Respiratory: negative  Cardiovascular: negative  Gastrointestinal: negative  Genitourinary:positive for vaginal pain  Neurological: negative  Behavioral/Psych: negative  Endocrine: negative    Past Medical History:  All were reviewed and updated in the EMR.  Past Surgical History:   Procedure Laterality Date    ABDOMINAL SACROCOLPOPEXY N/A 04/22/2021    at [facility]    EYE SURGERY      not sure which one    HX HYSTERECTOMY  04/22/2021    HX TLHBSO Bilateral 04/22/2021    TLH/BSO    VARICOSE VEIN SURGERY            Past Medical History:   Diagnosis Date    Allergic rhinitis     Back problem     Depression     Disorder of thyroid      Diverticulitis of colon     Dyspnea on exertion     Essential hypertension     Fibromyalgia     Hypothyroid     Irritable bowel syndrome     Neck problem     Osteoarthritis     Peripheral neuropathy     Pulmonary emphysema     slight    TMJ (dislocation of temporomandibular joint)     Tremors of nervous system     Vaginal infection     recurrent bv    Varicosities of leg          Current Outpatient Medications   Medication Sig    acetaminophen -codeine (TYLENOL  #3) 300-30 mg Oral Tablet Take 1 Tablet by mouth Every night     amLODIPine (NORVASC) 5 mg Oral Tablet Take 0.5 Tablets (2.5 mg total) by mouth Daily    B1-B3-B6-E-calcium-mag-chaste (AZO HORMONAL HEALTH CYCLE CARE) 100 mg-50 mg- 50 mg-15 mg Oral Tablet Take by mouth    cal/vit B12/folic acid/vit B6 (CALCIUM-VITAMINS B6-B12-FA ORAL) Take by mouth    cefadroxil (DURICEF) 500 mg Oral Capsule Take 1 Capsule (500 mg total) by mouth Twice daily for 7 days    cholecalciferol, vitamin D3, 25 mcg (1,000 unit) Oral Tablet Take 1 Tablet (1,000 Units total) by mouth    docusate sodium  (COLACE) 100 mg Oral Capsule Take 1 Capsule (100 mg total) by mouth Twice per day as needed for Constipation    DULoxetine (CYMBALTA DR) 60 mg Oral Capsule, Delayed Release(E.C.) Take 1 Capsule (60 mg total) by mouth Every night    estradioL (ESTRACE) 0.01 % (0.1 mg/gram) Vaginal Cream Apply a pea sized amount on area of tenderness daily.    famotidine (PEPCID) 20 mg Oral Tablet Take 1 Tablet (20 mg total) by mouth Every night    levothyroxine (SYNTHROID) 50 mcg Oral Tablet  magnesium citrate (CITROMA) Oral Solution Take 296 mL by mouth One time    magnesium oxide (MAG-OX) 400 mg Oral Tablet Take 1 Tablet (400 mg total) by mouth    methocarbamoL (ROBAXIN) 500 mg Oral Tablet Take 1 Tablet (500 mg total) by mouth Every night    multivitamin Oral Tablet Take 1 Tablet by mouth    potassium chloride (K-DUR) 10 mEq Oral Tab Sust.Rel. Particle/Crystal Take 1 Tablet (10 mEq total) by mouth Daily    pregabalin (LYRICA) 100 mg Oral Capsule TAKE 1 CAPSULE BY MOUTH EVERY MORNING AND 1 CAPSULE EVERY NIGHT    pregabalin (LYRICA) 50 mg Oral Capsule Take 3 Capsules (150 mg total) by mouth Every night    propranoloL  (INDERAL ) 40 mg Oral Tablet Take 1 Tablet (40 mg total) by mouth Twice daily    psyllium (METAMUCIL) Packet Take 1 Packet by mouth Twice daily    triamcinolone acetonide 0.1 % Ointment APPLY TWICE DAILY TO AFFECTED AREA FOR TWO WEEKS, TAKE A TWO WEEK BREAK AND REPEAT AS NEEDED.    vitamin E 400 unit Oral Capsule  Take 1 Capsule (400 Units total) by mouth Daily    zinc Sulfate (ZINCATE) 50 mg zinc (220 mg) Oral Tablet Take 1 Tablet (50 mg total) by mouth Daily    zolpidem (AMBIEN) 10 mg Oral Tablet Take 1 Tablet (10 mg total) by mouth Daily     Allergies[1]    Physical Examination:  Vitals:    11/15/23 1103   BP: 118/70   Weight: 65.4 kg (144 lb 2.9 oz)         Body mass index is 23.99 kg/m.    General: Normal  Lungs: clear to auscultation bilaterally  Heart: regular rate and rhythm, S1, S2 normal, no murmur, click, rub or gallop  Abdomen:  Soft, nontender, normoactive bowel sounds  GU: normal urethral meatus  Vulva: normal female genitalia  Vagina:  There is no change from last exam.    Assessment/Plan:      ICD-10-CM    1. Vaginal pain  R10.20           Will send the prescription for Estrace cream to her local pharmacy so that she can obtain this today.  Advised the patient to stop using the expired medication and begin using the new prescription.  Discussed warning signs to report immediately.    Encouraged the patient to keep her follow-up appointment scheduled for November 30, 2023 with Leonor Lee, PA-C.      Follow-up:  As needed with new or worsening symptoms    Ryot Burrous Claudene, APRN, FNP-C  [place] [facility] of Obstetrics, Gynecology, and Reproductive Sciences         [1]   Allergies  Allergen Reactions    Nabumetone Rash     itching   itching   itching   itching    itching   itching    itching    Zyrtec [Cetirizine]      Rash and Blisters    Augmentin [Amoxicillin-Pot Clavulanate]     Macrobid [Nitrofurantoin Monohyd/M-Cryst]      Severe cramping     Tramadol  Other Adverse Reaction (Add comment)     headaches   headaches   headaches   headaches    headaches   headaches    headaches    Trazodone     Tylenol -Codeine #4 [Acetaminophen -Codeine]      Dizzy     Erythromycin Diarrhea and  Other Adverse Reaction (Add comment)  cramps   Other reaction(s): Muscle Pain, Other (See Comments)   cramps   cramps    Other reaction(s):  Muscle Pain, Other (See Comments)   cramps   cramps    cramps    Gabapentin  Other Adverse Reaction (Add comment) and Nausea/ Vomiting     Increased tremors   tremors   Other reaction(s): Other (See Comments)   Increased tremors   tremors   tremors    Other reaction(s): Other (See Comments)   Increased tremors   tremors   tremors    tremors    Levofloxacin Nausea/ Vomiting     Other reaction(s): Other (See Comments)   GI pain.   GI pain.    Other reaction(s): Other (See Comments)   GI pain.    GI pain.    Nitrofurantoin Diarrhea and Nausea/ Vomiting    Oseltamivir Diarrhea and Nausea/ Vomiting     Cramps and stomach issues   Other reaction(s): Other (See Comments)   Cramps and stomach issues    Other reaction(s): Other (See Comments)   Cramps and stomach issues    Cramps and stomach issues    Sulfamethoxazole-Trimethoprim Nausea/ Vomiting

## 2023-11-15 NOTE — Telephone Encounter (Addendum)
 Pt notified of new rx sent to her pharmacy via My Chart

## 2023-11-15 NOTE — Telephone Encounter (Signed)
-----   Message from Katheryn GAILS, [place] sent at 11/15/2023 12:16 PM EDT -----  Regarding: FW: Clinical Question  ----- Message from Vina HERO sent at 11/15/2023 12:11 PM EDT -----  Copied From CRM #[id number].  Vanessa [name] (Self) called with a clinical question.       [name] Pt  Pt states [name] had wanted her to call back to let her know about her cream script from last week.  Pt states the mail order pharmacy has not shipped so she cancelled the order and would like [name] to send a script to [facility].    estradioL (ESTRACE) 0.01 % (0.1 mg/gram) Vaginal Cream    Preferred Pharmacy     [facility] - [place] - [address]    [address] [place] [address]    Phone: [phone] Fax: [phone]    Hours: Not open 24 hours     Thank You  Paula McMillen

## 2023-11-28 ENCOUNTER — Other Ambulatory Visit (HOSPITAL_COMMUNITY): Payer: Self-pay | Admitting: NURSE PRACTITIONER

## 2023-11-28 DIAGNOSIS — Z122 Encounter for screening for malignant neoplasm of respiratory organs: Secondary | ICD-10-CM

## 2023-11-28 DIAGNOSIS — F17210 Nicotine dependence, cigarettes, uncomplicated: Secondary | ICD-10-CM

## 2023-11-30 ENCOUNTER — Encounter (INDEPENDENT_AMBULATORY_CARE_PROVIDER_SITE_OTHER): Payer: Self-pay | Admitting: PHYSICIAN ASSISTANT

## 2023-11-30 ENCOUNTER — Ambulatory Visit (INDEPENDENT_AMBULATORY_CARE_PROVIDER_SITE_OTHER): Payer: Self-pay | Admitting: PHYSICIAN ASSISTANT

## 2023-11-30 ENCOUNTER — Other Ambulatory Visit: Payer: Self-pay

## 2023-11-30 ENCOUNTER — Other Ambulatory Visit: Attending: PHYSICIAN ASSISTANT | Admitting: PHYSICIAN ASSISTANT

## 2023-11-30 VITALS — BP 122/74 | Ht 63.0 in | Wt 144.6 lb

## 2023-11-30 DIAGNOSIS — Z13820 Encounter for screening for osteoporosis: Secondary | ICD-10-CM

## 2023-11-30 DIAGNOSIS — Z4889 Encounter for other specified surgical aftercare: Secondary | ICD-10-CM

## 2023-11-30 DIAGNOSIS — Z1239 Encounter for other screening for malignant neoplasm of breast: Secondary | ICD-10-CM

## 2023-11-30 DIAGNOSIS — R399 Unspecified symptoms and signs involving the genitourinary system: Secondary | ICD-10-CM

## 2023-12-01 ENCOUNTER — Encounter (INDEPENDENT_AMBULATORY_CARE_PROVIDER_SITE_OTHER): Payer: Self-pay | Admitting: PHYSICIAN ASSISTANT

## 2023-12-01 MED ORDER — ESTRADIOL 0.01% (0.1 MG/GRAM) VAGINAL CREAM
TOPICAL_CREAM | VAGINAL | 0 refills | Status: AC
Start: 2023-12-01 — End: ?

## 2023-12-01 NOTE — Progress Notes (Signed)
 Postop visit    Vanessa [name] is a 68 y/o female S/P uncomplicated TOT sling/posterior colporrhaphy/perineorrhaphy on 10/26/2023.     Path   N/A    Patient states that she is doing well. No complaints. No vaginal bleeding or significant postoperative pain. Reports significant improvement with use of vaginal Estrace. Reports possible UTI.     ROS:   Denies any fevers or chills, bladder or bowel complaints. All other systems are negative to review.    Physical Examination:  Vitals:    11/30/23 1042   BP: 122/74   Weight: 65.6 kg (144 lb 10 oz)   Height: 1.6 m (5' 3)   BMI: 25.62     General: Pleasant female in NAD  Skin: intact, without lesions  Incision: clean and dry and intact without evidence of erythema or induration  Neurologic: Alert and oriented  Psychiatric: Mood stable    Gynecologic:    Vulva: Without lesions   Vagina: Intact site of TOT sling/POP repair    Assessment/plan:      ICD-10-CM    1. Postoperative visit  Z48.89       2. UTI symptoms  R39.9 URINE CULTURE      3. Screening for osteoporosis  Z13.820 DEXA BONE DENSITOMETRY      4. Encounter for screening for malignant neoplasm of breast, unspecified screening modality  Z12.39 MAMMO BILATERAL SCREENING-ADDL VIEWS/BREAST US  AS REQ BY RAD         Orders Placed This Encounter    URINE CULTURE    DEXA BONE DENSITOMETRY    MAMMO BILATERAL SCREENING-ADDL VIEWS/BREAST US  AS REQ BY RAD     1.  Postoperative visit    The patient is doing well postoperatively. RTO for routine gynecologic examination. Postoperative clearances discussed. DEXA due now, mammogram due January 2026. Orders placed. Urine culture collected, will treat as appropriate. Continue vaginal Estrace 2-3 times weekly.     Leonor Lee, PA-C  [facility] Department of Obstetrics and Gynecology        I reviewed the APP's note.  I agree with the findings and plan of care as documented in the APP's note.  Any exceptions/additions are edited/noted.    Jerilynn Finder, MD

## 2023-12-02 ENCOUNTER — Other Ambulatory Visit (INDEPENDENT_AMBULATORY_CARE_PROVIDER_SITE_OTHER): Payer: Self-pay | Admitting: PHYSICIAN ASSISTANT

## 2023-12-02 ENCOUNTER — Ambulatory Visit (INDEPENDENT_AMBULATORY_CARE_PROVIDER_SITE_OTHER): Payer: Self-pay | Admitting: PHYSICIAN ASSISTANT

## 2023-12-02 LAB — URINE CULTURE: URINE CULTURE: >100000 — AB

## 2023-12-02 MED ORDER — NITROFURANTOIN MONOHYDRATE/MACROCRYSTALS 100 MG CAPSULE
100.0000 mg | ORAL_CAPSULE | Freq: Two times a day (BID) | ORAL | 0 refills | Status: DC
Start: 2023-12-02 — End: 2023-12-09

## 2023-12-08 ENCOUNTER — Other Ambulatory Visit (INDEPENDENT_AMBULATORY_CARE_PROVIDER_SITE_OTHER): Payer: Self-pay | Admitting: PHYSICIAN ASSISTANT

## 2023-12-08 ENCOUNTER — Ambulatory Visit (INDEPENDENT_AMBULATORY_CARE_PROVIDER_SITE_OTHER): Payer: Self-pay | Admitting: PHYSICIAN ASSISTANT

## 2023-12-08 MED ORDER — CEPHALEXIN 500 MG CAPSULE
500.0000 mg | ORAL_CAPSULE | Freq: Two times a day (BID) | ORAL | 0 refills | Status: AC
Start: 2023-12-08 — End: 2023-12-22

## 2023-12-08 NOTE — Telephone Encounter (Signed)
-----   Message from Leonor Lee, [place] sent at 12/08/2023  8:50 AM EDT -----  Regarding: RE: Clinical Question  ----- Message from [place] ORN sent at 12/07/2023 12:17 PM EDT -----  Copied From CRM [id number].  Dorrie Orie CROME (Self) called with a clinical question.   Baker Pt     Pt states she had labs done on 10/22. States the following medication was sent to her pharmacy and that she is allergic. States she is needing a new script sent over. States the only thing that works is Cefaphoxin. Please advise.    [facility] - [place], [place] - [address]   [address] [place] [address]   Phone: [phone] Fax: [phone]   Hours: Not open 24 hours        Thank you,  Randa

## 2023-12-08 NOTE — Nursing Note (Signed)
 Lennie Best, PA-C sent to Steffan Mar, RN  I assume she means Keflex , sent to pharmacy    Pt notified of new rx at her pharmacy   Mar Steffan, RN  12/08/2023 11:10

## 2024-01-03 ENCOUNTER — Encounter (HOSPITAL_COMMUNITY): Payer: Self-pay

## 2024-01-03 ENCOUNTER — Emergency Department (HOSPITAL_COMMUNITY)

## 2024-01-03 ENCOUNTER — Emergency Department
Admission: EM | Admit: 2024-01-03 | Discharge: 2024-01-03 | Disposition: A | Discharge status: Home / Self Care | Source: Ambulatory Visit | Attending: Emergency Medicine | Admitting: Emergency Medicine

## 2024-01-03 ENCOUNTER — Other Ambulatory Visit: Payer: Self-pay

## 2024-01-03 DIAGNOSIS — R112 Nausea with vomiting, unspecified: Secondary | ICD-10-CM

## 2024-01-03 DIAGNOSIS — R918 Other nonspecific abnormal finding of lung field: Secondary | ICD-10-CM | POA: Insufficient documentation

## 2024-01-03 DIAGNOSIS — R0602 Shortness of breath: Secondary | ICD-10-CM

## 2024-01-03 DIAGNOSIS — J9809 Other diseases of bronchus, not elsewhere classified: Secondary | ICD-10-CM

## 2024-01-03 DIAGNOSIS — R9431 Abnormal electrocardiogram [ECG] [EKG]: Secondary | ICD-10-CM | POA: Insufficient documentation

## 2024-01-03 DIAGNOSIS — R519 Headache, unspecified: Secondary | ICD-10-CM | POA: Insufficient documentation

## 2024-01-03 DIAGNOSIS — J8489 Other specified interstitial pulmonary diseases: Secondary | ICD-10-CM

## 2024-01-03 LAB — COMPREHENSIVE METABOLIC PANEL, NON-FASTING
ALBUMIN: 3.9 g/dL (ref 3.4–4.8)
ALKALINE PHOSPHATASE: 121 U/L (ref 55–145)
ALT (SGPT): 15 U/L (ref <31)
ANION GAP: 8 mmol/L (ref 4–13)
AST (SGOT): 27 U/L (ref 11–34)
BILIRUBIN TOTAL: 0.4 mg/dL (ref 0.3–1.3)
BUN/CREA RATIO: 20 (ref 6–22)
BUN: 18 mg/dL (ref 8–25)
CALCIUM: 9.5 mg/dL (ref 8.6–10.3)
CHLORIDE: 101 mmol/L (ref 96–111)
CO2 TOTAL: 27 mmol/L (ref 23–31)
CREATININE: 0.89 mg/dL (ref 0.60–1.05)
GLUCOSE: 100 mg/dL (ref 65–125)
POTASSIUM: 3.6 mmol/L (ref 3.5–5.1)
PROTEIN TOTAL: 7.7 g/dL (ref 6.0–8.0)
SODIUM: 136 mmol/L (ref 136–145)
eGFRcr - FEMALE: 71 mL/min/1.73mˆ2 (ref >=60)

## 2024-01-03 LAB — CBC WITH DIFF
BASOPHIL #: <0.1 x10ˆ3/uL (ref <=0.20)
BASOPHIL %: 0.9 %
EOSINOPHIL #: 0.21 x10ˆ3/uL (ref <=0.50)
EOSINOPHIL %: 2.7 %
HCT: 35.6 % (ref 34.8–46.0)
HGB: 12.7 g/dL (ref 11.5–16.0)
IMMATURE GRANULOCYTE #: <0.1 x10ˆ3/uL (ref <0.10)
IMMATURE GRANULOCYTE %: 0.3 % (ref 0.0–1.0)
LYMPHOCYTE #: 1.82 x10ˆ3/uL (ref 1.00–4.80)
LYMPHOCYTE %: 23.8 %
MCH: 33.3 pg — ABNORMAL HIGH (ref 26.0–32.0)
MCHC: 35.7 g/dL — ABNORMAL HIGH (ref 31.0–35.5)
MCV: 93.4 fL (ref 78.0–100.0)
MONOCYTE #: 0.46 x10ˆ3/uL (ref 0.20–1.10)
MONOCYTE %: 6 %
MPV: 10.1 fL (ref 8.7–12.5)
NEUTROPHIL #: 5.06 x10ˆ3/uL (ref 1.50–7.70)
NEUTROPHIL %: 66.3 %
PLATELETS: 233 x10ˆ3/uL (ref 150–400)
RBC: 3.81 x10ˆ6/uL — ABNORMAL LOW (ref 3.85–5.22)
RDW-CV: 13.1 % (ref 11.5–15.5)
WBC: 7.6 x10ˆ3/uL (ref 3.7–11.0)

## 2024-01-03 LAB — ECG 12 LEAD
Atrial Rate: 65 {beats}/min
Calculated P Axis: 59 degrees
Calculated R Axis: 44 degrees
Calculated T Axis: 5 degrees
PR Interval: 180 ms
QRS Duration: 90 ms
QT Interval: 454 ms
QTC Calculation: 472 ms
Ventricular rate: 65 {beats}/min

## 2024-01-03 LAB — CREATINE KINASE (CK), TOTAL, SERUM OR PLASMA: CREATINE KINASE: 53 U/L (ref 25–190)

## 2024-01-03 LAB — MAGNESIUM: MAGNESIUM: 1.9 mg/dL (ref 1.8–2.6)

## 2024-01-03 LAB — TROPONIN-I: TROPONIN-I HS: 2.8 ng/L (ref <=14.0)

## 2024-01-03 LAB — B-TYPE NATRIURETIC PEPTIDE (BNP),PLASMA: NT-PROBNP: 573.8 pg/mL — ABNORMAL HIGH (ref <300.0)

## 2024-01-03 LAB — SEDIMENTATION RATE: ERYTHROCYTE SEDIMENTATION RATE (ESR): 24 mm/h — ABNORMAL HIGH (ref 0–20)

## 2024-01-03 MED ORDER — DEXAMETHASONE SODIUM PHOSPHATE 4 MG/ML INJECTION SOLUTION
10.0000 mg | INTRAMUSCULAR | Status: AC
Start: 2024-01-03 — End: 2024-01-03
  Administered 2024-01-03: 10 mg via INTRAVENOUS
  Filled 2024-01-03: qty 3

## 2024-01-03 MED ORDER — DIPHENHYDRAMINE 50 MG/ML INJECTION SOLUTION
25.0000 mg | INTRAMUSCULAR | Status: AC
Start: 2024-01-03 — End: 2024-01-03
  Administered 2024-01-03: 25 mg via INTRAVENOUS
  Filled 2024-01-03: qty 1

## 2024-01-03 MED ORDER — KETOROLAC 30 MG/ML (1 ML) INJECTION SOLUTION
30.0000 mg | INTRAMUSCULAR | Status: AC
Start: 2024-01-03 — End: 2024-01-03
  Administered 2024-01-03: 30 mg via INTRAVENOUS
  Filled 2024-01-03: qty 1

## 2024-01-03 MED ORDER — PROCHLORPERAZINE EDISYLATE 10 MG/2 ML (5 MG/ML) INJECTION SOLUTION
10.0000 mg | INTRAMUSCULAR | Status: AC
Start: 2024-01-03 — End: 2024-01-03
  Administered 2024-01-03: 10 mg via INTRAVENOUS
  Filled 2024-01-03: qty 2

## 2024-01-03 MED ORDER — MAGNESIUM SULFATE 2 GRAM/50 ML (4 %) IN WATER INTRAVENOUS PIGGYBACK
2.0000 g | INJECTION | INTRAVENOUS | Status: DC
Start: 2024-01-03 — End: 2024-01-03

## 2024-01-03 MED ORDER — DIPHENHYDRAMINE 50 MG/ML INJECTION SOLUTION
12.5000 mg | INTRAMUSCULAR | Status: DC
Start: 2024-01-03 — End: 2024-01-03

## 2024-01-03 MED ORDER — ONDANSETRON 4 MG DISINTEGRATING TABLET
4.0000 mg | ORAL_TABLET | Freq: Three times a day (TID) | ORAL | 0 refills | Status: AC | PRN
Start: 2024-01-03 — End: ?

## 2024-01-03 MED ORDER — SODIUM CHLORIDE 0.9 % IV BOLUS
1000.0000 mL | INJECTION | Status: AC
Start: 2024-01-03 — End: 2024-01-03
  Administered 2024-01-03: 0 mL via INTRAVENOUS
  Administered 2024-01-03: 1000 mL via INTRAVENOUS

## 2024-01-03 MED ORDER — HYDROMORPHONE 1 MG/ML INJECTION SYRINGE
0.6000 mg | INJECTION | INTRAMUSCULAR | Status: AC
Start: 2024-01-03 — End: 2024-01-03
  Administered 2024-01-03: 0.6 mg via INTRAVENOUS
  Filled 2024-01-03: qty 1

## 2024-01-03 MED ORDER — OXYCODONE-ACETAMINOPHEN 5 MG-325 MG TABLET
1.0000 | ORAL_TABLET | Freq: Three times a day (TID) | ORAL | 0 refills | Status: AC | PRN
Start: 2024-01-03 — End: ?

## 2024-01-03 NOTE — ED Provider Notes (Signed)
 [facility] - Emergency Department      Attending Physician: Dr. Lennice Meter  CC:  Chief Complaint   Patient presents with    Headache     Headache/facial.eye pain x 3 days   Pt saw eye doctor this morning -- sent to PCP -- PCP sent to ER      Patient seen immediately on arrival to the emergency room.  HPI:  Vanessa [name] is a 68 y.o. female who presents to the Emergency Room with c/o migraine headache for last 3 days since Sunday.  She said she is nauseated and she vomited last night.  She said light bothers her eyes.  She is complaining headache and more on right side of the head done in his left side.  She has no weakness in his upper or lower extremities.  The no fever or chills no chest pain or shortness of breath no abdominal pain.  She said she saw the eye doctor this morning and told that everything is okay from his point of view.    Review of Systems:  Constitutional:  Constitutional symptoms negative except what describe in history.  Skin:  Negative except what Idescribed in his history.  HENT:Negative except what I described in his history.  Eyes: Negative except what a described in his history.  Cardio: Negative except what I described in his history.  Respiratory:Negative except what I described in his history.  GI: Negative except what I a described in his history.  GU: Negative except what I described in his history.  MSK: Negative except what I described in his history.  Neuro: Negative except what I described in his history.   All other systems reviewed and are negative or as noted in HPI.    History:  PMH:    Past Medical History:   Diagnosis Date    Allergic rhinitis     Back problem     Depression     Disorder of thyroid      Diverticulitis of colon     Dyspnea on exertion     Essential hypertension     Fibromyalgia     Hypothyroid     Irritable bowel syndrome     Neck problem     Osteoarthritis     Peripheral neuropathy     Pulmonary emphysema     slight    TMJ (dislocation of temporomandibular  joint)     Tremors of nervous system     Vaginal infection     recurrent bv    Varicosities of leg          Previous Medications    ACETAMINOPHEN -CODEINE (TYLENOL  #3) 300-30 MG ORAL TABLET    Take 1 Tablet by mouth Every night    AMLODIPINE (NORVASC) 5 MG ORAL TABLET    Take 0.5 Tablets (2.5 mg total) by mouth Daily    B1-B3-B6-E-CALCIUM-MAG-CHASTE (AZO HORMONAL HEALTH CYCLE CARE) 100 MG-50 MG- 50 MG-15 MG ORAL TABLET    Take by mouth    CAL/VIT B12/FOLIC ACID/VIT B6 (CALCIUM-VITAMINS B6-B12-FA ORAL)    Take by mouth    CHOLECALCIFEROL, VITAMIN D3, 25 MCG (1,000 UNIT) ORAL TABLET    Take 1 Tablet (1,000 Units total) by mouth    DOCUSATE SODIUM  (COLACE) 100 MG ORAL CAPSULE    Take 1 Capsule (100 mg total) by mouth Twice per day as needed for Constipation    DULOXETINE (CYMBALTA DR) 60 MG ORAL CAPSULE, DELAYED RELEASE(E.C.)    Take 1 Capsule (60 mg  total) by mouth Every night    ESTRADIOL  (ESTRACE ) 0.01 % (0.1 MG/GRAM) VAGINAL CREAM    Use a pea sized amount on the fingertip vaginally 2-3 times weekly. Do not use applicator.    FAMOTIDINE (PEPCID) 20 MG ORAL TABLET    Take 1 Tablet (20 mg total) by mouth Every night    LEVOTHYROXINE (SYNTHROID) 50 MCG ORAL TABLET        MAGNESIUM  CITRATE (CITROMA) ORAL SOLUTION    Take 296 mL by mouth One time    MAGNESIUM  OXIDE (MAG-OX) 400 MG ORAL TABLET    Take 1 Tablet (400 mg total) by mouth    METHOCARBAMOL (ROBAXIN) 500 MG ORAL TABLET    Take 1 Tablet (500 mg total) by mouth Every night    MULTIVITAMIN ORAL TABLET    Take 1 Tablet by mouth    POTASSIUM CHLORIDE (K-DUR) 10 MEQ ORAL TAB SUST.REL. PARTICLE/CRYSTAL    Take 1 Tablet (10 mEq total) by mouth Daily    PREGABALIN (LYRICA) 100 MG ORAL CAPSULE    TAKE 1 CAPSULE BY MOUTH EVERY MORNING AND 1 CAPSULE EVERY NIGHT    PREGABALIN (LYRICA) 50 MG ORAL CAPSULE    Take 3 Capsules (150 mg total) by mouth Every night    PROPRANOLOL  (INDERAL ) 40 MG ORAL TABLET    Take 1 Tablet (40 mg total) by mouth Twice daily    PSYLLIUM (METAMUCIL)  PACKET    Take 1 Packet by mouth Twice daily    TRIAMCINOLONE ACETONIDE 0.1 % OINTMENT    APPLY TWICE DAILY TO AFFECTED AREA FOR TWO WEEKS, TAKE A TWO WEEK BREAK AND REPEAT AS NEEDED.    VITAMIN E 400 UNIT ORAL CAPSULE    Take 1 Capsule (400 Units total) by mouth Daily    ZINC SULFATE (ZINCATE) 50 MG ZINC (220 MG) ORAL TABLET    Take 1 Tablet (50 mg total) by mouth Daily    ZOLPIDEM (AMBIEN) 10 MG ORAL TABLET    Take 1 Tablet (10 mg total) by mouth Daily       PSH:    Past Surgical History:   Procedure Laterality Date    ABDOMINAL SACROCOLPOPEXY N/A 04/22/2021    at [facility]    EYE SURGERY      not sure which one    HX HYSTERECTOMY  04/22/2021    HX TLHBSO Bilateral 04/22/2021    TLH/BSO    VARICOSE VEIN SURGERY           Social Hx:    Social History     Socioeconomic History    Marital status: Single     Spouse name: Not on file    Number of children: Not on file    Years of education: Not on file    Highest education level: Not on file   Occupational History    Not on file   Tobacco Use    Smoking status: Every Day     Current packs/day: 1.00     Average packs/day: 1 pack/day for 49.9 years (49.9 ttl pk-yrs)     Types: Cigarettes     Start date: 1976    Smokeless tobacco: Never   Vaping Use    Vaping status: Never Used   Substance and Sexual Activity    Alcohol use: Not Currently    Drug use: Never    Sexual activity: Not Currently     Birth control/protection: Surgical   Other Topics Concern    Ability to Walk  1 Flight of Steps without SOB/CP Yes    Routine Exercise No    Ability to Walk 2 Flight of Steps without SOB/CP No    Unable to Ambulate No    Total Care No    Ability To Do Own ADL's Yes    Uses Walker No    Other Activity Level No    Uses Cane No   Social History Narrative    Not on file     Social Determinants of Health     Financial Resource Strain: Low Risk  (10/27/2020)    Received from [facility]    Overall Financial Resource Strain (CARDIA)     Difficulty of Paying Living Expenses: Not hard at  all   Transportation Needs: No Transportation Needs (10/27/2020)    Received from [facility] - Transportation     Lack of Transportation (Medical): No     Lack of Transportation (Non-Medical): No   Social Connections: Socially Isolated (09/27/2018)    Received from [facility]    Social Connection and Isolation Panel     In a typical week, how many times do you talk on the phone with family, friends, or neighbors?: More than three times a week     How often do you get together with friends or relatives?: Never     How often do you attend church or religious services?: Never     Do you belong to any clubs or organizations such as church groups, unions, fraternal or athletic groups, or school groups?: No     How often do you attend meetings of the clubs or organizations you belong to?: Never     Are you married, widowed, divorced, separated, never married, or living with a partner?: Divorced   Intimate Partner Violence: Not At Risk (09/27/2018)    Received from [facility]    Humiliation, Afraid, Rape, and Kick questionnaire     Within the last year, have you been afraid of your partner or ex-partner?: No     Within the last year, have you been humiliated or emotionally abused in other ways by your partner or ex-partner?: No     Within the last year, have you been kicked, hit, slapped, or otherwise physically hurt by your partner or ex-partner?: No     Within the last year, have you been raped or forced to have any kind of sexual activity by your partner or ex-partner?: No   Housing Stability: Not on file     Family Hx:   Family History   Problem Relation Age of Onset    Osteoporosis Mother     No Known Problems Father     No Known Problems Sister     No Known Problems Brother     Breast Cancer Maternal Grandmother     No Known Problems Maternal Grandfather     No Known Problems Paternal Grandmother     No Known Problems Paternal Grandfather     No Known Problems Daughter     No Known Problems Son     Breast  Cancer Maternal Aunt     No Known Problems Maternal Uncle     No Known Problems Paternal Aunt     No Known Problems Paternal Uncle     Uterine Fibroids Other     Cervical Cancer Neg Hx     Clotting Disorder Neg Hx     Colon Cancer Neg Hx     Diabetes  Neg Hx     Heart Disease Neg Hx     Hypertension (High Blood Pressure) Neg Hx     Rectal Cancer Neg Hx     Uterine Cancer Neg Hx     Ovarian Cancer Neg Hx     Pancreatic Cancer Neg Hx      Allergies: Allergies[1]    Above history reviewed with patient, changes are as documented.    Physical Exam:   Nursing note and vitals reviewed.  ED Triage Vitals   BP (Non-Invasive) 01/03/24 1624 (!) 145/99   Heart Rate 01/03/24 1624 74   Respiratory Rate 01/03/24 1624 20   Temperature 01/03/24 1624 36.5 C (97.7 F)   SpO2 01/03/24 1624 100 %   Weight 01/03/24 1641 68 kg (149 lb 14.6 oz)   Height 01/03/24 1624 1.6 m (5' 3)       Constitutional: Pt is alert and oriented and appears well-developed and well-nourished. No acute distress.   Head.  Atraumatic.  Pupils.  Equal and reacting to light.  HENT:   Mouth/Throat: Oropharynx is clear and moist.  Tonsils not enlarged no inflammation.  Eyes: Conjunctivae and extraocular motions are normal. Pupils are equal, round, and reactive to light.   Neck: Normal range of motion. Neck supple.  No JVDs or bruits trachea midline  Cardiovascular:  Heart S1-S2 regular no gallop or murmur.  Normal rate, regular rhythm and intact distal pulses.    Pulmonary/Chest: Effort normal. No respiratory distress. Breath sounds normal.  Lungs are clear no rales rhonchi or wheezing.  Abdominal: Abdomen is soft, nontender, nondistended.  No rebound or guarding noted.  Bowel sounds are present.  Liver spleen and kidneys are not palpable.  Musculoskeletal: Normal range of motion.  Examination of lower extremities shows no edema.  Neurological: Pt is alert and oriented. Grossly intact. Moving all extremities. Facies symmetric.  No motor or sensory deficits.  Skin:  Skin is warm and dry without rash.  Color normal.      Course:   Impression: Pt presenting c/o   Chief Complaint   Patient presents with    Headache     Headache/facial.eye pain x 3 days   Pt saw eye doctor this morning -- sent to PCP -- PCP sent to ER        Plan:Will obtain the following labs/imaging and give patient the following medications to alleviate symptoms:  Orders Placed This Encounter    CT BRAIN WO IV CONTRAST    XR AP MOBILE CHEST    RAINBOW DRAW - UTN ONLY    BLUE TOP TUBE    LIGHT GREEN TOP TUBE    LAVENDER TOP TUBE    RED TOP TUBE    CANCELED: NT-PROBNP    CANCELED: CBC/DIFF    CANCELED: COMPREHENSIVE METABOLIC PANEL, NON-FASTING    CANCELED: MAGNESIUM     CANCELED: CREATINE KINASE (CK), TOTAL, SERUM    CANCELED: TROPONIN-I    CANCELED: CBC WITH DIFF    CANCELED: SEDIMENTATION RATE    NT-PROBNP    MAGNESIUM     CREATINE KINASE (CK), TOTAL, SERUM    COMPREHENSIVE METABOLIC PANEL, NON-FASTING    CBC/DIFF    SEDIMENTATION RATE    TROPONIN-I    CBC WITH DIFF    ECG 12 LEAD    NS bolus infusion 1,000 mL    ketorolac  (TORADOL ) 30 mg/mL injection    dexAMETHasone  4 mg/mL injection    prochlorperazine  (COMPAZINE ) 5 mg/mL injection    diphenhydrAMINE  (BENADRYL ) 50 mg/mL  injection    HYDROmorphone  (DILAUDID ) 1 mg/mL injection    oxyCODONE -acetaminophen  (PERCOCET) 5-325 mg Oral Tablet    ondansetron  (ZOFRAN  ODT) 4 mg Oral Tablet, Rapid Dissolve     Laboratory Results:    Results for orders placed or performed during the hospital encounter of 01/03/24 (from the past 12 hours)   NT-PROBNP   Result Value Ref Range    NT-PROBNP 573.8 (H) <300.0 pg/mL   MAGNESIUM    Result Value Ref Range    MAGNESIUM  1.9 1.8 - 2.6 mg/dL   CREATINE KINASE (CK), TOTAL, SERUM   Result Value Ref Range    CREATINE KINASE 53 25 - 190 U/L   COMPREHENSIVE METABOLIC PANEL, NON-FASTING   Result Value Ref Range    SODIUM 136 136 - 145 mmol/L    POTASSIUM 3.6 3.5 - 5.1 mmol/L    CHLORIDE 101 96 - 111 mmol/L    CO2 TOTAL 27 23 - 31 mmol/L     ANION GAP 8 4 - 13 mmol/L    BUN 18 8 - 25 mg/dL    CREATININE 9.10 9.39 - 1.05 mg/dL    BUN/CREA RATIO 20 6 - 22    eGFRcr - FEMALE 71 >=60 mL/min/1.21m^2    ALBUMIN 3.9 3.4 - 4.8 g/dL    CALCIUM 9.5 8.6 - 89.6 mg/dL    GLUCOSE 899 65 - 874 mg/dL    ALKALINE PHOSPHATASE 121 55 - 145 U/L    ALT (SGPT) 15 <31 U/L    AST (SGOT)  27 11 - 34 U/L    BILIRUBIN TOTAL 0.4 0.3 - 1.3 mg/dL    PROTEIN TOTAL 7.7 6.0 - 8.0 g/dL   SEDIMENTATION RATE   Result Value Ref Range    ERYTHROCYTE SEDIMENTATION RATE (ESR) 24 (H) 0 - 20 mm/hr   TROPONIN-I   Result Value Ref Range    TROPONIN-I HS 2.8 <=14.0 ng/L   CBC WITH DIFF   Result Value Ref Range    WBC 7.6 3.7 - 11.0 x10^3/uL    RBC 3.81 (L) 3.85 - 5.22 x10^6/uL    HGB 12.7 11.5 - 16.0 g/dL    HCT 64.3 65.1 - 53.9 %    MCV 93.4 78.0 - 100.0 fL    MCH 33.3 (H) 26.0 - 32.0 pg    MCHC 35.7 (H) 31.0 - 35.5 g/dL    RDW-CV 86.8 88.4 - 84.4 %    PLATELETS 233 150 - 400 x10^3/uL    MPV 10.1 8.7 - 12.5 fL    NEUTROPHIL % 66.3 %    LYMPHOCYTE % 23.8 %    MONOCYTE % 6.0 %    EOSINOPHIL % 2.7 %    BASOPHIL % 0.9 %    NEUTROPHIL # 5.06 1.50 - 7.70 x10^3/uL    LYMPHOCYTE # 1.82 1.00 - 4.80 x10^3/uL    MONOCYTE # 0.46 0.20 - 1.10 x10^3/uL    EOSINOPHIL # 0.21 <=0.50 x10^3/uL    BASOPHIL # <0.10 <=0.20 x10^3/uL    IMMATURE GRANULOCYTE % 0.3 0.0 - 1.0 %    IMMATURE GRANULOCYTE # <0.10 <0.10 x10^3/uL        Radiographical Imaging:   Results for orders placed or performed during the hospital encounter of 01/03/24   XR AP MOBILE CHEST     Status: None    Narrative    ORIE CROME Whitby  Female, 68 years old.    XR AP MOBILE CHEST performed on 01/03/2024 5:17 PM.    REASON FOR EXAM:  Short of, h/a     TECHNIQUE: 1 views/1 images submitted for interpretation.    COMPARISON: Radiograph of the chest performed 10/07/2023.    FINDINGS:  The heart is mildly enlarged, unchanged. There is diffuse interstitial prominence. Right greater than left infrahilar reticular opacity is seen. Scattered peribronchial cuffing is  also noted. There is no large pleural effusion or pneumothorax. Included portions of the upper abdomen are unremarkable.      Impression    Diffuse interstitial prominence and right greater than left infrahilar reticular opacity, along with scattered peribronchial cuffing, concerning for an infectious process in the appropriate clinical setting.        Radiologist location ID: [id number]     CT BRAIN WO IV CONTRAST     Status: None    Narrative    ORIE CROME Bernasconi  Female, 68 years old.    CT BRAIN WO IV CONTRAST performed on 01/03/2024 5:40 PM    INDICATION: 68 years old Female Headache    TECHNIQUE: CT of the head acquired without contrast administration    RADIATION DOSE: 1181.10 mGy.cm    COMPARISON: None.    FINDINGS:     Brain: No acute territorial infarction or intraparenchymal hemorrhage. No mass effect. No midline shift. Cerebral volume is normal.    Ventricles/extra-axial spaces: No hydrocephalus. No extra-axial hemorrhage or fluid collection.    Other findings: No acute fracture.        Impression    No acute intracranial abnormality.            Radiologist location ID: [id number]         EKG-   Most Recent EKG This Encounter   ECG 12 LEAD    Collection Time: 01/03/24  5:03 PM   Result Value    Ventricular rate 65    Atrial Rate 65    PR Interval 180    QRS Duration 90    QT Interval 454    QTC Calculation 472    Calculated P Axis 59    Calculated R Axis 44    Calculated T Axis 5    Narrative    Normal sinus rhythm  ST & T wave abnormality, consider anterior ischemia  Prolonged QT  Abnormal ECG  When compared with ECG of 07-Oct-2023 11:49,  Criteria for Inferior infarct are no longer present  T wave inversion now evident in Anterior leads  Confirmed by Dominique, Lurlean Kernen ([id number]) on 01/03/2024 5:18:56 PM         All labs were reviewed. Medical Records reviewed.     MDM/Course:    Medical Decision Making  Amount and/or Complexity of Data Reviewed  Labs: ordered.  Radiology: ordered.  ECG/medicine tests:  ordered.    Risk  Prescription drug management.  Parenteral controlled substances.         Visit Vitals  Vitals:    01/03/24 1830 01/03/24 1845 01/03/24 1854 01/03/24 1900   BP: 128/76 127/72  125/76   Pulse: 77 75  80   Resp:  15 16 13    Temp:  36.5 C (97.7 F)     SpO2: 95% (!) 88%  91%   Weight:       Height:       BMI:                  Disposition: Discharged    Clinical Impression:   Clinical Impression   Headache, unspecified headache type (Primary)     Follow Up: Alferd,  Lonni, DO  [address]  [place] [id number]  [phone]    In 3 days  As needed    Medications Prescribed:   New Prescriptions    ONDANSETRON  (ZOFRAN  ODT) 4 MG ORAL TABLET, RAPID DISSOLVE    Take 1 Tablet (4 mg total) by mouth Every 8 hours as needed for Nausea/Vomiting    OXYCODONE -ACETAMINOPHEN  (PERCOCET) 5-325 MG ORAL TABLET    Take 1 Tablet by mouth Every 8 hours as needed for Pain   Plan and treatment.  Patient came to emergency room saying that she has migraine headache photophobia and nausea and vomiting.  She was given L of saline, 25 mg Benadryl  IV, Decadron  10 mg IV and Toradol  30 mg IV.  The patient said that this medicine helped a little bit.  Therefore I gave her 0.5 mg of Dilaudid  and after that her headache was completely gone and her CT brain and blood work is all negative.  I discussed results with her and she was discharged home on Percocet and Zofran  as needed.  Follow-up your doctor as needed.      Lennice Meter, MD 01/03/2024, 19:19         [1]   Allergies  Allergen Reactions    Nabumetone Rash     itching   itching   itching   itching    itching   itching    itching    Zyrtec [Cetirizine]      Rash and Blisters    Augmentin [Amoxicillin-Pot Clavulanate]     Macrobid  [Nitrofurantoin  Monohyd/M-Cryst]      Severe cramping     Tramadol  Other Adverse Reaction (Add comment)     headaches   headaches   headaches   headaches    headaches   headaches    headaches    Trazodone     Tylenol -Codeine #4  [Acetaminophen -Codeine]      Dizzy     Erythromycin Diarrhea and  Other Adverse Reaction (Add comment)     cramps   Other reaction(s): Muscle Pain, Other (See Comments)   cramps   cramps    Other reaction(s): Muscle Pain, Other (See Comments)   cramps   cramps    cramps    Gabapentin  Other Adverse Reaction (Add comment) and Nausea/ Vomiting     Increased tremors   tremors   Other reaction(s): Other (See Comments)   Increased tremors   tremors   tremors    Other reaction(s): Other (See Comments)   Increased tremors   tremors   tremors    tremors    Levofloxacin Nausea/ Vomiting     Other reaction(s): Other (See Comments)   GI pain.   GI pain.    Other reaction(s): Other (See Comments)   GI pain.    GI pain.    Nitrofurantoin  Diarrhea and Nausea/ Vomiting    Oseltamivir Diarrhea and Nausea/ Vomiting     Cramps and stomach issues   Other reaction(s): Other (See Comments)   Cramps and stomach issues    Other reaction(s): Other (See Comments)   Cramps and stomach issues    Cramps and stomach issues    Sulfamethoxazole-Trimethoprim Nausea/ Vomiting

## 2024-01-03 NOTE — ED Nurses Note (Signed)

## 2024-02-08 ENCOUNTER — Other Ambulatory Visit: Payer: Self-pay

## 2024-02-08 ENCOUNTER — Ambulatory Visit
Admission: RE | Admit: 2024-02-08 | Discharge: 2024-02-08 | Disposition: A | Discharge status: Home / Self Care | Payer: Self-pay | Source: Ambulatory Visit | Attending: NURSE PRACTITIONER | Admitting: NURSE PRACTITIONER

## 2024-02-08 DIAGNOSIS — F17210 Nicotine dependence, cigarettes, uncomplicated: Secondary | ICD-10-CM | POA: Insufficient documentation

## 2024-02-08 DIAGNOSIS — Z122 Encounter for screening for malignant neoplasm of respiratory organs: Secondary | ICD-10-CM | POA: Insufficient documentation

## 2024-02-17 ENCOUNTER — Other Ambulatory Visit (HOSPITAL_BASED_OUTPATIENT_CLINIC_OR_DEPARTMENT_OTHER): Payer: Self-pay | Admitting: NURSE PRACTITIONER

## 2024-02-17 DIAGNOSIS — F17210 Nicotine dependence, cigarettes, uncomplicated: Secondary | ICD-10-CM

## 2024-02-17 DIAGNOSIS — Z122 Encounter for screening for malignant neoplasm of respiratory organs: Secondary | ICD-10-CM

## 2024-02-17 DIAGNOSIS — R911 Solitary pulmonary nodule: Secondary | ICD-10-CM

## 2024-02-17 DIAGNOSIS — R918 Other nonspecific abnormal finding of lung field: Secondary | ICD-10-CM

## 2024-02-20 ENCOUNTER — Ambulatory Visit (HOSPITAL_BASED_OUTPATIENT_CLINIC_OR_DEPARTMENT_OTHER): Payer: Self-pay | Admitting: NURSE PRACTITIONER

## 2024-06-20 ENCOUNTER — Ambulatory Visit (HOSPITAL_BASED_OUTPATIENT_CLINIC_OR_DEPARTMENT_OTHER): Payer: Self-pay

## 2024-08-07 ENCOUNTER — Ambulatory Visit
